# Patient Record
Sex: Female | Born: 1950
Health system: Southern US, Community
[De-identification: ages and names within clinical notes are randomized; demographics above are authoritative.]

## PROBLEM LIST (undated history)

## (undated) DIAGNOSIS — K219 Gastro-esophageal reflux disease without esophagitis: Secondary | ICD-10-CM

## (undated) DIAGNOSIS — I10 Essential (primary) hypertension: Secondary | ICD-10-CM

## (undated) DIAGNOSIS — R42 Dizziness and giddiness: Secondary | ICD-10-CM

## (undated) DIAGNOSIS — F419 Anxiety disorder, unspecified: Secondary | ICD-10-CM

## (undated) DIAGNOSIS — N289 Disorder of kidney and ureter, unspecified: Secondary | ICD-10-CM

## (undated) DIAGNOSIS — B6790 Echinococcosis, unspecified: Secondary | ICD-10-CM

## (undated) DIAGNOSIS — M75101 Unspecified rotator cuff tear or rupture of right shoulder, not specified as traumatic: Secondary | ICD-10-CM

## (undated) DIAGNOSIS — F32A Depression, unspecified: Secondary | ICD-10-CM

## (undated) DIAGNOSIS — F329 Major depressive disorder, single episode, unspecified: Secondary | ICD-10-CM

## (undated) DIAGNOSIS — H269 Unspecified cataract: Secondary | ICD-10-CM

## (undated) DIAGNOSIS — F319 Bipolar disorder, unspecified: Secondary | ICD-10-CM

## (undated) DIAGNOSIS — M810 Age-related osteoporosis without current pathological fracture: Secondary | ICD-10-CM

## (undated) HISTORY — DX: Unspecified cataract: H26.9

## (undated) HISTORY — DX: Major depressive disorder, single episode, unspecified: F32.9

## (undated) HISTORY — DX: Essential (primary) hypertension: I10

## (undated) HISTORY — DX: Disorder of kidney and ureter, unspecified: N28.9

## (undated) HISTORY — DX: Age-related osteoporosis without current pathological fracture: M81.0

## (undated) HISTORY — PX: BUNIONECTOMY: SHX129

## (undated) HISTORY — DX: Dizziness and giddiness: R42

## (undated) HISTORY — DX: Bipolar disorder, unspecified: F31.9

## (undated) HISTORY — PX: OTHER SURGICAL HISTORY: SHX169

## (undated) HISTORY — DX: Anxiety disorder, unspecified: F41.9

## (undated) HISTORY — PX: CATARACT EXTRACTION: SUR2

## (undated) HISTORY — DX: Echinococcosis, unspecified: B67.90

## (undated) HISTORY — DX: Depression, unspecified: F32.A

## (undated) HISTORY — PX: ROTATOR CUFF REPAIR: SHX139

---

## 1984-06-09 HISTORY — PX: OTHER SURGICAL HISTORY: SHX169

## 1998-10-05 ENCOUNTER — Other Ambulatory Visit: Admission: RE | Admit: 1998-10-05 | Discharge: 1998-10-05 | Payer: Self-pay | Admitting: Obstetrics and Gynecology

## 2000-01-13 ENCOUNTER — Inpatient Hospital Stay (HOSPITAL_COMMUNITY): Admission: EM | Admit: 2000-01-13 | Discharge: 2000-01-14 | Payer: Self-pay | Admitting: Internal Medicine

## 2000-01-13 ENCOUNTER — Encounter: Payer: Self-pay | Admitting: Internal Medicine

## 2001-05-26 ENCOUNTER — Other Ambulatory Visit: Admission: RE | Admit: 2001-05-26 | Discharge: 2001-05-26 | Payer: Self-pay | Admitting: Obstetrics and Gynecology

## 2002-03-31 ENCOUNTER — Encounter: Payer: Self-pay | Admitting: *Deleted

## 2002-03-31 ENCOUNTER — Ambulatory Visit (HOSPITAL_COMMUNITY): Admission: RE | Admit: 2002-03-31 | Discharge: 2002-03-31 | Payer: Self-pay | Admitting: *Deleted

## 2002-06-10 ENCOUNTER — Other Ambulatory Visit: Admission: RE | Admit: 2002-06-10 | Discharge: 2002-06-10 | Payer: Self-pay | Admitting: Obstetrics and Gynecology

## 2003-03-09 ENCOUNTER — Other Ambulatory Visit (HOSPITAL_COMMUNITY): Admission: RE | Admit: 2003-03-09 | Discharge: 2003-03-10 | Payer: Self-pay | Admitting: Psychiatry

## 2003-07-04 ENCOUNTER — Other Ambulatory Visit: Admission: RE | Admit: 2003-07-04 | Discharge: 2003-07-04 | Payer: Self-pay | Admitting: Obstetrics and Gynecology

## 2004-07-04 ENCOUNTER — Other Ambulatory Visit: Admission: RE | Admit: 2004-07-04 | Discharge: 2004-07-04 | Payer: Self-pay | Admitting: Obstetrics and Gynecology

## 2005-04-15 ENCOUNTER — Encounter: Admission: RE | Admit: 2005-04-15 | Discharge: 2005-04-15 | Payer: Self-pay | Admitting: Specialist

## 2005-05-20 ENCOUNTER — Encounter: Admission: RE | Admit: 2005-05-20 | Discharge: 2005-05-20 | Payer: Self-pay | Admitting: Specialist

## 2005-07-07 ENCOUNTER — Other Ambulatory Visit: Admission: RE | Admit: 2005-07-07 | Discharge: 2005-07-07 | Payer: Self-pay | Admitting: Obstetrics and Gynecology

## 2005-08-05 ENCOUNTER — Other Ambulatory Visit: Admission: RE | Admit: 2005-08-05 | Discharge: 2005-08-05 | Payer: Self-pay | Admitting: Obstetrics and Gynecology

## 2006-02-03 ENCOUNTER — Other Ambulatory Visit: Admission: RE | Admit: 2006-02-03 | Discharge: 2006-02-03 | Payer: Self-pay | Admitting: Obstetrics and Gynecology

## 2006-06-27 ENCOUNTER — Encounter: Admission: RE | Admit: 2006-06-27 | Discharge: 2006-06-27 | Payer: Self-pay | Admitting: *Deleted

## 2006-07-16 ENCOUNTER — Other Ambulatory Visit: Admission: RE | Admit: 2006-07-16 | Discharge: 2006-07-16 | Payer: Self-pay | Admitting: Obstetrics and Gynecology

## 2006-09-01 ENCOUNTER — Ambulatory Visit: Payer: Self-pay | Admitting: Internal Medicine

## 2006-09-01 ENCOUNTER — Inpatient Hospital Stay (HOSPITAL_COMMUNITY): Admission: EM | Admit: 2006-09-01 | Discharge: 2006-09-06 | Payer: Self-pay | Admitting: Emergency Medicine

## 2006-09-08 ENCOUNTER — Emergency Department (HOSPITAL_COMMUNITY): Admission: EM | Admit: 2006-09-08 | Discharge: 2006-09-09 | Payer: Self-pay | Admitting: Emergency Medicine

## 2007-09-02 ENCOUNTER — Ambulatory Visit (HOSPITAL_COMMUNITY): Admission: RE | Admit: 2007-09-02 | Discharge: 2007-09-02 | Payer: Self-pay | Admitting: Cardiology

## 2007-09-09 ENCOUNTER — Other Ambulatory Visit: Admission: RE | Admit: 2007-09-09 | Discharge: 2007-09-09 | Payer: Self-pay | Admitting: Obstetrics and Gynecology

## 2008-09-07 ENCOUNTER — Encounter: Payer: Self-pay | Admitting: Obstetrics and Gynecology

## 2008-09-07 ENCOUNTER — Other Ambulatory Visit: Admission: RE | Admit: 2008-09-07 | Discharge: 2008-09-07 | Payer: Self-pay | Admitting: Obstetrics and Gynecology

## 2008-09-07 ENCOUNTER — Ambulatory Visit: Payer: Self-pay | Admitting: Obstetrics and Gynecology

## 2009-03-16 ENCOUNTER — Encounter: Admission: RE | Admit: 2009-03-16 | Discharge: 2009-03-16 | Payer: Self-pay | Admitting: Specialist

## 2009-04-16 ENCOUNTER — Ambulatory Visit (HOSPITAL_COMMUNITY): Admission: RE | Admit: 2009-04-16 | Discharge: 2009-04-17 | Payer: Self-pay | Admitting: Specialist

## 2009-09-10 ENCOUNTER — Ambulatory Visit: Payer: Self-pay | Admitting: Obstetrics and Gynecology

## 2009-09-10 ENCOUNTER — Other Ambulatory Visit: Admission: RE | Admit: 2009-09-10 | Discharge: 2009-09-10 | Payer: Self-pay | Admitting: Obstetrics and Gynecology

## 2009-09-12 ENCOUNTER — Ambulatory Visit: Payer: Self-pay | Admitting: Obstetrics and Gynecology

## 2010-09-11 LAB — BASIC METABOLIC PANEL
BUN: 20 mg/dL (ref 6–23)
Calcium: 9.8 mg/dL (ref 8.4–10.5)
Creatinine, Ser: 1.02 mg/dL (ref 0.4–1.2)
GFR calc non Af Amer: 56 mL/min — ABNORMAL LOW (ref >60)
Glucose, Bld: 99 mg/dL (ref 70–99)
Potassium: 3.1 mEq/L — ABNORMAL LOW (ref 3.5–5.1)

## 2010-09-11 LAB — CBC
Hemoglobin: 13 g/dL (ref 12.0–15.0)
MCHC: 33.8 g/dL (ref 30.0–36.0)
MCV: 90.1 fL (ref 78.0–100.0)
Platelets: 166 10*3/uL (ref 150–400)
RBC: 4.28 MIL/uL (ref 3.87–5.11)
RDW: 12.6 % (ref 11.5–15.5)

## 2010-09-11 LAB — HEPATIC FUNCTION PANEL
ALT: 68 U/L — ABNORMAL HIGH (ref 0–35)
Alkaline Phosphatase: 58 U/L (ref 39–117)
Bilirubin, Direct: 0.1 mg/dL (ref 0.0–0.3)
Indirect Bilirubin: 0.6 mg/dL (ref 0.3–0.9)
Total Bilirubin: 0.7 mg/dL (ref 0.3–1.2)

## 2010-09-20 ENCOUNTER — Encounter (INDEPENDENT_AMBULATORY_CARE_PROVIDER_SITE_OTHER): Payer: Medicare Other | Admitting: Obstetrics and Gynecology

## 2010-09-20 DIAGNOSIS — N952 Postmenopausal atrophic vaginitis: Secondary | ICD-10-CM

## 2010-09-20 DIAGNOSIS — M810 Age-related osteoporosis without current pathological fracture: Secondary | ICD-10-CM

## 2010-09-20 DIAGNOSIS — N951 Menopausal and female climacteric states: Secondary | ICD-10-CM

## 2010-10-25 NOTE — Consult Note (Signed)
NAMELAQUETTA, RACEY NO.:  [id number]   MEDICAL RECORD NO.:  [id number]          PATIENT TYPE:  INP   LOCATION:  6713                         FACILITY:  [facility]   PHYSICIAN:  Antonietta Breach, M.D.  DATE OF BIRTH:  Jul 01, 1950   DATE OF CONSULTATION:  09/04/2006  DATE OF DISCHARGE:                                 CONSULTATION   Kelli Morales is having a slight increase in thought speed with a small  amount of tangentiality.  She is sexually appropriate.  She is not  depressed.  Her mood is within normal limits.  Her concentration is  within normal limits.  She is not having any thoughts of harming herself  or others.  She has intact interests and intact hope.  She has no  hallucinations or delusions.   In reviewing the past psychiatric history, the patient concurs with her  husband's report that the onset of patchy memory and periods of  confusion coincides with the beginning of the medication change  approximately 2 months ago.  Depakote was started at that time.  While  she was on the maintenance regimen of Seroquel 100 mg q.h.s., Klonopin  1.5 mg t.i.d.  Her Topamax and Lamictal 300 mg daily.  She was started  on Depakote at that time.   LABORATORY DATA:  BUN 2, creatinine 0.83, calcium 8.1.  WBC 6.9,  hemoglobin 9.4, platelet count 125.   PHYSICAL EXAMINATION:  VITAL SIGNS:  Temperature 98.3, pulse 83,  respirations 20, blood pressure 94/61, O2 saturation on room air 96%.  MENTAL STATUS:  Kelli Morales is alert.  She is oriented to all spheres.  Her speech involves slight pressure at times during the interview.  Her  memory is intact to immediate, recent, and remote, except for the period  of delirium.  Also, is impaired for periods of time over the past  several weeks.  She does not recall leaving the stove on at home,  although she has insight to understand that that was a period of  anterograde amnesia.  Her judgment is intact.  Thought process and  content as  above.  She is socially appropriate.  Her general psychomotor  tone is within normal limits.   ASSESSMENT:   AXIS I:  1. Bipolar I disorder, mixed, currently stable.  2. Delirium, 293.00, due to medication toxicity.   The undersigned provided extensive ego-supportive psychotherapy and  education.  The indications, alternatives, and adverse effects of  Seroquel, Lamictal, and Klonopin were reviewed including the risk of  Seroquel hyperglycemia and non-reversible movement disorder.  The  discussion also included the risk of a lethal rash with Lamictal and the  risk of dependence with Klonopin.   The patient understands the above information and wants to restart  Seroquel, Lamictal, and Klonopin.   RECOMMENDATIONS:  1. Would restart Seroquel at 100 mg p.o. q.h.s.  Her QTC is currently      below 450 msec.  Would increase the Seroquel by 50 mg daily while      monitoring the QTC to an estimated initial preventative dose of 200  mg q.h.s.  This initial increased dose will be applied as an      additional preventative measure since the Depakote will not be      restarted yet.  Would monitor the QTC while the Seroquel is      increased and if it exceeds 500 msec, would stop the Seroquel.  2. Would restart Lamictal at 25 mg q.a.m. and titrate in the usual      conservative fashion to the estimated preventative dosage of 100 mg      b.i.d.  3. Would restart the Klonopin as a p.r.n. regimen for now.  However,      it is anticipated that she will end up on a standing regimen of      Klonopin for mood stabilization augmentation as well as      antianxiety.  Would not start the Klonopin standing yet in order to      be cautious about inducing any memory and cognitive deficit.  4. Would have this patient see her outpatient psychiatrist during the      first week of discharge.  She is psychiatrically cleared for      discharge.  5. Would also ask the patient to release this dictation and  the other      consultation dictations by the undersigned to her outpatient      psychiatrist for her followup appointment.  6. The patient agrees to call her emergency number immediately for any      distress, clouding of consciousness, thoughts of harming herself or      others.  Her husband is also aware of emergency services for any      side effects or symptoms that re-develop.  7. Regarding the patient's Topamax, please see the previous discussion      and the initial consult.  The Topamax may be necessary for migraine      prevention.  However, would not re-add it yet given that the exact      combination of medications that synergized to put this patient in      delirium is still unclear.  Would set this patient up with      neurology followup during her first week of discharge.  8. Regarding additional mood stabilization therapy, there are      alternative regimens for prevention for this patient that can be      pursued by her outpatient psychiatrist.  Seroquel is an ideal agent      to adjust acutely in the short run while a more definitive, first      line, long term mood stabilization therapy can be developed,      eventually allowing the reduction of the Seroquel back to the      lowest effective preventative dose.      Antonietta Breach, M.D.  Electronically Signed     JW/MEDQ  D:  09/05/2006  T:  09/05/2006  Job:  [id number]

## 2010-10-25 NOTE — Consult Note (Signed)
Kelli, Morales NO.:  [id number]   MEDICAL RECORD NO.:  [id number]          PATIENT TYPE:  INP   LOCATION:  2108                         FACILITY:  [facility]   PHYSICIAN:  Antonietta Breach, M.D.  DATE OF BIRTH:  13-Jun-1950   DATE OF CONSULTATION:  09/02/2006  DATE OF DISCHARGE:                                 CONSULTATION   REQUESTING PHYSICIAN:  Charlcie Cradle. Delford Field, MD, FCCP   REASON FOR CONSULTATION:  Overdose, history of bipolar disorder by  record, delirium, rule out intentional overdose, evaluation and  management.   HISTORY OF PRESENT ILLNESS:  Kelli Morales is a 60 year old female  admitted to [facility] on September 01, 2006, due to severe stupor  leading to obtundation.  The husband had found the patient unresponsive  on the couch with vomit in her mouth labored breathing.  She required  intubation.   After being brought into the hospital, the patient has continued with  paranoia.  She also has impaired short-term memory and disorientation to  all spheres.  Her mood is depressed.  Her concentration is poor.  She is  easily distractible.  She has cloudiness of consciousness.   The patient's psychotropic medication as an outpatient has currently  been Seroquel 100 mg nightly, Klonopin 1-1/2 tablets b.i.d., Lamictal  200 mg q.a.m. and 100 mg nightly, Ambien 10 mg nightly, Depakote 1000 mg  nightly, Topamax 150 mg daily.  The Topamax has been for her headaches.   PAST PSYCHIATRIC HISTORY:  The patient was not able to provide any  history.  The history was taken from the patient's husband.  He reports  that she was diagnosed with systemic lupus erythematosus a number of  years ago.  This resulted in her having to leave her job.  She went  through a period of several weeks of irritability as well as paranoia.  He denies that she had any increased energy or decreased need for sleep.   She was treated with psychotropic medication and eventually  stabilized,  however, he states that her memory has always been patchy since that  time.   She does not have a history of psychiatric admissions or suicide  attempts.  He does not know of any history of hallucinations.   There has been a progression in her mental status abnormalities over the  past 6 weeks, particularly in the area of memory.  Approximately 6 weeks  ago, her medications were changed.  The husband cannot be specific at  this time about which ones were changed and it is unclear if this is  simply a correlation.  However, at that time, she began to not make  sense at times with her thought process.  She would get letters of words  mixed up when she would make a shopping list.  She also began to have  significant and severe short-term memory impairment.  A number of times,  she has left the stove on.  When she has tried to cook a dinner  recently, she got the ingredients all mixed up.   Also, over the  past 3 weeks, she has had a high amount of irrational  anger and paranoia thinking that others are against her.  She has been  stating that people in her life are trying to control her including her  mother, her mother-in-law and her husband.  She has also accused her  husband of adultery when there were no indications in real life that  this was occurring.   The patient did see her psychiatrist just before admission.  Her husband  reports that the psychiatrist stated that she was taking her medicine  incorrectly.  Evidently, the medicines reported above were not being  taken according to the psychiatrist's recommended regimen.   FAMILY PSYCHIATRIC HISTORY:  None known.   SOCIAL HISTORY:  Kelli Morales has a daughter.  She is married.  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  She does not use any alcohol or illegal drugs.  Her religion is  [employer] traffic [employer].   PAST MEDICAL HISTORY:  1. Systemic lupus erythematosus.  2. Delirium.  3.  Gastroesophageal reflux disease.  4. History of migraine headaches.  5. History of asthma.  6. Allergic rhinitis.   MEDICATIONS:  The MAR is reviewed.  The patient is not currently on any  psychotropic medication other than Ativan 1-4 mg IV q.1 h. p.r.n.  agitation.   ALLERGIES:  NO KNOWN DRUG ALLERGIES.   LABORATORY DATA AND X-RAY FINDINGS:  WBC is 7.3, hemoglobin 10.4,  platelet count 94.  ESR 2.  INR 1.3.  Metabolic panel shows a sodium at  146, potassium 3, chloride 116, glucose 90, BUN 5, creatinine 0.81.  SGOT 24, SGPT 17, total protein 4.8, albumin 2.3, calcium 7.6, magnesium  2.1.  Urine pregnancy negative.  Tylenol negative.  Aspirin negative.  Her Depakote level was 48.1.  Urine drug screen is positive for  tricyclic compounds as well as benzodiazepines.  Alcohol was negative.  Rheumatoid factor less than 20 which is within normal limits.  Antinuclear antibody negative.  Streptococcus pneumoniae negative.   Head CT without contrast on March 25, showed no acute intracranial  findings.   REVIEW OF SYSTEMS:  CONSTITUTIONAL:  There is a fever of 102.4 at the  time of exam.  NEUROLOGIC:  Unremarkable.  PSYCHIATRIC:  As above.  CARDIOVASCULAR:  No chest pain.  The patient's QTC on telemetry  currently is 410.  RESPIRATORY:  No current coughing or wheezing.  The  patient is on 4 L of oxygen nasal cannula.  GASTROINTESTINAL:  No  nausea, vomiting, diarrhea.  GENITOURINARY:  No known dysuria.  SKIN:  Unremarkable.  MUSCULOSKELETAL:  No deformities, weaknesses or atrophy.  ENDOCRINE/METABOLIC:  As above.  HEMATOLOGIC/LYMPHATIC:  Mild anemia,  thrombocytopenia.   PHYSICAL EXAMINATION:  VITAL SIGNS:  Temperature 102.4, pulse 87 blood  pressure 118/60, O2 saturation on 4 L nasal cannula oxygen 100%.   MENTAL STATUS EXAM:  Kelli Morales is a well-groomed, middle-aged female  partially reclined in a supine position in her ICU bed with clouding of consciousness.  Her orientation is  only to self.  Her memory involves  3/3 immediate, 0/3 on recall.  Thought process involves partial  disorganization.  Thought content:  She states that she is being  controlled by her mother, her mother-in-law and her husband.  She has no  current hallucinations.  There are no thoughts of harming herself or  others.  Her affect is constricted.  Her mood is sad.  Judgment is  impaired.  Insight is poor.  Concentration is poor with easy  distractibility.  Speech involves slightly flat prosody and slightly  slowed rate.  Her fund of knowledge and intelligence are below that of  her estimated premorbid baseline.  Her general psychomotor tone is  mildly slowed.   ASSESSMENT:  AXIS I:  1.  (293.00) Delirium, not otherwise specified.  1. Rule out (293.83) mood disorder, not otherwise specified.  The      patient's husband does not give a history consistent with a      hypomanic her manic period.  However, this diagnosis will be left      provisional.  It does appear that the patient has experience a      history of functional depressive symptoms as well as paranoia.      However, the organic type findings are reported by the husband as      occurring over several weeks.  AXIS II:  Deferred.  AXIS III:  See general medical problems.  AXIS IV:  General medical.  AXIS V:  20.   DISCUSSION:  Looking over the patient's preadmission psychotropics, a  number of the medicines could cause delirium if not taken in the correct  dosages, particularly if incorrect dosages were combining together in  producing a delirium.   Topamax has particularly been known to cause memory impairment as well  as psychosis in a small percentage of patients.  Klonopin obviously can  impair memory.  Also, Ambien can induce a delirium condition.  The  patient was also on Flexeril and it has anticholinergic side effects  which can lower the delirium threshold.  Of note, also the patient's  albumin is currently below  normal limits on two separate laboratory  assessments indicating that the patients drug binding in the serum for a  number of agents would be inadequate resulting in potential toxicity at  normal dosing.   RECOMMENDATIONS:  1. Concur with discontinuing all psychotropics as possible at this      time except for a psychotropic for severe agitation.  2. Concur with Ativan 1-4 mg q.1 h. p.r.n. agitation.  3. If confusion and paranoia continue, would start Haldol 0.5 mg p.o.      or slow push IV b.i.d. while monitoring her QTC.  Psychiatry will      follow the patient's routine psychotropic regimen.  Will have to be      developed as her constitutional status improves.      Antonietta Breach, M.D.  Electronically Signed     JW/MEDQ  D:  09/02/2006  T:  09/03/2006  Job:  [id number]

## 2010-10-25 NOTE — Op Note (Signed)
NAMEAIMY, SWEETING NO.:  [id number]   MEDICAL RECORD NO.:  [id number]          PATIENT TYPE:  OIB   LOCATION:  2899                         FACILITY:  [facility]   PHYSICIAN:  Armanda Magic, M.D.     DATE OF BIRTH:  1950-09-11   DATE OF PROCEDURE:  DATE OF DISCHARGE:  09/02/2007                               OPERATIVE REPORT   REFERRING PHYSICIAN:  Dr. Santiago Glad   PROCEDURE:  Tilt table test.   OPERATOR:  Armanda Magic, M.D.   INDICATIONS:  Syncope.   HISTORY OF PRESENT ILLNESS:  This is a 60 year old female who has had  syncope.  She was evaluated by Dr. Verdis Prime.  She does have a history  of bipolar disorder, is on multiple medications.  She also has a history  of migraine headaches.  She is now here for tilt table testing to rule  out vasovagal or orthostatic hypotension as a cause for syncope.   The patient was brought to the cardiac catheterization laboratory in a  fasting, nonsedated state.  Informed consent was obtained.  The patient  was connected to continuous heart rate and pulse oximetry monitoring and  intermittent blood pressure monitoring.  The patient was placed supine  and baseline blood pressure was 108/54 with a heart rate of 55.  The  patient was then tilted upright for a total of 30 minutes.  There were  no significant changes noted in her blood pressure during the upright  tilt.  Her lowest blood pressure during upright tilt was 98/67 mmHg.  Average heart rate was in the 60s.  She was then placed supine and  started on Isuprel drip 0.5 mcg/kg and then increased to a total of 2  mcg/kg to attain increase in heart rate of 20% from baseline.  The  patient was then tilted back up for a total of 10 minutes on Isuprel  drip.  Lowest blood pressure achieved during that was 91/34 mmHg.  She  had no symptoms.  She was subsequently placed supine and later  discharged home.   IMPRESSION:  1. Syncope of unclear etiology.  2. Negative tilt  table test for vasovagal orthostatic hypotension.      Blood pressures did drop in the low 90 systolic,      but she only started up with a blood pressure of 108 systolic to      begin with.  She was completely asymptomatic.   Recommendations:  Refer to workup per Dr. Verdis Prime.      Armanda Magic, M.D.  Electronically Signed     TT/MEDQ  D:  09/23/2007  T:  09/24/2007  Job:  [id number]   cc:   Lyn Records, M.D.  Santiago Glad

## 2010-10-25 NOTE — Discharge Summary (Signed)
Kelli Morales, Kelli Morales                ACCOUNT NO.:  [id number]   MEDICAL RECORD NO.:  [id number]          PATIENT TYPE:  INP   LOCATION:  6713                         FACILITY:  [facility]   PHYSICIAN:  Kela Millin, M.D.DATE OF BIRTH:  26-Dec-1950   DATE OF ADMISSION:  09/01/2006  DATE OF DISCHARGE:  09/06/2006                               DISCHARGE SUMMARY   DISCHARGE DIAGNOSES:  1. Accidental drug overdose.  2. Ventilator-dependent respiratory failure, likely secondary to acute      lung injury due to aspiration.  3. Staphylococcus aureus pneumonia with systemic inflammatory response      syndrome/hypotension.  4. Hypokalemia - resolved.  5. Delirium, not otherwise specified.  6. Bipolar 1 disorder, mixed - Per psychiatrist, currently stable.   PROCEDURES STUDIES:  1. Left internal jugular central line placed on September 01, 2006,      removed September 04, 2006.  2. Endotracheal intubation on September 01, 2006, extubated September 02, 2006.  3. Arterial line placed September 01, 2006, and removed September 02, 2006.  4. CT scan of head - no acute intracranial findings.   HISTORY:  The patient is a pleasant 60 year old black female with a past  medical history significant for bipolar disorder, asthma, systemic  lupus, on multiple medications, who presented to the [facility] ER with  altered mental status and respiratory distress.  Per family, she had  been confused for the past couple of months and lethargic.  It was noted  that patient is followed by psychiatrist - Dr. Jennelle Morales and the day prior  to admission had seen him and he decided to decrease her Seroquel,  Klonopin and Lamictal doses.  The patient's husband woke up on the  morning of admission and found her unresponsive on the couch with  vomitus in her mouth and her breathing was labored.  She was intubated  per EMS and brought to [facility].  The patient was admitted to the  Critical Care Service.   Her physical exam upon admission, as  per Dr. Frederico Morales, revealed a  temperature of 93 with a blood pressure of 76/41, pulse of 59, O2 sat of  100%.  GENERAL:  She was sedated and on the vent.  PERTINENT FINDINGS ON EXAM:  CARDIOVASCULAR:  Bradycardic, regular rhythm.  EXTREMITIES:  No edema.  NEUROLOGIC AND PSYCHOLOGIC:  Limited because patient was intubated and  sedated.   LABORATORY DATA:  Her sodium was 136, potassium of 2.4, chloride 100,  bicarbonate of 27, BUN 13, creatinine of 1.2, glucose 129, hemoglobin of  11.8, white cell count of 7.1, platelet count 90,000, neutrophil count  83, alcohol level less than 5.  Urinalysis negative for infection.  Urine drug screen positive for benzodiazepines.  Urine tricyclic screen  positive.  ABG showed a pH of 7.42, pCO2 of 34.2, pO2 of 260,  bicarbonate of 22.3, O2 sat of 100% and the FIO2 100%, valproic acid  level 48.1.  Urine pregnancy test negative.  Chest x-ray showed airspace  disease bilaterally, right greater than left, and predominantly  perihilar.   HOSPITAL  COURSE:  1. Ventilator-dependent respiratory failure, likely secondary to acute      lung injury due to aspiration - The patient was admitted to the      Critical Care Service.  She was started on ARDS protocol with a      goal tidal volume of 6 mL/kg and her FIO2 titrated to maintain O2      sats of greater than 92%.  The patient was placed on albuterol and      Atrovent nebulizers.  She was also placed on empiric broad spectrum      antibiotics.  Bronchial alveolar lavage was done with patient on      the vent and the sputum sent for cultures and it grew Staph aureus,      her antibiotics were adjusted as appropriate.  The patient      responded well to this intervention and was weaned per protocol and      on March 26 was extubated.  She tolerated this well and stayed off      the ventilator.  She was subsequently transferred to a medical      floor bed and has been oxygenating well on room air.  Followup       chest x-ray showed improving infiltrates.  2. Accidental overdose - Following extubation, Psychiatry was      consulted and Dr. Jeanie Morales saw the patient.  He agreed with Ativan      p.r.n. agitation and also agreed with eliminating as many      psychotropics as possible initially.  He subsequently followed up      with the patient and restarted Lamictal at a lower dose of 25 mg a      day and also the Seroquel at 100 mg q.h.s. and Klonopin 1 mg t.i.d.      p.r.n.  An EKG was done after the patient was placed back on      Seroquel to assess the QT interval and this was less than 500-449.      Dr. Jeanie Morales also recommended increasing the Seroquel by 50 mg      daily while monitoring the QTc to an estimated initial preventative      dose of 200 mg q.h.s.  The patient is discharged on 150 mg of      Seroquel and the dose to be increased by 50 on followup with his      psychiatrist with EKG monitoring - QTc interval as recommended.      Dr. Jeanie Morales explained that this initial increased dose will be      applied as an additional preventative measure since the Depakote      will not be restarted yet.  He also indicated that if the QTc      interval exceed 500 milliseconds the Seroquel should be stopped.      He also, as already indicated, restarted the Lamictal 25 mg daily      and indicated that it be titrated in the usual conservative fashion      to an estimated preventative dosage of 100 mg p.o. b.i.d., the      patient is discharged on 25 mg and is to follow up with the      psychiatrist - she is to call for an appointment in the morning and      the psychiatrist will titrate this Lamictal.  Also, the clonidine      was started p.r.n. and he indicated that  he did not recommend      starting scheduled Klonopin yet, in order to be cautious about      inducing any memory or cognitive deficit.  The patient is to follow     up with the outpatient psychiatrist as already discussed, Dr.       Jennelle Morales, the first week of discharge and Dr. Jeanie Morales cleared the      patient for discharge.  The psychiatrist also recommended stopping      the Topamax, and the patient is to follow up with her neurologist -      is to call in a.m. for appointment.  Ms. Wemhoff' mental status has      improved and on rounds today her family indicates that she is at      about her baseline at this time and she will be discharged for      outpatient followup.  3. Staph aureus pneumonia - Patient had a chest x-ray done and the      results are as stated above, she was started on empiric antibiotics      on admission and a BAL was done and the cultures grew Staph aureus      which was sensitive to oxacillin.  The patient has remained      afebrile.  Followup chest x-ray has shown improvement in the      infiltrates.  She is oxygenating well on room air and will be      discharged on oral antibiotics and is to follow up with her primary      care physician.  4. Bipolar disorder/delirium - Improved, patient was followed by      Psychiatry, as discussed above, while in the hospital.  5. History of asthma - Patient is to continue her bronchodilators as      well as Singulair, Flonase and Zyrtec upon discharge.  6. Hypokalemia - Resolved.  The patient's last potassium prior to      discharge was 3.5.   DISCHARGE MEDICATIONS:  1. Lamictal 25 mg one p.o. daily - dose to be titrated per      psychiatrist as discussed above.  2. Seroquel 150 mg p.o. q.h.s., dose also to be titrated per      psychiatrist with monitoring of the QTc interval to a dose of 200      mg (patient now off Depakote as discussed above).  3. Augmentin 875 mg one p.o. b.i.d. for 1 week.  4. Klonopin 1 mg p.o. t.i.d. p.r.n.  5. Patient to continue Aciphex, Advair, ProAir, Zyrtec, multivitamin,      Singulair, calcium, Flonase, ibuprofen/Tylenol p.r.n., Phenergan      p.r.n.  6. As discussed above, discontinued Depakote, Topamax,  tizanidine,      Flexeril.  7. Patient is to hold off Maxzide until blood pressures rechecked upon      followup with her primary care physician next week, patient is to      call for an appointment in the a.m.  The patient's blood pressures      have been mostly systolic 106-111 off Maxzide while in the      hospital.   FOLLOWUP CARE:  1. Dr. Jennelle Morales next week, patient to call for appointment in a.m.  2. Neurologist, patient to call for appointment in a.m. for an      appointment next week.  3. Dr. Laurann [place] next week, patient to call for appointment.   DISCHARGE CONDITION:  Improved/stable.  Kela Millin, M.D.  Electronically Signed     ACV/MEDQ  D:  09/06/2006  T:  09/06/2006  Job:  [id number]   cc:   Stacie Acres. Cliffton Asters, M.D.  [facility]  Jetty Duhamel., M.D.

## 2010-10-25 NOTE — H&P (Signed)
NAMEJAYCI, ELLEFSON NO.:  [id number]   MEDICAL RECORD NO.:  [id number]          PATIENT TYPE:  INP   LOCATION:  1823                         FACILITY:  [facility]   PHYSICIAN:  Vanetta Mulders, MD         DATE OF BIRTH:  Jan 21, 1951   DATE OF ADMISSION:  09/01/2006  DATE OF DISCHARGE:                              HISTORY & PHYSICAL   HISTORY OF PRESENT ILLNESS:  Mrs. Hardgrave is a 60 year old lady with a  past medical history significant for bipolar disorder, systemic lupus  erythematosus diagnosed in 2001, on polypharmacia at home who presented  to [facility] Emergency Room for altered mental status and  respiratory distress.  Patient has been lethargic and confused for the  past couple of months, per husband's history.  She is seeing a  psychiatrist, Dr. Chancy Hurter, and patient states that the day before  admission patient has seen her psychiatrist and he decided to decrease  her medication her Seroquel, her Klonopin, and Lamictal.  The night  prior to admission the patient went to the living room to watch TV.  In  the morning of admission patient's husband woke up and found her  unresponsive on the couch with vomit on her mouth and having laborious  breathing.  She was breathing on her own, and she had a pulse.  He  called EMS.  Because of respiratory distress EMS intubated the patient  in the field and brought her into [facility].   ALLERGIES:  Unknown.   PAST MEDICAL HISTORY:  1. Significant for bipolar disorder.  2. Migraine headaches.  3. Systemic lupus erythematosus.  4. GERD.  5. Asthma.  6. Allergic rhinitis.   MEDICATIONS:  1. Seroquel 100 mg at bedtime.  2. Klonopin 1-1/2 tablets p.o. b.i.d.  3. Lamictal 200 mg daily and 100 mg a night.  This was changed to 1-      1/2 daily either a.m. or p.m.  4. Ambien 10 mg at bedtime.  This was stopped the day prior to      admission.  5. Maxzide 75/50 p.o. daily.  6. AcipHex 20 mg daily.  7.  Advair 500/50 one inhaled in the a.m. and one in the p.m.  8. Zyrtec 10 mg p.o. daily.  9. Singulair 10 mg p.o. nightly.  10.Multivitamin 2 tablets daily.  11.Calcium 600 Plus vitamin D 200 p.o. b.i.d.  12.ProAir HFA 2 puffs q.4 hours p.r.n.  13.Flonase 1 spray each nostril every 12 hours p.r.n.  14.Aspirin 500 mg p.o. q.6 hours p.r.n.  15.Tylenol Extra Strength 500 mg p.o. q.6 hours p.r.n.  16.Ibuprofen 800 mg p.o. t.i.d. for back pain p.r.n.  17.Flexeril using the _______.  18.Tizanidine 4 mg p.o. daily.  Can be taken as often as 4 times a      day.  19.Depakote 500 mg 2 tablets at bedtime.  20.Topamax 150 mg tablets taken for migraine headaches at bedtime.  21.Promethazine 25 mg p.o. nightly p.r.n. with migraine headaches.  22.Stool softener 2 p.r.n.  23.Soy balance five 50 mg caplets p.o. b.i.d.  24.Green  tea 1 cup twice daily.  25.Hoodia capsules as diet aid t.i.d.   FAMILY HISTORY, SOCIAL HISTORY AND REVIEW OF SYSTEMS:  Cannot be  obtained.  Patient was intubated and sedated.   PHYSICAL EXAMINATION:  VITAL SIGNS:  Showed a temperature of 93.  Blood  pressure of 76/41.  Heart rate of 59.  O2 saturating of 100%.  GENERAL:  Patient was sedated, on the vent.  EYES:  PERRLA.  Extraocular movement could not be assessed.  CARDIOVASCULAR:  Bradycardia.  Regular respiratory force when breathing.  ABDOMINAL:  Bowel sounds negative but abdomen was soft, nontender.  No  rigidity.  EXTREMITIES:  No edema.  NEUROLOGIC:  Could not be assessed.  Patient withdrawing from painful  stimuli.  PSYCHOLOGICALLY:  Could not be assessed.  Patient intubated and sedated.   LABS ON ADMISSION:  Showed a sodium of 136. Potassium 2.4.  Chloride  100.  Bicarbonate 27.  BUN 13.  Creatinine 1.2.  Glucose 129.  Hemoglobin 11.8.  White count of 7.1.  Platelets 90 with percentage of  neutrophils 83.  Alcohol level of less than 5.  Urine was yellow, with a  specific gravity of 1.017, a pH of 6, glucose  negative, ketones 15,  protein negative, nitrite negative, leukocyte esterase negative.  Urine  drug screen was positive for benzodiazepines.  Urine tricyclic screen  was positive.  ABG showed a pH of 7.422.  PCO2 of 34.2.  PO2 of 260.  Bicarbonate of 22.3.  O2 sat 100% on FiO2 of 100%.  Valproic acid level  was 48.1 which is low.  Urine pregnancy test was negative.  Chest x-ray  showed air-space disease bilaterally, right more than left, and  predominantly perihilar.   ASSESSMENT AND PLAN:  1. Ventilator-dependent respiratory failure most likely secondary to      acute lung injury secondary to aspiration.  Patient was started on      ARDS protocol with the goal of tidal volume of 6 mL per kilogram      and respiratory therapy which was ordered to decrease the FiO2 in      order to obtain an O2 sat of more than 92%.  Patient was started on      albuterol nebulizers and Atrovent around the clock and p.r.n.      Patient was started also on antibiotics for aspiration pneumonia      and azithromycin and Zosyn.  2. Shock most likely secondary to sepsis and acute stress with      aspiration pneumonia.  Patient received aggressive fluids,      resuscitation in the emergency room approximately 4 liters of      fluids.  Her blood pressure improved significantly.  She did not      require pressors.  She has a p.r.n. order to start Levophed if      necessary.  If fluid resuscitation does not achieve the goals of      therapy, which are a arterial pressure more than 65 and the CVP      more than 10.  3. Hypokalemia.  I am unsure of etiology.  Most likely it is secondary      to poor p.o. intake.  I will check magnesium level as well and I      will repeat potassium and recheck her level in the afternoon.  4. Hypothermia most likely secondary to sepsis and septic shock.      Patient is currently on bear-hugger.  I am  sure she will improve     with aggressive resuscitation measures.  5.  Bradycardia.  It is most likely related to previous problem      hypokalemia, hypothermia and shock.  We will continue to monitor      patient.  6. Altered mental status, most likely secondary to medication and      sepsis.  In the differential I would also include lupus vasculitis.      Patient will have a CT of her head done today and will follow up on      the results.  Now patient is on the ventilator on sedation      protocol.  We will try to wean her oxidation and extubate as soon      as possible.  At that time we will reassess her mental status.  7. Bipolar disorder, on multiple medications.  Followed by Dr. Chancy Hurter.      For now we will hold patient's medications Seroquel, Klonopin and      lamotrigine, and we will continue sedation medication, fentanyl,      and Versed.  We will try to wean her off fentanyl and Versed and      likely restart her home medication at the smaller dose.  We will      consult psychiatry for medication reconsideration in the morning.  8. Migraine headaches.  For now we will hold medications.  Valproic      acid level is low.  We will continue to monitor.  9. GERD.  We will continue Protonix.  10.She has systemic lupus erythematosus.  We will check today an ANA,      double-stranded DNA, rheumatoid factor, ESR, and a CT of the head      to evaluate for vasculitis.  11.Hyperglycemia.  The patient was started on hyperglycemia protocol.  12.Prophylaxis.  Patient was started on Lovenox for DVT prophylaxis.      Vanetta Mulders, MD     DA/MEDQ  D:  09/01/2006  T:  09/01/2006  Job:  [id number]

## 2011-03-11 ENCOUNTER — Telehealth: Payer: Self-pay | Admitting: *Deleted

## 2011-03-11 NOTE — Telephone Encounter (Signed)
Patient informed note below.  She is satisfied with answer.  Will discuss further at annual exam.

## 2011-03-11 NOTE — Telephone Encounter (Signed)
Patient wants to know what your opinion is on soy supplements including side effects from using them.

## 2011-03-11 NOTE — Telephone Encounter (Signed)
That is really not a question I can answer completely on the phone. I think soy supplements to help symptoms. I think it's unlikely there is any danger with them if used in moderation. There is however no scientific data. If she wants more information I suggest she come and see me.

## 2011-07-07 DIAGNOSIS — J309 Allergic rhinitis, unspecified: Secondary | ICD-10-CM | POA: Diagnosis not present

## 2011-07-07 DIAGNOSIS — J45909 Unspecified asthma, uncomplicated: Secondary | ICD-10-CM | POA: Diagnosis not present

## 2011-07-07 DIAGNOSIS — D649 Anemia, unspecified: Secondary | ICD-10-CM | POA: Diagnosis not present

## 2011-07-07 DIAGNOSIS — I10 Essential (primary) hypertension: Secondary | ICD-10-CM | POA: Diagnosis not present

## 2011-07-07 DIAGNOSIS — N289 Disorder of kidney and ureter, unspecified: Secondary | ICD-10-CM | POA: Diagnosis not present

## 2011-07-14 DIAGNOSIS — M2010 Hallux valgus (acquired), unspecified foot: Secondary | ICD-10-CM | POA: Diagnosis not present

## 2011-07-17 DIAGNOSIS — M2010 Hallux valgus (acquired), unspecified foot: Secondary | ICD-10-CM | POA: Diagnosis not present

## 2011-07-28 DIAGNOSIS — N2581 Secondary hyperparathyroidism of renal origin: Secondary | ICD-10-CM | POA: Diagnosis not present

## 2011-07-28 DIAGNOSIS — I129 Hypertensive chronic kidney disease with stage 1 through stage 4 chronic kidney disease, or unspecified chronic kidney disease: Secondary | ICD-10-CM | POA: Diagnosis not present

## 2011-07-28 DIAGNOSIS — N269 Renal sclerosis, unspecified: Secondary | ICD-10-CM | POA: Diagnosis not present

## 2011-07-29 DIAGNOSIS — I10 Essential (primary) hypertension: Secondary | ICD-10-CM | POA: Diagnosis not present

## 2011-07-29 DIAGNOSIS — M2010 Hallux valgus (acquired), unspecified foot: Secondary | ICD-10-CM | POA: Diagnosis not present

## 2011-08-04 DIAGNOSIS — M2010 Hallux valgus (acquired), unspecified foot: Secondary | ICD-10-CM | POA: Diagnosis not present

## 2011-08-14 DIAGNOSIS — F3177 Bipolar disorder, in partial remission, most recent episode mixed: Secondary | ICD-10-CM | POA: Diagnosis not present

## 2011-08-18 ENCOUNTER — Other Ambulatory Visit: Payer: Self-pay | Admitting: Cardiology

## 2011-08-21 ENCOUNTER — Encounter (INDEPENDENT_AMBULATORY_CARE_PROVIDER_SITE_OTHER): Payer: Medicare Other

## 2011-08-21 DIAGNOSIS — I10 Essential (primary) hypertension: Secondary | ICD-10-CM | POA: Diagnosis not present

## 2011-08-22 DIAGNOSIS — Z1231 Encounter for screening mammogram for malignant neoplasm of breast: Secondary | ICD-10-CM | POA: Diagnosis not present

## 2011-08-22 DIAGNOSIS — M949 Disorder of cartilage, unspecified: Secondary | ICD-10-CM | POA: Diagnosis not present

## 2011-08-27 ENCOUNTER — Encounter: Payer: Self-pay | Admitting: Obstetrics and Gynecology

## 2011-08-28 ENCOUNTER — Encounter: Payer: Self-pay | Admitting: Obstetrics and Gynecology

## 2011-09-01 DIAGNOSIS — M2010 Hallux valgus (acquired), unspecified foot: Secondary | ICD-10-CM | POA: Diagnosis not present

## 2011-09-08 ENCOUNTER — Other Ambulatory Visit (HOSPITAL_COMMUNITY): Payer: Self-pay | Admitting: Nephrology

## 2011-09-08 DIAGNOSIS — I10 Essential (primary) hypertension: Secondary | ICD-10-CM

## 2011-09-08 DIAGNOSIS — N261 Atrophy of kidney (terminal): Secondary | ICD-10-CM

## 2011-09-15 ENCOUNTER — Encounter: Payer: Self-pay | Admitting: Gynecology

## 2011-09-15 DIAGNOSIS — J45909 Unspecified asthma, uncomplicated: Secondary | ICD-10-CM | POA: Insufficient documentation

## 2011-09-15 DIAGNOSIS — F319 Bipolar disorder, unspecified: Secondary | ICD-10-CM | POA: Insufficient documentation

## 2011-09-15 DIAGNOSIS — G43909 Migraine, unspecified, not intractable, without status migrainosus: Secondary | ICD-10-CM | POA: Insufficient documentation

## 2011-09-15 DIAGNOSIS — B6790 Echinococcosis, unspecified: Secondary | ICD-10-CM | POA: Insufficient documentation

## 2011-09-16 ENCOUNTER — Other Ambulatory Visit (HOSPITAL_COMMUNITY): Payer: Medicare Other

## 2011-09-16 ENCOUNTER — Encounter (HOSPITAL_COMMUNITY)
Admission: RE | Admit: 2011-09-16 | Discharge: 2011-09-16 | Disposition: A | Discharge status: Home / Self Care | Payer: Medicare Other | Source: Ambulatory Visit | Attending: Nephrology | Admitting: Nephrology

## 2011-09-16 DIAGNOSIS — N261 Atrophy of kidney (terminal): Secondary | ICD-10-CM

## 2011-09-16 DIAGNOSIS — N269 Renal sclerosis, unspecified: Secondary | ICD-10-CM | POA: Diagnosis not present

## 2011-09-16 DIAGNOSIS — N289 Disorder of kidney and ureter, unspecified: Secondary | ICD-10-CM | POA: Diagnosis not present

## 2011-09-16 DIAGNOSIS — I10 Essential (primary) hypertension: Secondary | ICD-10-CM | POA: Diagnosis not present

## 2011-09-16 MED ORDER — TECHNETIUM TC 99M MERTIATIDE
15.5000 | Freq: Once | INTRAVENOUS | Status: AC | PRN
  Administered 2011-09-16: 16 via INTRAVENOUS

## 2011-09-23 ENCOUNTER — Ambulatory Visit (INDEPENDENT_AMBULATORY_CARE_PROVIDER_SITE_OTHER): Payer: Medicare Other | Admitting: Obstetrics and Gynecology

## 2011-09-23 ENCOUNTER — Encounter: Payer: Self-pay | Admitting: Obstetrics and Gynecology

## 2011-09-23 ENCOUNTER — Other Ambulatory Visit (HOSPITAL_COMMUNITY)
Admission: RE | Admit: 2011-09-23 | Discharge: 2011-09-23 | Disposition: A | Discharge status: Home / Self Care | Payer: Medicare Other | Source: Ambulatory Visit | Attending: Obstetrics and Gynecology | Admitting: Obstetrics and Gynecology

## 2011-09-23 VITALS — BP 110/70 | Ht 65.0 in | Wt 135.0 lb

## 2011-09-23 DIAGNOSIS — Z124 Encounter for screening for malignant neoplasm of cervix: Secondary | ICD-10-CM | POA: Diagnosis not present

## 2011-09-23 DIAGNOSIS — N390 Urinary tract infection, site not specified: Secondary | ICD-10-CM | POA: Diagnosis not present

## 2011-09-23 DIAGNOSIS — N289 Disorder of kidney and ureter, unspecified: Secondary | ICD-10-CM | POA: Insufficient documentation

## 2011-09-23 DIAGNOSIS — M810 Age-related osteoporosis without current pathological fracture: Secondary | ICD-10-CM | POA: Diagnosis not present

## 2011-09-23 DIAGNOSIS — N952 Postmenopausal atrophic vaginitis: Secondary | ICD-10-CM | POA: Diagnosis not present

## 2011-09-23 DIAGNOSIS — F329 Major depressive disorder, single episode, unspecified: Secondary | ICD-10-CM | POA: Insufficient documentation

## 2011-09-23 DIAGNOSIS — I10 Essential (primary) hypertension: Secondary | ICD-10-CM | POA: Insufficient documentation

## 2011-09-23 DIAGNOSIS — R351 Nocturia: Secondary | ICD-10-CM

## 2011-09-23 NOTE — Progress Notes (Signed)
Patient came to see me today for further followup. She has had deterioration of her renal function with some shrinkage of her right kidney. She is in the middle of a workup with the nephrologist. We have previously treated her for osteoporosis with biphosphonate's. She found it very hard to comply with having an empty stomach prior to taking her medicine so she has been on drug holiday for 2 years. Her bone density done last month showed her worse T score of -2.4. Since 2011 there is been statistical significant decrease in bone density hip and spine. She continues to have vaginal dryness but this did not require intervention she is currently not having menopausal symptoms she is having no pelvic pain. She is having no vaginal bleeding. She will sometimes get some urinary frequency but no urgency or incontinence or dysuria. She also is treated for bipolar disease, migraines, hypertension and depression.  ROS: 12 system review done. All pertinent positives above.  Physical examination: Kennon Portela present. HEENT within normal limits. Neck: Thyroid not large. No masses. Supraclavicular nodes: not enlarged. Breasts: Examined in both sitting and lying  position. No skin changes and no masses. Abdomen: Soft no guarding rebound or masses or hernia. Pelvic: External: Within normal limits. BUS: Within normal limits. Vaginal:within normal limits. Poor estrogen effect. No evidence of cystocele rectocele or enterocele. Cervix: clean. Uterus: Normal size and shape. Adnexa: No masses. Rectovaginal exam: Confirmatory and negative. Extremities: Within normal limits.  Assessment: #1. Osteoporosis #2. Atrophic vaginitis #3. Nocturia  Plan: I told the patient that I would normally reinitiate treatment for bone loss. I think the two best choices would be Reclast or Prolia. Since she is not technically osteoporotic I do not believe we can get her approved for Prolia. Due to her current renals problems I do not think it  would be a good idea her to start Reclast. I told her I thought we should wait until her renal workup is done and she has a diagnosis to address this issue.

## 2011-09-24 LAB — URINALYSIS W MICROSCOPIC + REFLEX CULTURE
Bilirubin Urine: NEGATIVE
Glucose, UA: NEGATIVE mg/dL
Leukocytes, UA: NEGATIVE
Specific Gravity, Urine: 1.016 (ref 1.005–1.030)
Squamous Epithelial / LPF: NONE SEEN
pH: 7.5 (ref 5.0–8.0)

## 2011-09-26 ENCOUNTER — Telehealth: Payer: Self-pay | Admitting: *Deleted

## 2011-09-26 NOTE — Telephone Encounter (Signed)
Patient informed note below. 

## 2011-09-26 NOTE — Telephone Encounter (Signed)
Message copied by Libby Maw on Fri Sep 26, 2011 12:08 PM ------      Message from: Trellis Paganini      Created: Tue Sep 23, 2011  5:06 PM       Tell patient I have reviewed her bone density and still feel we should wait to treat her into we know her renal disease. We can readdress it after that.

## 2011-10-02 DIAGNOSIS — H6120 Impacted cerumen, unspecified ear: Secondary | ICD-10-CM | POA: Diagnosis not present

## 2011-10-06 DIAGNOSIS — M2010 Hallux valgus (acquired), unspecified foot: Secondary | ICD-10-CM | POA: Diagnosis not present

## 2011-10-07 DIAGNOSIS — F3177 Bipolar disorder, in partial remission, most recent episode mixed: Secondary | ICD-10-CM | POA: Diagnosis not present

## 2011-11-04 DIAGNOSIS — M2010 Hallux valgus (acquired), unspecified foot: Secondary | ICD-10-CM | POA: Diagnosis not present

## 2011-11-04 DIAGNOSIS — I10 Essential (primary) hypertension: Secondary | ICD-10-CM | POA: Diagnosis not present

## 2011-11-12 DIAGNOSIS — M2010 Hallux valgus (acquired), unspecified foot: Secondary | ICD-10-CM | POA: Diagnosis not present

## 2011-11-18 DIAGNOSIS — G43019 Migraine without aura, intractable, without status migrainosus: Secondary | ICD-10-CM | POA: Diagnosis not present

## 2011-12-04 DIAGNOSIS — R609 Edema, unspecified: Secondary | ICD-10-CM | POA: Diagnosis not present

## 2012-01-01 DIAGNOSIS — R609 Edema, unspecified: Secondary | ICD-10-CM | POA: Diagnosis not present

## 2012-01-09 DIAGNOSIS — F3177 Bipolar disorder, in partial remission, most recent episode mixed: Secondary | ICD-10-CM | POA: Diagnosis not present

## 2012-03-03 DIAGNOSIS — G248 Other dystonia: Secondary | ICD-10-CM | POA: Diagnosis not present

## 2012-03-03 DIAGNOSIS — F3177 Bipolar disorder, in partial remission, most recent episode mixed: Secondary | ICD-10-CM | POA: Diagnosis not present

## 2012-03-11 DIAGNOSIS — I129 Hypertensive chronic kidney disease with stage 1 through stage 4 chronic kidney disease, or unspecified chronic kidney disease: Secondary | ICD-10-CM | POA: Diagnosis not present

## 2012-03-16 DIAGNOSIS — Z23 Encounter for immunization: Secondary | ICD-10-CM | POA: Diagnosis not present

## 2012-04-02 DIAGNOSIS — F3177 Bipolar disorder, in partial remission, most recent episode mixed: Secondary | ICD-10-CM | POA: Diagnosis not present

## 2012-05-11 DIAGNOSIS — G43019 Migraine without aura, intractable, without status migrainosus: Secondary | ICD-10-CM | POA: Diagnosis not present

## 2012-06-17 ENCOUNTER — Emergency Department (HOSPITAL_COMMUNITY): Payer: Medicare Other

## 2012-06-17 ENCOUNTER — Encounter (HOSPITAL_COMMUNITY): Payer: Self-pay | Admitting: Emergency Medicine

## 2012-06-17 ENCOUNTER — Emergency Department (HOSPITAL_COMMUNITY)
Admission: EM | Admit: 2012-06-17 | Discharge: 2012-06-17 | Disposition: A | Discharge status: Home / Self Care | Payer: Medicare Other | Attending: Emergency Medicine | Admitting: Emergency Medicine

## 2012-06-17 DIAGNOSIS — Z79899 Other long term (current) drug therapy: Secondary | ICD-10-CM | POA: Diagnosis not present

## 2012-06-17 DIAGNOSIS — I10 Essential (primary) hypertension: Secondary | ICD-10-CM | POA: Insufficient documentation

## 2012-06-17 DIAGNOSIS — S43006A Unspecified dislocation of unspecified shoulder joint, initial encounter: Secondary | ICD-10-CM

## 2012-06-17 DIAGNOSIS — M19019 Primary osteoarthritis, unspecified shoulder: Secondary | ICD-10-CM | POA: Diagnosis not present

## 2012-06-17 DIAGNOSIS — Z87448 Personal history of other diseases of urinary system: Secondary | ICD-10-CM | POA: Diagnosis not present

## 2012-06-17 DIAGNOSIS — Z8659 Personal history of other mental and behavioral disorders: Secondary | ICD-10-CM | POA: Diagnosis not present

## 2012-06-17 DIAGNOSIS — J45909 Unspecified asthma, uncomplicated: Secondary | ICD-10-CM | POA: Diagnosis not present

## 2012-06-17 DIAGNOSIS — Y9301 Activity, walking, marching and hiking: Secondary | ICD-10-CM | POA: Insufficient documentation

## 2012-06-17 DIAGNOSIS — Z8739 Personal history of other diseases of the musculoskeletal system and connective tissue: Secondary | ICD-10-CM | POA: Diagnosis not present

## 2012-06-17 DIAGNOSIS — Y92009 Unspecified place in unspecified non-institutional (private) residence as the place of occurrence of the external cause: Secondary | ICD-10-CM | POA: Insufficient documentation

## 2012-06-17 DIAGNOSIS — F319 Bipolar disorder, unspecified: Secondary | ICD-10-CM | POA: Insufficient documentation

## 2012-06-17 DIAGNOSIS — Z8742 Personal history of other diseases of the female genital tract: Secondary | ICD-10-CM | POA: Insufficient documentation

## 2012-06-17 DIAGNOSIS — R296 Repeated falls: Secondary | ICD-10-CM | POA: Insufficient documentation

## 2012-06-17 DIAGNOSIS — Z7982 Long term (current) use of aspirin: Secondary | ICD-10-CM | POA: Insufficient documentation

## 2012-06-17 MED ORDER — OXYCODONE-ACETAMINOPHEN 5-325 MG PO TABS: 2.0000 | ORAL_TABLET | ORAL | Status: DC | PRN

## 2012-06-17 MED ORDER — FENTANYL CITRATE 0.05 MG/ML IJ SOLN
50.0000 ug | Freq: Once | INTRAMUSCULAR | Status: AC
  Administered 2012-06-17: 50 ug via INTRAVENOUS
  Filled 2012-06-17: qty 2

## 2012-06-17 NOTE — ED Notes (Signed)
Pt states that she was exercising on her treadmill when she fell hurting her right shoulder.  Pt went to her PCP and was referred to ED for possible moderate sedation for closed reduction to put shoulder back in place.

## 2012-06-17 NOTE — ED Provider Notes (Signed)
History     CSN: [id number]  Arrival date & time 06/17/12  1210   First MD Initiated Contact with Patient 06/17/12 1224      Chief Complaint  Patient presents with  . Shoulder Injury    (Consider location/radiation/quality/duration/timing/severity/associated sxs/prior treatment) HPI Comments: Patient presents after sustaining an injury to her right shoulder. She states that she was exercising on her treadmill and this morning and she fell off injuring her right shoulder. She denies any other injuries. She denies any head injury or loss of consciousness. She denies any neck or back pain. She has a history of prior rotator cuff injury to her right shoulder. She went over to see Dr. Shelle Iron with [facility] orthopedics after the injury and was seen there this morning and was diagnosed with a shoulder dislocation. She was sent over here for shoulder reduction. She denies any lung problems or history of difficulties with anesthesia. She denies any other recent illnesses.  Patient is a 62 y.o. female presenting with shoulder injury.  Shoulder Injury Pertinent negatives include no chest pain, no abdominal pain, no headaches and no shortness of breath.    Past Medical History  Diagnosis Date  . Osteoporosis   . Asthma   . Bipolar disorder   . Migraine   . Ovarian cyst   . Hydatid cyst     Left  . Hypertension   . Depression   . Kidney problem     Past Surgical History  Procedure Date  . Hydatid cyst     Left-Adhesions  . Fimbrioplasty-bilateral-lysis of adhesions-excision of left ovarian cyst   . Rotator cuff repair   . Bunionectomy     Family History  Problem Relation Age of Onset  . Hypertension Mother   . Heart disease Mother   . Diabetes Father   . Hypertension Father   . Heart disease Father   . Stroke Father   . Cancer Father     Lymphoma    History  Substance Use Topics  . Smoking status: Never Smoker   . Smokeless tobacco: Not on file  . Alcohol Use: No     OB History    Grav Para Term Preterm Abortions TAB SAB Ect Mult Living   1    1     0      Review of Systems  Constitutional: Negative for fever, chills, diaphoresis and fatigue.  HENT: Negative for congestion, rhinorrhea and sneezing.   Eyes: Negative.   Respiratory: Negative for cough, chest tightness and shortness of breath.   Cardiovascular: Negative for chest pain and leg swelling.  Gastrointestinal: Negative for nausea, vomiting, abdominal pain, diarrhea and blood in stool.  Genitourinary: Negative for frequency, hematuria, flank pain and difficulty urinating.  Musculoskeletal: Positive for arthralgias (right shoulder). Negative for back pain.  Skin: Negative for rash.  Neurological: Negative for dizziness, speech difficulty, weakness, numbness and headaches.    Allergies  Divalproex sodium; Geodon; and Seroquel  Home Medications   Current Outpatient Rx  Name  Route  Sig  Dispense  Refill  . VENTOLIN HFA IN   Inhalation   Inhale 1 puff into the lungs every 6 (six) hours as needed. wheezing         . ARIPIPRAZOLE 10 MG PO TABS   Oral   Take 10 mg by mouth daily.         . ASPIRIN 81 MG PO TABS   Oral   Take 81 mg by mouth at bedtime.          Marland Kitchen  BUDESONIDE 180 MCG/ACT IN AEPB   Inhalation   Inhale 1 puff into the lungs daily.          Marland Kitchen CALCIUM CARBONATE 600 MG PO TABS   Oral   Take 600 mg by mouth 2 (two) times daily with a meal.         . CETIRIZINE HCL 10 MG PO TABS   Oral   Take 10 mg by mouth daily.         Marland Kitchen VITAMIN D 1000 UNITS PO TABS   Oral   Take 1,000 Units by mouth daily.         Marland Kitchen CLONAZEPAM 1 MG PO TABS   Oral   Take 1 mg by mouth 3 (three) times daily.          . CYANOCOBALAMIN 1000 MCG PO TABS   Oral   Take 100 mcg by mouth daily.         . OMEGA-3 FATTY ACIDS 1000 MG PO CAPS   Oral   Take 1 g by mouth daily.         Marland Kitchen FLUTICASONE PROPIONATE 50 MCG/ACT NA SUSP   Nasal   Place 2 sprays into the nose  daily.         Marland Kitchen LAMOTRIGINE 100 MG PO TABS   Oral   Take 100-200 mg by mouth 2 (two) times daily with breakfast and lunch. Takes 100mg  qAM and 200mg  qPM.         . LANSOPRAZOLE 15 MG PO CPDR   Oral   Take 15 mg by mouth daily.         Marland Kitchen LOSARTAN POTASSIUM-HCTZ 100-12.5 MG PO TABS   Oral   Take 1 tablet by mouth daily.         Lanetta Inch BI-FLEX JOINT SHIELD PO TABS   Oral   Take 1 tablet by mouth 2 (two) times daily.         . ADULT MULTIVITAMIN W/MINERALS CH   Oral   Take 1 tablet by mouth daily.         . ICAPS AREDS FORMULA PO   Oral   Take 1 tablet by mouth 2 (two) times daily.         Marland Kitchen NIACIN ER (ANTIHYPERLIPIDEMIC) 500 MG PO TBCR   Oral   Take 500 mg by mouth daily with supper.         Marland Kitchen OLANZAPINE 5 MG PO TABS   Oral   Take 5 mg by mouth 3 (three) times daily as needed. Takes as needed for headaches.         . OXYCODONE-ACETAMINOPHEN 5-325 MG PO TABS   Oral   Take 1 tablet by mouth every 6 (six) hours as needed. Pain         . PROMETHAZINE HCL 25 MG PO TABS   Oral   Take 25 mg by mouth every 6 (six) hours as needed. Nausea         . TIZANIDINE HCL 4 MG PO CAPS   Oral   Take 4 mg by mouth daily as needed. Takes as needed for headache.         . TOPIRAMATE 200 MG PO TABS   Oral   Take 200 mg by mouth 2 (two) times daily.          . TRAZODONE HCL 100 MG PO TABS   Oral   Take 100 mg by mouth at bedtime.         Marland Kitchen  ZONISAMIDE 100 MG PO CAPS   Oral   Take 300 mg by mouth at bedtime.          . OXYCODONE-ACETAMINOPHEN 5-325 MG PO TABS   Oral   Take 2 tablets by mouth every 4 (four) hours as needed for pain.   15 tablet   0     Pulse 61  Temp 97.3 F (36.3 C)  Resp 20  SpO2 100%  Physical Exam  Constitutional: She is oriented to person, place, and time. She appears well-developed and well-nourished.  HENT:  Head: Normocephalic and atraumatic.  Eyes: Pupils are equal, round, and reactive to light.  Neck: Normal  range of motion. Neck supple.  Cardiovascular: Normal rate, regular rhythm and normal heart sounds.   Pulmonary/Chest: Effort normal and breath sounds normal. No respiratory distress. She has no wheezes. She has no rales. She exhibits no tenderness.  Abdominal: Soft. Bowel sounds are normal. There is no tenderness. There is no rebound and no guarding.  Musculoskeletal: Normal range of motion. She exhibits no edema.       +deformity to right shoulder with pain on palpation.  No pain to neck or back.  Normal sensation/motor function to right hand.  Radial pulses intact  Lymphadenopathy:    She has no cervical adenopathy.  Neurological: She is alert and oriented to person, place, and time.  Skin: Skin is warm and dry. No rash noted.  Psychiatric: She has a normal mood and affect.    ED Course  Reduction of dislocation Date/Time: 06/17/2012 1:06 PM Performed by: Stanlee Roehrig Authorized by: Rolan Bucco Consent: Verbal consent obtained. Risks and benefits: risks, benefits and alternatives were discussed Consent given by: patient Patient sedated: no Patient tolerance: Patient tolerated the procedure well with no immediate complications. Comments: Pt was given fentanyl for pain and shoulder reduced using scapular manipulation technique.    (including critical care time) Results for orders placed in visit on 09/23/11  URINALYSIS WITH CULTURE REFLEX      Component Value Range   Color, Urine YELLOW  YELLOW   APPearance CLOUDY (*) CLEAR   Specific Gravity, Urine 1.016  1.005 - 1.030   pH 7.5  5.0 - 8.0   Glucose, UA NEG  NEG mg/dL   Bilirubin Urine NEG  NEG   Ketones, ur NEG  NEG mg/dL   Hgb urine dipstick NEG  NEG   Protein, ur NEG  NEG mg/dL   Urobilinogen, UA 1  0.0 - 1.0 mg/dL   Nitrite NEG  NEG   Leukocytes, UA NEG  NEG   Squamous Epithelial / LPF NONE SEEN  RARE   Crystals Calcium Oxalate crystals noted  NONE SEEN   Casts NONE SEEN  NONE SEEN   WBC, UA 0-2  <3 WBC/hpf   RBC  / HPF 0-2  <3 RBC/hpf   Bacteria, UA NONE SEEN  RARE   Dg Shoulder Right  06/17/2012  *RADIOLOGY REPORT*  Clinical Data: Right shoulder pain  RIGHT SHOULDER - 2+ VIEW  Comparison: 06/21/2007  Findings: No acute fracture or dislocation is noted. Degenerative changes of the acromioclavicular joint are seen.  The underlying bony thorax is unremarkable.  No soft tissue changes are noted.  IMPRESSION: No acute abnormality is seen.   Original Report Authenticated By: Alcide Clever, M.D.       1. Shoulder dislocation       MDM  Shoulder was reduced in the emergency department. Followup chest x-ray showed complete reduction. She is neurovascularly  intact. She was placed in a shoulder sling and was given pain medication use at home. She has a followup appointment scheduled with Dr. Shelle Iron on January 17.        Rolan Bucco, MD 06/17/12 1351

## 2012-06-29 DIAGNOSIS — M25519 Pain in unspecified shoulder: Secondary | ICD-10-CM | POA: Diagnosis not present

## 2012-07-02 DIAGNOSIS — M25519 Pain in unspecified shoulder: Secondary | ICD-10-CM | POA: Diagnosis not present

## 2012-07-05 DIAGNOSIS — M25519 Pain in unspecified shoulder: Secondary | ICD-10-CM | POA: Diagnosis not present

## 2012-07-06 DIAGNOSIS — F3177 Bipolar disorder, in partial remission, most recent episode mixed: Secondary | ICD-10-CM | POA: Diagnosis not present

## 2012-07-08 DIAGNOSIS — M25519 Pain in unspecified shoulder: Secondary | ICD-10-CM | POA: Diagnosis not present

## 2012-07-12 DIAGNOSIS — M25519 Pain in unspecified shoulder: Secondary | ICD-10-CM | POA: Diagnosis not present

## 2012-07-15 DIAGNOSIS — M25519 Pain in unspecified shoulder: Secondary | ICD-10-CM | POA: Diagnosis not present

## 2012-07-19 DIAGNOSIS — M25519 Pain in unspecified shoulder: Secondary | ICD-10-CM | POA: Diagnosis not present

## 2012-07-27 DIAGNOSIS — M25519 Pain in unspecified shoulder: Secondary | ICD-10-CM | POA: Diagnosis not present

## 2012-08-02 DIAGNOSIS — M25519 Pain in unspecified shoulder: Secondary | ICD-10-CM | POA: Diagnosis not present

## 2012-08-06 DIAGNOSIS — M25519 Pain in unspecified shoulder: Secondary | ICD-10-CM | POA: Diagnosis not present

## 2012-08-17 DIAGNOSIS — J45909 Unspecified asthma, uncomplicated: Secondary | ICD-10-CM | POA: Diagnosis not present

## 2012-08-17 DIAGNOSIS — I129 Hypertensive chronic kidney disease with stage 1 through stage 4 chronic kidney disease, or unspecified chronic kidney disease: Secondary | ICD-10-CM | POA: Diagnosis not present

## 2012-08-17 DIAGNOSIS — M25519 Pain in unspecified shoulder: Secondary | ICD-10-CM | POA: Diagnosis not present

## 2012-08-24 ENCOUNTER — Other Ambulatory Visit: Payer: Self-pay | Admitting: Orthopedic Surgery

## 2012-08-24 ENCOUNTER — Encounter (HOSPITAL_COMMUNITY): Payer: Self-pay | Admitting: Pharmacy Technician

## 2012-08-24 ENCOUNTER — Encounter (HOSPITAL_COMMUNITY): Payer: Self-pay | Admitting: *Deleted

## 2012-08-24 NOTE — Progress Notes (Signed)
Pt stated unable to come for pre op prior to 08-26-12 surgery. Pt instructed to notify dr Shelle Iron unable to come for pre op

## 2012-08-24 NOTE — Progress Notes (Signed)
Need ordedrs put in EPIC please. Pt to be Same Day  08-26-12 (pt unable to come for preop)

## 2012-08-25 ENCOUNTER — Other Ambulatory Visit: Payer: Self-pay | Admitting: Orthopedic Surgery

## 2012-08-25 NOTE — H&P (Signed)
Kelli Morales is an 62 y.o. female.   Chief Complaint: right shoulder pain HPI: R shoulder pain s/p 7 weeks, 4 days out from from dislocation (inferior, which occured when she fell on her treadmill, reduced at [facility] ER). C/o continued pain, stiffness, pain with overhead motions and weakness, while the patient does not report symptoms of: numbness. and report their pain level to be moderate to severe. Has tried physical therapy, home exercise program and pain medications (Percocet).  The patient has reported improvement of their symptoms with: activity modification, physical therapy and rest. Has made a small amt of progress with PT but still very limited in her function due to pain and decreased motion and strength. She needs to use her L arm to assist her R arm with most motions. She does have a hx of RCT to R shoulder in the past which was hx nonoperatively and was functional for her.   Past Medical History  Diagnosis Date  . Osteoporosis   . Asthma   . Bipolar disorder   . Migraine   . Ovarian cyst   . Hydatid cyst     Left  . Hypertension   . Depression   . Osteopenia   . Right rotator cuff tear   . GERD (gastroesophageal reflux disease)   . Kidney problem     RT kidney is smaller -> decreased output    Past Surgical History  Procedure Laterality Date  . Hydatid cyst      Left-Adhesions  . Fimbrioplasty-bilateral-lysis of adhesions-excision of left ovarian cyst  1986  . Rotator cuff repair      LEFT  . Bunionectomy      L foot 07/2011 --RT foot 10/2011    Family History  Problem Relation Age of Onset  . Hypertension Mother   . Heart disease Mother   . Diabetes Father   . Hypertension Father   . Heart disease Father   . Stroke Father   . Cancer Father     Lymphoma   Social History:  reports that she has never smoked. She does not have any smokeless tobacco history on file. She reports that she does not drink alcohol or use illicit drugs.  Allergies:  Allergies   Allergen Reactions  . Divalproex Sodium     Became comatose when she took Depakote, Seroquel, and Geodon together.  Earnestine Leys (Ziprasidone Hydrochloride)     Became comatose when she took Depakote, Seroquel, and Geodon together.  . Seroquel (Quetiapine Fumerate)     Became comatose when she took Depakote, Seroquel, and Geodon together.     (Not in a hospital admission)  No results found for this or any previous visit (from the past 48 hour(s)). No results found.  Review of Systems  Constitutional: Negative.   HENT: Negative.   Eyes: Negative.   Respiratory: Negative.   Cardiovascular: Negative.   Gastrointestinal: Negative.   Genitourinary: Negative.   Musculoskeletal: Positive for joint pain.  Skin: Negative.   Neurological: Negative.   Psychiatric/Behavioral: Positive for depression.    There were no vitals taken for this visit. Physical Exam  Constitutional: She is oriented to person, place, and time. She appears well-developed and well-nourished.  HENT:  Head: Normocephalic and atraumatic.  Eyes: Pupils are equal, round, and reactive to light.  Neck: Normal range of motion. Neck supple.  Cardiovascular: Normal rate and regular rhythm.   Respiratory: Effort normal and breath sounds normal.  GI: Soft. Bowel sounds are normal.  Musculoskeletal:  Right Shoulder:normal clinical contours present. No swelling. Sensation intact to light touch. Skin with no ecchymosis and no erythema. ROM severely limited throughout. Weakness on abduction and on rotation. Impingement sign positive. Pulses WNL.  Neurological: She is alert and oriented to person, place, and time.  Skin: Skin is warm and dry.    MRI R shoulder with full thickness retracted tear of the supraspinatus and infraspinatus, with retraction of bursal surface fibers by 3.2 cm and articular surface fibers by 2.5cm. There is a Hill-Sachs deformity of the humeral head posterior/superior humeral head consistent with inferior  dislocation. Inferior and posterior labral tearing. Inferior capsular edema consistent with dislocation. Partial thickness tear long head bicep tendon. Downsloping acromion with undersurface spurring. Synovitis, bursitis, bursal loose bodies.   Assessment/Plan R shoulder traumatic RCT, s/p dislocation, refractory to conservative tx  Plan: R shoulder mini-open RCR, SAD, possible patch graft Dr. Shelle Iron previously discussed risks, complications, alternatives including but not limited to DVT, PE, infx, bleeding, failure of procedure, need for secondary procedure, nerve injury, anesthesia risk, even death. Discussed the possibility that due to severity and retraction of the RCT it may not be fully repairable. She understands and desires to proceed.  Daniel Johndrow M. 08/25/2012, 1:51 PM

## 2012-08-26 ENCOUNTER — Encounter (HOSPITAL_COMMUNITY): Payer: Self-pay | Admitting: Certified Registered Nurse Anesthetist

## 2012-08-26 ENCOUNTER — Encounter (HOSPITAL_COMMUNITY)
Admission: RE | Disposition: A | Discharge status: Home / Self Care | Payer: Self-pay | Source: Ambulatory Visit | Attending: Specialist

## 2012-08-26 ENCOUNTER — Observation Stay (HOSPITAL_COMMUNITY)
Admission: RE | Admit: 2012-08-26 | Discharge: 2012-08-27 | Disposition: A | Discharge status: Home / Self Care | Payer: Medicare Other | Source: Ambulatory Visit | Attending: Specialist | Admitting: Specialist

## 2012-08-26 ENCOUNTER — Ambulatory Visit (HOSPITAL_COMMUNITY): Payer: Medicare Other

## 2012-08-26 ENCOUNTER — Ambulatory Visit (HOSPITAL_COMMUNITY): Payer: Medicare Other | Admitting: Certified Registered Nurse Anesthetist

## 2012-08-26 DIAGNOSIS — I10 Essential (primary) hypertension: Secondary | ICD-10-CM | POA: Diagnosis not present

## 2012-08-26 DIAGNOSIS — K219 Gastro-esophageal reflux disease without esophagitis: Secondary | ICD-10-CM | POA: Diagnosis not present

## 2012-08-26 DIAGNOSIS — S46819A Strain of other muscles, fascia and tendons at shoulder and upper arm level, unspecified arm, initial encounter: Secondary | ICD-10-CM | POA: Diagnosis not present

## 2012-08-26 DIAGNOSIS — M75110 Incomplete rotator cuff tear or rupture of unspecified shoulder, not specified as traumatic: Secondary | ICD-10-CM | POA: Diagnosis not present

## 2012-08-26 DIAGNOSIS — S43499A Other sprain of unspecified shoulder joint, initial encounter: Secondary | ICD-10-CM | POA: Diagnosis not present

## 2012-08-26 DIAGNOSIS — N289 Disorder of kidney and ureter, unspecified: Secondary | ICD-10-CM | POA: Insufficient documentation

## 2012-08-26 DIAGNOSIS — M810 Age-related osteoporosis without current pathological fracture: Secondary | ICD-10-CM | POA: Insufficient documentation

## 2012-08-26 DIAGNOSIS — F3130 Bipolar disorder, current episode depressed, mild or moderate severity, unspecified: Secondary | ICD-10-CM | POA: Insufficient documentation

## 2012-08-26 DIAGNOSIS — Y93A1 Activity, exercise machines primarily for cardiorespiratory conditioning: Secondary | ICD-10-CM | POA: Insufficient documentation

## 2012-08-26 DIAGNOSIS — M75101 Unspecified rotator cuff tear or rupture of right shoulder, not specified as traumatic: Secondary | ICD-10-CM

## 2012-08-26 DIAGNOSIS — S43429A Sprain of unspecified rotator cuff capsule, initial encounter: Principal | ICD-10-CM | POA: Insufficient documentation

## 2012-08-26 DIAGNOSIS — F319 Bipolar disorder, unspecified: Secondary | ICD-10-CM | POA: Diagnosis not present

## 2012-08-26 DIAGNOSIS — Z79899 Other long term (current) drug therapy: Secondary | ICD-10-CM | POA: Diagnosis not present

## 2012-08-26 DIAGNOSIS — R296 Repeated falls: Secondary | ICD-10-CM | POA: Insufficient documentation

## 2012-08-26 DIAGNOSIS — J45909 Unspecified asthma, uncomplicated: Secondary | ICD-10-CM | POA: Diagnosis not present

## 2012-08-26 HISTORY — DX: Gastro-esophageal reflux disease without esophagitis: K21.9

## 2012-08-26 HISTORY — DX: Unspecified rotator cuff tear or rupture of right shoulder, not specified as traumatic: M75.101

## 2012-08-26 HISTORY — PX: SHOULDER OPEN ROTATOR CUFF REPAIR: SHX2407

## 2012-08-26 LAB — CBC
HCT: 36.5 % (ref 36.0–46.0)
MCHC: 31.8 g/dL (ref 30.0–36.0)
MCV: 92.4 fL (ref 78.0–100.0)
Platelets: 123 10*3/uL — ABNORMAL LOW (ref 150–400)
RDW: 12.1 % (ref 11.5–15.5)

## 2012-08-26 SURGERY — REPAIR, ROTATOR CUFF, OPEN
Anesthesia: General | Site: Shoulder | Laterality: Right | Wound class: Clean

## 2012-08-26 MED ORDER — GLYCOPYRROLATE 0.2 MG/ML IJ SOLN
INTRAMUSCULAR | Status: DC | PRN
  Administered 2012-08-26: 0.6 mg via INTRAVENOUS

## 2012-08-26 MED ORDER — LORATADINE 10 MG PO TABS
10.0000 mg | ORAL_TABLET | Freq: Every day | ORAL | Status: DC
  Administered 2012-08-26 – 2012-08-27 (×2): 10 mg via ORAL
  Filled 2012-08-26 (×2): qty 1

## 2012-08-26 MED ORDER — LAMOTRIGINE 100 MG PO TABS
100.0000 mg | ORAL_TABLET | Freq: Every day | ORAL | Status: DC
  Administered 2012-08-27: 100 mg via ORAL
  Filled 2012-08-26: qty 1

## 2012-08-26 MED ORDER — SUCCINYLCHOLINE CHLORIDE 20 MG/ML IJ SOLN
INTRAMUSCULAR | Status: DC | PRN
  Administered 2012-08-26: 100 mg via INTRAVENOUS

## 2012-08-26 MED ORDER — ASPIRIN 81 MG PO CHEW
81.0000 mg | CHEWABLE_TABLET | Freq: Every day | ORAL | Status: DC
  Administered 2012-08-27: 81 mg via ORAL
  Filled 2012-08-26: qty 1

## 2012-08-26 MED ORDER — LACTATED RINGERS IV SOLN
INTRAVENOUS | Status: DC
  Administered 2012-08-26: 12:00:00 via INTRAVENOUS

## 2012-08-26 MED ORDER — ACETAMINOPHEN 10 MG/ML IV SOLN
INTRAVENOUS | Status: AC
  Filled 2012-08-26: qty 100

## 2012-08-26 MED ORDER — MIDAZOLAM HCL 5 MG/5ML IJ SOLN
INTRAMUSCULAR | Status: DC | PRN
  Administered 2012-08-26: 2 mg via INTRAVENOUS

## 2012-08-26 MED ORDER — CHLORHEXIDINE GLUCONATE 4 % EX LIQD: 60.0000 mL | Freq: Once | CUTANEOUS | Status: DC

## 2012-08-26 MED ORDER — SODIUM CHLORIDE 0.45 % IV SOLN
INTRAVENOUS | Status: DC
  Administered 2012-08-26: 16:00:00 via INTRAVENOUS

## 2012-08-26 MED ORDER — SODIUM CHLORIDE 0.9 % IR SOLN
Status: DC | PRN
  Administered 2012-08-26: 13:00:00

## 2012-08-26 MED ORDER — ENOXAPARIN SODIUM 30 MG/0.3ML ~~LOC~~ SOLN
30.0000 mg | SUBCUTANEOUS | Status: DC
  Administered 2012-08-27: 30 mg via SUBCUTANEOUS
  Filled 2012-08-26 (×2): qty 0.3

## 2012-08-26 MED ORDER — MENTHOL 3 MG MT LOZG: 1.0000 | LOZENGE | OROMUCOSAL | Status: DC | PRN

## 2012-08-26 MED ORDER — PROMETHAZINE HCL 25 MG/ML IJ SOLN: 6.2500 mg | INTRAMUSCULAR | Status: DC | PRN

## 2012-08-26 MED ORDER — FLUTICASONE PROPIONATE 50 MCG/ACT NA SUSP
2.0000 | Freq: Every day | NASAL | Status: DC
  Administered 2012-08-26 – 2012-08-27 (×2): 2 via NASAL
  Filled 2012-08-26: qty 16

## 2012-08-26 MED ORDER — ONDANSETRON HCL 4 MG/2ML IJ SOLN
INTRAMUSCULAR | Status: DC | PRN
  Administered 2012-08-26: 4 mg via INTRAVENOUS

## 2012-08-26 MED ORDER — VITAMIN B-12 100 MCG PO TABS
100.0000 ug | ORAL_TABLET | Freq: Every day | ORAL | Status: DC
  Administered 2012-08-26 – 2012-08-27 (×2): 100 ug via ORAL
  Filled 2012-08-26 (×2): qty 1

## 2012-08-26 MED ORDER — CEFAZOLIN SODIUM-DEXTROSE 2-3 GM-% IV SOLR
INTRAVENOUS | Status: AC
  Filled 2012-08-26: qty 50

## 2012-08-26 MED ORDER — BUPIVACAINE-EPINEPHRINE (PF) 0.5% -1:200000 IJ SOLN
INTRAMUSCULAR | Status: AC
  Filled 2012-08-26: qty 10

## 2012-08-26 MED ORDER — LAMOTRIGINE 200 MG PO TABS
200.0000 mg | ORAL_TABLET | Freq: Every day | ORAL | Status: DC
  Administered 2012-08-26: 200 mg via ORAL
  Filled 2012-08-26 (×2): qty 1

## 2012-08-26 MED ORDER — ZONISAMIDE 100 MG PO CAPS
300.0000 mg | ORAL_CAPSULE | Freq: Every day | ORAL | Status: DC
Start: 2012-08-26 — End: 2012-08-27
  Administered 2012-08-26: 300 mg via ORAL
  Filled 2012-08-26 (×2): qty 3

## 2012-08-26 MED ORDER — HYDROMORPHONE HCL PF 1 MG/ML IJ SOLN: 0.5000 mg | INTRAMUSCULAR | Status: DC | PRN

## 2012-08-26 MED ORDER — EPHEDRINE SULFATE 50 MG/ML IJ SOLN
INTRAMUSCULAR | Status: DC | PRN
  Administered 2012-08-26: 10 mg via INTRAVENOUS
  Administered 2012-08-26: 5 mg via INTRAVENOUS

## 2012-08-26 MED ORDER — PHENOL 1.4 % MT LIQD: 1.0000 | OROMUCOSAL | Status: DC | PRN

## 2012-08-26 MED ORDER — ALBUTEROL SULFATE HFA 108 (90 BASE) MCG/ACT IN AERS
1.0000 | INHALATION_SPRAY | Freq: Four times a day (QID) | RESPIRATORY_TRACT | Status: DC
  Administered 2012-08-27: 1 via RESPIRATORY_TRACT
  Filled 2012-08-26: qty 6.7

## 2012-08-26 MED ORDER — NIACIN ER (ANTIHYPERLIPIDEMIC) 500 MG PO TBCR
500.0000 mg | EXTENDED_RELEASE_TABLET | Freq: Every day | ORAL | Status: DC
  Administered 2012-08-26: 500 mg via ORAL
  Filled 2012-08-26 (×2): qty 1

## 2012-08-26 MED ORDER — ACETAMINOPHEN 10 MG/ML IV SOLN
INTRAVENOUS | Status: DC | PRN
  Administered 2012-08-26: 1000 mg via INTRAVENOUS

## 2012-08-26 MED ORDER — LIDOCAINE HCL (CARDIAC) 20 MG/ML IV SOLN
INTRAVENOUS | Status: DC | PRN
  Administered 2012-08-26: 100 mg via INTRAVENOUS

## 2012-08-26 MED ORDER — CEFAZOLIN SODIUM-DEXTROSE 2-3 GM-% IV SOLR
2.0000 g | INTRAVENOUS | Status: AC
  Administered 2012-08-26: 2 g via INTRAVENOUS

## 2012-08-26 MED ORDER — MUPIROCIN 2 % EX OINT
TOPICAL_OINTMENT | Freq: Two times a day (BID) | CUTANEOUS | Status: DC
  Filled 2012-08-26: qty 22

## 2012-08-26 MED ORDER — ADULT MULTIVITAMIN W/MINERALS CH
1.0000 | ORAL_TABLET | Freq: Every day | ORAL | Status: DC
  Administered 2012-08-26 – 2012-08-27 (×2): 1 via ORAL
  Filled 2012-08-26 (×2): qty 1

## 2012-08-26 MED ORDER — LOSARTAN POTASSIUM 50 MG PO TABS
100.0000 mg | ORAL_TABLET | Freq: Every day | ORAL | Status: DC
  Administered 2012-08-27: 100 mg via ORAL
  Filled 2012-08-26: qty 2

## 2012-08-26 MED ORDER — CLONAZEPAM 1 MG PO TABS
1.0000 mg | ORAL_TABLET | Freq: Three times a day (TID) | ORAL | Status: DC
  Administered 2012-08-26 – 2012-08-27 (×3): 1 mg via ORAL
  Filled 2012-08-26 (×3): qty 1

## 2012-08-26 MED ORDER — ROCURONIUM BROMIDE 100 MG/10ML IV SOLN
INTRAVENOUS | Status: DC | PRN
  Administered 2012-08-26: 10 mg via INTRAVENOUS
  Administered 2012-08-26: 30 mg via INTRAVENOUS

## 2012-08-26 MED ORDER — OXYCODONE-ACETAMINOPHEN 5-325 MG PO TABS: 1.0000 | ORAL_TABLET | ORAL | Status: DC | PRN

## 2012-08-26 MED ORDER — PROMETHAZINE HCL 25 MG PO TABS: 25.0000 mg | ORAL_TABLET | Freq: Four times a day (QID) | ORAL | Status: DC | PRN

## 2012-08-26 MED ORDER — ARIPIPRAZOLE 10 MG PO TABS
10.0000 mg | ORAL_TABLET | Freq: Every morning | ORAL | Status: DC
  Administered 2012-08-27: 10 mg via ORAL
  Filled 2012-08-26: qty 1

## 2012-08-26 MED ORDER — CEFAZOLIN SODIUM 1-5 GM-% IV SOLN
1.0000 g | Freq: Four times a day (QID) | INTRAVENOUS | Status: AC
  Administered 2012-08-26 – 2012-08-27 (×3): 1 g via INTRAVENOUS
  Filled 2012-08-26 (×3): qty 50

## 2012-08-26 MED ORDER — PROPOFOL 10 MG/ML IV BOLUS
INTRAVENOUS | Status: DC | PRN
  Administered 2012-08-26: 150 mg via INTRAVENOUS

## 2012-08-26 MED ORDER — DEXAMETHASONE SODIUM PHOSPHATE 10 MG/ML IJ SOLN
INTRAMUSCULAR | Status: DC | PRN
  Administered 2012-08-26: 10 mg via INTRAVENOUS

## 2012-08-26 MED ORDER — HYDROMORPHONE HCL PF 1 MG/ML IJ SOLN: 0.2500 mg | INTRAMUSCULAR | Status: DC | PRN

## 2012-08-26 MED ORDER — HYDROCODONE-ACETAMINOPHEN 5-325 MG PO TABS: 1.0000 | ORAL_TABLET | ORAL | Status: DC | PRN

## 2012-08-26 MED ORDER — BUPIVACAINE-EPINEPHRINE 0.5% -1:200000 IJ SOLN
INTRAMUSCULAR | Status: DC | PRN
  Administered 2012-08-26: 9 mL

## 2012-08-26 MED ORDER — FENTANYL CITRATE 0.05 MG/ML IJ SOLN
INTRAMUSCULAR | Status: DC | PRN
  Administered 2012-08-26: 25 ug via INTRAVENOUS
  Administered 2012-08-26 (×2): 50 ug via INTRAVENOUS
  Administered 2012-08-26 (×3): 25 ug via INTRAVENOUS

## 2012-08-26 MED ORDER — TIZANIDINE HCL 4 MG PO TABS
4.0000 mg | ORAL_TABLET | Freq: Every day | ORAL | Status: DC | PRN
  Filled 2012-08-26: qty 1

## 2012-08-26 MED ORDER — TRAZODONE HCL 100 MG PO TABS
100.0000 mg | ORAL_TABLET | Freq: Every day | ORAL | Status: DC
  Administered 2012-08-26: 100 mg via ORAL
  Filled 2012-08-26 (×2): qty 1

## 2012-08-26 MED ORDER — HYDROCHLOROTHIAZIDE 12.5 MG PO CAPS
12.5000 mg | ORAL_CAPSULE | Freq: Every day | ORAL | Status: DC
  Administered 2012-08-27: 12.5 mg via ORAL
  Filled 2012-08-26: qty 1

## 2012-08-26 MED ORDER — DOCUSATE SODIUM 100 MG PO CAPS
100.0000 mg | ORAL_CAPSULE | Freq: Two times a day (BID) | ORAL | Status: DC
  Administered 2012-08-26 – 2012-08-27 (×2): 100 mg via ORAL
  Filled 2012-08-26 (×3): qty 1

## 2012-08-26 MED ORDER — TOPIRAMATE 100 MG PO TABS
200.0000 mg | ORAL_TABLET | Freq: Two times a day (BID) | ORAL | Status: DC
  Administered 2012-08-26 – 2012-08-27 (×2): 200 mg via ORAL
  Filled 2012-08-26 (×4): qty 2

## 2012-08-26 MED ORDER — MUPIROCIN 2 % EX OINT
TOPICAL_OINTMENT | Freq: Two times a day (BID) | CUTANEOUS | Status: DC
  Administered 2012-08-26 – 2012-08-27 (×2): via NASAL

## 2012-08-26 MED ORDER — LACTATED RINGERS IV SOLN: INTRAVENOUS | Status: DC

## 2012-08-26 MED ORDER — PANTOPRAZOLE SODIUM 20 MG PO TBEC
20.0000 mg | DELAYED_RELEASE_TABLET | Freq: Every day | ORAL | Status: DC
  Administered 2012-08-27: 20 mg via ORAL
  Filled 2012-08-26: qty 1

## 2012-08-26 MED ORDER — FLUTICASONE PROPIONATE HFA 44 MCG/ACT IN AERO
2.0000 | INHALATION_SPRAY | Freq: Two times a day (BID) | RESPIRATORY_TRACT | Status: DC
  Administered 2012-08-26 – 2012-08-27 (×2): 2 via RESPIRATORY_TRACT
  Filled 2012-08-26: qty 10.6

## 2012-08-26 MED ORDER — LOSARTAN POTASSIUM-HCTZ 100-12.5 MG PO TABS: 1.0000 | ORAL_TABLET | Freq: Every morning | ORAL | Status: DC

## 2012-08-26 MED ORDER — NEOSTIGMINE METHYLSULFATE 1 MG/ML IJ SOLN
INTRAMUSCULAR | Status: DC | PRN
  Administered 2012-08-26: 5 mg via INTRAVENOUS

## 2012-08-26 MED ORDER — OXYCODONE-ACETAMINOPHEN 5-325 MG PO TABS
1.0000 | ORAL_TABLET | ORAL | Status: DC | PRN
  Administered 2012-08-26 – 2012-08-27 (×4): 1 via ORAL
  Filled 2012-08-26 (×4): qty 1

## 2012-08-26 SURGICAL SUPPLY — 55 items
ANCH SUT 2 CP-2 ROTR CUF (Anchor) ×2 IMPLANT
ANCH SUT PUSHLCK 24X4.5 STRL (Orthopedic Implant) ×1 IMPLANT
ANCHOR ROTATOR CUFF #2 (Anchor) ×4 IMPLANT
APL SKNCLS STERI-STRIP NONHPOA (GAUZE/BANDAGES/DRESSINGS) ×1
BAG SPEC THK2 15X12 ZIP CLS (MISCELLANEOUS) ×1
BAG ZIPLOCK 12X15 (MISCELLANEOUS) ×2 IMPLANT
BENZOIN TINCTURE PRP APPL 2/3 (GAUZE/BANDAGES/DRESSINGS) ×2 IMPLANT
BLADE OSCILLATING/SAGITTAL (BLADE) ×4
BLADE SW THK.38XMED LNG THN (BLADE) ×1 IMPLANT
BNDG ADH 5X4 AIR PERM ELC (GAUZE/BANDAGES/DRESSINGS) ×1
BNDG COHESIVE 4X5 WHT NS (GAUZE/BANDAGES/DRESSINGS) ×2 IMPLANT
BUR OVAL CARBIDE 4.0 (BURR) ×1 IMPLANT
CHLORAPREP W/TINT 26ML (MISCELLANEOUS) IMPLANT
CLEANER TIP ELECTROSURG 2X2 (MISCELLANEOUS) ×2 IMPLANT
CLOTH BEACON ORANGE TIMEOUT ST (SAFETY) ×2 IMPLANT
DECANTER SPIKE VIAL GLASS SM (MISCELLANEOUS) ×1 IMPLANT
DRAPE ORTHO SPLIT 77X108 STRL (DRAPES) ×2
DRAPE POUCH INSTRU U-SHP 10X18 (DRAPES) ×2 IMPLANT
DRAPE SURG ORHT 6 SPLT 77X108 (DRAPES) ×1 IMPLANT
DRSG AQUACEL AG ADV 3.5X 4 (GAUZE/BANDAGES/DRESSINGS) ×1 IMPLANT
DRSG EMULSION OIL 3X3 NADH (GAUZE/BANDAGES/DRESSINGS) ×2 IMPLANT
DURAPREP 26ML APPLICATOR (WOUND CARE) ×1 IMPLANT
ELECT NDL TIP 2.8 STRL (NEEDLE) ×1 IMPLANT
ELECT NEEDLE TIP 2.8 STRL (NEEDLE) ×2 IMPLANT
ELECT REM PT RETURN 9FT ADLT (ELECTROSURGICAL) ×2
ELECTRODE REM PT RTRN 9FT ADLT (ELECTROSURGICAL) ×1 IMPLANT
GLOVE BIOGEL PI IND STRL 7.5 (GLOVE) ×1 IMPLANT
GLOVE BIOGEL PI IND STRL 8 (GLOVE) ×1 IMPLANT
GLOVE BIOGEL PI INDICATOR 7.5 (GLOVE) ×1
GLOVE BIOGEL PI INDICATOR 8 (GLOVE) ×1
GLOVE SURG SS PI 7.5 STRL IVOR (GLOVE) ×2 IMPLANT
GLOVE SURG SS PI 8.0 STRL IVOR (GLOVE) ×4 IMPLANT
GOWN PREVENTION PLUS LG XLONG (DISPOSABLE) ×2 IMPLANT
GOWN STRL REIN XL XLG (GOWN DISPOSABLE) ×4 IMPLANT
KIT BASIN OR (CUSTOM PROCEDURE TRAY) ×2 IMPLANT
MANIFOLD NEPTUNE II (INSTRUMENTS) ×2 IMPLANT
NDL MA TROC 1/2 (NEEDLE) ×1 IMPLANT
NDL MA TROC 1/2 CIR (NEEDLE) IMPLANT
NEEDLE MA TROC 1/2 (NEEDLE) ×2 IMPLANT
NEEDLE MA TROC 1/2 CIR (NEEDLE) IMPLANT
PACK SHOULDER CUSTOM OPM052 (CUSTOM PROCEDURE TRAY) ×2 IMPLANT
POSITIONER SURGICAL ARM (MISCELLANEOUS) ×1 IMPLANT
PUSHLOCK PEEK 4.5X24 (Orthopedic Implant) ×3 IMPLANT
SLING ARM IMMOBILIZER LRG (SOFTGOODS) ×2 IMPLANT
SLING ULTRA II S (ORTHOPEDIC SUPPLIES) ×2 IMPLANT
SPONGE LAP 4X18 X RAY DECT (DISPOSABLE) ×2 IMPLANT
STRIP CLOSURE SKIN 1/2X4 (GAUZE/BANDAGES/DRESSINGS) ×2 IMPLANT
SUT BONE WAX W31G (SUTURE) ×2 IMPLANT
SUT ETHIBOND 0 (SUTURE) ×2 IMPLANT
SUT ETHIBOND 2 OS 4 DA (SUTURE) ×2 IMPLANT
SUT PROLENE 3 0 PS 2 (SUTURE) ×2 IMPLANT
SUT VIC AB 1-0 CT2 27 (SUTURE) ×3 IMPLANT
SUT VIC AB 2-0 CT2 27 (SUTURE) ×2 IMPLANT
SUT VICRYL 0 UR6 27IN ABS (SUTURE) ×3 IMPLANT
SUT VICRYL 0-0 OS 2 NEEDLE (SUTURE) ×1 IMPLANT

## 2012-08-26 NOTE — Anesthesia Preprocedure Evaluation (Addendum)
Anesthesia Evaluation  Patient identified by MRN, date of birth, ID band Patient awake    Reviewed: Allergy & Precautions, H&P , NPO status , Patient's Chart, lab work & pertinent test results  Airway Mallampati: II TM Distance: >3 FB Neck ROM: Full    Dental  (+) Dental Advisory Given and Teeth Intact   Pulmonary asthma ,  breath sounds clear to auscultation        Cardiovascular hypertension, Pt. on medications Rhythm:Regular Rate:Normal     Neuro/Psych  Headaches, PSYCHIATRIC DISORDERS Depression Bipolar Disorder  Neuromuscular disease    GI/Hepatic Neg liver ROS, GERD-  Medicated,  Endo/Other  negative endocrine ROS  Renal/GU Renal disease     Musculoskeletal negative musculoskeletal ROS (+)   Abdominal   Peds  Hematology negative hematology ROS (+)   Anesthesia Other Findings   Reproductive/Obstetrics                          Anesthesia Physical Anesthesia Plan  ASA: II  Anesthesia Plan: General   Post-op Pain Management:    Induction: Intravenous  Airway Management Planned: Oral ETT  Additional Equipment:   Intra-op Plan:   Post-operative Plan: Extubation in OR  Informed Consent: I have reviewed the patients History and Physical, chart, labs and discussed the procedure including the risks, benefits and alternatives for the proposed anesthesia with the patient or authorized representative who has indicated his/her understanding and acceptance.   Dental advisory given  Plan Discussed with: CRNA  Anesthesia Plan Comments:         Anesthesia Quick Evaluation

## 2012-08-26 NOTE — Transfer of Care (Signed)
Immediate Anesthesia Transfer of Care Note  Patient: Kelli Morales  Procedure(s) Performed: Procedure(s) (LRB): RIGHT SHOULDER MINI OPEN ROTATOR CUFF REPAIR POSSIBLE PATCH GRAFT AND SUBACROMIAL DECOMPRESSION (Right)  Patient Location: PACU  Anesthesia Type: General  Level of Consciousness: sedated, patient cooperative and responds to stimulaton  Airway & Oxygen Therapy: Patient Spontanous Breathing and Patient connected to face mask oxgen  Post-op Assessment: Report given to PACU RN and Post -op Vital signs reviewed and stable  Post vital signs: Reviewed and stable  Complications: No apparent anesthesia complications

## 2012-08-26 NOTE — Brief Op Note (Signed)
08/26/2012  2:17 PM  PATIENT:  Kelli Morales  62 y.o. female  PRE-OPERATIVE DIAGNOSIS:  RIGHT SHOULDER ROTATOR CUFF TEAR  POST-OPERATIVE DIAGNOSIS:  RIGHT SHOULDER ROTATOR CUFF TEAR  PROCEDURE:  Procedure(s): RIGHT SHOULDER MINI OPEN ROTATOR CUFF REPAIR POSSIBLE PATCH GRAFT AND SUBACROMIAL DECOMPRESSION (Right)  SURGEON:  Surgeon(s) and Role:    * Javier Docker, MD - Primary  PHYSICIAN ASSISTANT:   ASSISTANTS: Bissell   ANESTHESIA:   general  EBL:     BLOOD ADMINISTERED:none  DRAINS: none   LOCAL MEDICATIONS USED:  MARCAINE     SPECIMEN:  No Specimen  DISPOSITION OF SPECIMEN:  N/A  COUNTS:  YES  TOURNIQUET:  * No tourniquets in log *  DICTATION: .Other Dictation: Dictation Number [id number]  PLAN OF CARE: Admit for overnight observation  PATIENT DISPOSITION:  PACU - hemodynamically stable.   Delay start of Pharmacological VTE agent (>24hrs) due to surgical blood loss or risk of bleeding: no

## 2012-08-26 NOTE — Interval H&P Note (Signed)
History and Physical Interval Note:  08/26/2012 7:10 AM  Kelli Morales  has presented today for surgery, with the diagnosis of RIGHT SHOULDER ROTATOR CUFF TEAR  The various methods of treatment have been discussed with the patient and family. After consideration of risks, benefits and other options for treatment, the patient has consented to  Procedure(s): RIGHT SHOULDER MINI OPEN ROTATOR CUFF REPAIR POSSIBLE PATCH GRAFT AND SUBACROMIAL DECOMPRESSION (Right) as a surgical intervention .  The patient's history has been reviewed, patient examined, no change in status, stable for surgery.  I have reviewed the patient's chart and labs.  Questions were answered to the patient's satisfaction.     Dorian Duval C

## 2012-08-26 NOTE — Preoperative (Signed)
Beta Blockers   Reason not to administer Beta Blockers:Not Applicable 

## 2012-08-26 NOTE — H&P (View-Only) (Signed)
Kelli Morales is an 61 y.o. female.   Chief Complaint: right shoulder pain HPI: R shoulder pain s/p 7 weeks, 4 days out from from dislocation (inferior, which occured when Kelli Morales fell on Kelli Morales treadmill, reduced at [facility] ER). C/o continued pain, stiffness, pain with overhead motions and weakness, while the patient does not report symptoms of: numbness. and report their pain level to be moderate to severe. Has tried physical therapy, home exercise program and pain medications (Percocet).  The patient has reported improvement of their symptoms with: activity modification, physical therapy and rest. Has made a small amt of progress with PT but still very limited in Kelli Morales function due to pain and decreased motion and strength. Kelli Morales needs to use Kelli Morales L arm to assist Kelli Morales R arm with most motions. Kelli Morales does have a hx of RCT to R shoulder in the past which was hx nonoperatively and was functional for Kelli Morales.   Past Medical History  Diagnosis Date  . Osteoporosis   . Asthma   . Bipolar disorder   . Migraine   . Ovarian cyst   . Hydatid cyst     Left  . Hypertension   . Depression   . Osteopenia   . Right rotator cuff tear   . GERD (gastroesophageal reflux disease)   . Kidney problem     RT kidney is smaller -> decreased output    Past Surgical History  Procedure Laterality Date  . Hydatid cyst      Left-Adhesions  . Fimbrioplasty-bilateral-lysis of adhesions-excision of left ovarian cyst  1986  . Rotator cuff repair      LEFT  . Bunionectomy      L foot 07/2011 --RT foot 10/2011    Family History  Problem Relation Age of Onset  . Hypertension Mother   . Heart disease Mother   . Diabetes Father   . Hypertension Father   . Heart disease Father   . Stroke Father   . Cancer Father     Lymphoma   Social History:  reports that Kelli Morales has never smoked. Kelli Morales does not have any smokeless tobacco history on file. Kelli Morales reports that Kelli Morales does not drink alcohol or use illicit drugs.  Allergies:  Allergies   Allergen Reactions  . Divalproex Sodium     Became comatose when Kelli Morales took Depakote, Seroquel, and Geodon together.  . Geodon (Ziprasidone Hydrochloride)     Became comatose when Kelli Morales took Depakote, Seroquel, and Geodon together.  . Seroquel (Quetiapine Fumerate)     Became comatose when Kelli Morales took Depakote, Seroquel, and Geodon together.     (Not in a hospital admission)  No results found for this or any previous visit (from the past 48 hour(s)). No results found.  Review of Systems  Constitutional: Negative.   HENT: Negative.   Eyes: Negative.   Respiratory: Negative.   Cardiovascular: Negative.   Gastrointestinal: Negative.   Genitourinary: Negative.   Musculoskeletal: Positive for joint pain.  Skin: Negative.   Neurological: Negative.   Psychiatric/Behavioral: Positive for depression.    There were no vitals taken for this visit. Physical Exam  Constitutional: Kelli Morales is oriented to person, place, and time. Kelli Morales appears well-developed and well-nourished.  HENT:  Head: Normocephalic and atraumatic.  Eyes: Pupils are equal, round, and reactive to light.  Neck: Normal range of motion. Neck supple.  Cardiovascular: Normal rate and regular rhythm.   Respiratory: Effort normal and breath sounds normal.  GI: Soft. Bowel sounds are normal.  Musculoskeletal:    Right Shoulder:normal clinical contours present. No swelling. Sensation intact to light touch. Skin with no ecchymosis and no erythema. ROM severely limited throughout. Weakness on abduction and on rotation. Impingement sign positive. Pulses WNL.  Neurological: Kelli Morales is alert and oriented to person, place, and time.  Skin: Skin is warm and dry.    MRI R shoulder with full thickness retracted tear of the supraspinatus and infraspinatus, with retraction of bursal surface fibers by 3.2 cm and articular surface fibers by 2.5cm. There is a Hill-Sachs deformity of the humeral head posterior/superior humeral head consistent with inferior  dislocation. Inferior and posterior labral tearing. Inferior capsular edema consistent with dislocation. Partial thickness tear long head bicep tendon. Downsloping acromion with undersurface spurring. Synovitis, bursitis, bursal loose bodies.   Assessment/Plan R shoulder traumatic RCT, s/p dislocation, refractory to conservative tx  Plan: R shoulder mini-open RCR, SAD, possible patch graft Dr. Beane previously discussed risks, complications, alternatives including but not limited to DVT, PE, infx, bleeding, failure of procedure, need for secondary procedure, nerve injury, anesthesia risk, even death. Discussed the possibility that due to severity and retraction of the RCT it may not be fully repairable. Kelli Morales understands and desires to proceed.  Sahas Sluka M. 08/25/2012, 1:51 PM    

## 2012-08-27 ENCOUNTER — Encounter (HOSPITAL_COMMUNITY): Payer: Self-pay | Admitting: Specialist

## 2012-08-27 NOTE — Evaluation (Signed)
Occupational Therapy Evaluation Patient Details Name: Kelli Morales MRN: [id number] DOB: Mar 25, 1951 Today's Date: 08/27/2012 Time: 2725-3664 OT Time Calculation (min): 44 min  OT Assessment / Plan / Recommendation Clinical Impression  This 62 year old female was admitted for R RCR.  All education was completed.  Pt will follow up with Dr. Shelle Iron for further rehab.      OT Assessment  Progress rehab of shoulder as ordered by MD at follow-up appointment    Follow Up Recommendations   (pt will follow up with dr Shelle Iron)    Barriers to Discharge      Equipment Recommendations  None recommended by OT    Recommendations for Other Services    Frequency       Precautions / Restrictions Precautions Precautions: Shoulder Type of Shoulder Precautions: sling on x bath/dress/pendulums Precaution Booklet Issued: Yes (comment) Restrictions Weight Bearing Restrictions: No   Pertinent Vitals/Pain 4/10 R shoulder premedicated and ice applied    ADL  Grooming: Set up Where Assessed - Grooming: Unsupported standing Upper Body Bathing: Moderate assistance Where Assessed - Upper Body Bathing: Unsupported sitting Lower Body Bathing: Moderate assistance Where Assessed - Lower Body Bathing: Unsupported sitting Upper Body Dressing: Maximal assistance Where Assessed - Upper Body Dressing: Unsupported sitting Lower Body Dressing: Maximal assistance Where Assessed - Lower Body Dressing: Unsupported sit to stand Toilet Transfer: Performed;Modified independent Toilet Transfer Method: Sit to Barista: Comfort height toilet Toileting - Clothing Manipulation and Hygiene: Moderate assistance Where Assessed - Toileting Clothing Manipulation and Hygiene: Sit to stand from 3-in-1 or toilet Transfers/Ambulation Related to ADLs: pt mod I with bed mobility and independent with ambulation ADL Comments: mother present:  worked through ADL and all education for adls as well as pendulums.   Pt/mother verbalize understanding    OT Diagnosis:    OT Problem List:   OT Treatment Interventions:     OT Goals    Visit Information  Last OT Received On: 08/27/12 Assistance Needed: +1    Subjective Data  Subjective: ive been waiting for you Patient Stated Goal: rue moving well   Prior Functioning     Home Living Lives With: Spouse Available Help at Discharge: Family Additional Comments: mother will help initially; present for session.  Pt had L RCR 2 years ago Prior Function Level of Independence: Independent Communication Communication: No difficulties Dominant Hand: Right         Vision/Perception     Cognition  Cognition Overall Cognitive Status: Appears within functional limits for tasks assessed/performed Arousal/Alertness: Awake/alert Orientation Level: Oriented X4 / Intact Behavior During Session: WFL for tasks performed    Extremity/Trunk Assessment Right Upper Extremity Assessment RUE ROM/Strength/Tone: Unable to fully assess;Due to precautions Left Upper Extremity Assessment LUE ROM/Strength/Tone: [facility] for tasks assessed     Mobility Bed Mobility Bed Mobility: Supine to Sit Supine to Sit: 6: Modified independent (Device/Increase time);HOB elevated;With rails Transfers Transfers: Sit to Stand Sit to Stand: 7: Independent     Exercise Donning/doffing shirt without moving shoulder: Caregiver independent with task;Patient able to independently direct caregiver Method for sponge bathing under operated UE: Caregiver independent with task;Patient able to independently direct caregiver Donning/doffing sling/immobilizer: Caregiver independent with task Correct positioning of sling/immobilizer: Caregiver independent with task Pendulum exercises (written home exercise program): Supervision/safety (cues to perform passively; pt verbalizes) ROM for elbow, wrist and digits of operated UE: Independent Sling wearing schedule (on at all times/off for  ADL's): Independent Proper positioning of operated UE when showering: Independent  Positioning of UE while sleeping: Patient able to independently direct caregiver   Balance     End of Session OT - End of Session Activity Tolerance: Patient tolerated treatment well Patient left: in bed;with call bell/phone within reach;with family/visitor present  GO Functional Assessment Tool Used: clinical observation Functional Limitation: Self care Self Care Current Status (Z6109): At least 60 percent but less than 80 percent impaired, limited or restricted Self Care Goal Status (U0454): At least 60 percent but less than 80 percent impaired, limited or restricted Self Care Discharge Status [phone]): At least 60 percent but less than 80 percent impaired, limited or restricted   [facility] 08/27/2012, 9:37 AM Marica Otter, OTR/L [phone] 08/27/2012

## 2012-08-27 NOTE — Progress Notes (Signed)
Subjective: 1 Day Post-Op Procedure(s) (LRB): RIGHT SHOULDER MINI OPEN ROTATOR CUFF REPAIR POSSIBLE PATCH GRAFT AND SUBACROMIAL DECOMPRESSION (Right) Patient reports pain as mild.  Pain well controlled. Overall did well overnight, had one episode of sharp pain. No arm pain, numbness, tingling. Her mother is here with her this morning.  Objective: Vital signs in last 24 hours: Temp:  [96.9 F (36.1 C)-97.9 F (36.6 C)] 97.9 F (36.6 C) (03/21 0557) Pulse Rate:  [58-83] 58 (03/21 0557) Resp:  [7-16] 14 (03/21 0557) BP: (93-145)/(48-80) 110/71 mmHg (03/21 0557) SpO2:  [97 %-100 %] 100 % (03/21 0557) Weight:  [63.277 kg (139 lb 8 oz)] 63.277 kg (139 lb 8 oz) (03/20 1119)  Intake/Output from previous day: 03/20 0701 - 03/21 0700 In: 1729.2 [I.V.:1579.2; IV Piggyback:150] Out: 900 [Urine:900] Intake/Output this shift:     Recent Labs  08/26/12 1636  HGB 11.6*    Recent Labs  08/26/12 1636  WBC 3.2*  RBC 3.95  HCT 36.5  PLT 123*    Recent Labs  08/26/12 1636  CREATININE 1.36*   No results found for this basename: LABPT, INR,  in the last 72 hours  Neurologically intact Neurovascular intact Sensation intact distally Intact pulses distally Dorsiflexion/Plantar flexion intact Incision: dressing C/D/I and no drainage No cellulitis present Compartment soft  Assessment/Plan: 1 Day Post-Op Procedure(s) (LRB): RIGHT SHOULDER MINI OPEN ROTATOR CUFF REPAIR POSSIBLE PATCH GRAFT AND SUBACROMIAL DECOMPRESSION (Right) Advance diet Up with therapy D/C IV fluids D/C home today after PT/OT visit Reviewed D/C instructions Will discuss with Dr. Elissa Lovett, Dayna Barker. 08/27/2012, 7:22 AM

## 2012-08-27 NOTE — Anesthesia Postprocedure Evaluation (Signed)
Anesthesia Post Note  Patient: Kelli Morales  Procedure(s) Performed: Procedure(s) (LRB): RIGHT SHOULDER MINI OPEN ROTATOR CUFF REPAIR POSSIBLE PATCH GRAFT AND SUBACROMIAL DECOMPRESSION (Right)  Anesthesia type: General  Patient location: PACU  Post pain: Pain level controlled  Post assessment: Post-op Vital signs reviewed  Last Vitals: BP 110/71  Pulse 58  Temp(Src) 36.6 C (Oral)  Resp 14  Ht 5\' 5"  (1.651 m)  Wt 139 lb 8 oz (63.277 kg)  BMI 23.21 kg/m2  SpO2 100%  Post vital signs: Reviewed  Level of consciousness: sedated  Complications: No apparent anesthesia complications

## 2012-08-27 NOTE — Op Note (Signed)
NAMEKENDYL, FESTA NO.:  [id number]  MEDICAL RECORD NO.:  [id number]  LOCATION:  1540                         FACILITY:  [facility]  PHYSICIAN:  Jene Every, M.D.    DATE OF BIRTH:  1951/04/27  DATE OF PROCEDURE:  08/26/2012 DATE OF DISCHARGE:                              OPERATIVE REPORT   PREOPERATIVE DIAGNOSIS:  Rotator cuff tear, right shoulder.  POSTOPERATIVE DIAGNOSIS:  Rotator cuff tear, right shoulder.  PROCEDURE PERFORMED: 1. Mini open rotator cuff repair utilizing Mitek push lock suture     anchors. 2. Tendinosis of the biceps tendon. 3. Subacromial decompression acromioplasty.  ANESTHESIA:  General.  ASSISTANT:  Ms. Lanna Poche, helpful in traction of the arm, traction suction.  HISTORY:  A 62 year old status post dislocation, Hill-Sachs fracture of the humeral head.  Noted on MRI to have rotator cuff tear retracted, indicated for repair due to persistent pain and dysfunction.  Risks and benefits discussed including bleeding, infection, no change in symptoms, worsening symptoms, need for revision, total shoulder arthroplasty, etc.  TECHNIQUE:  With the patient in beach-chair position, after induction of adequate general anesthesia, 2 g Kefzol, the right shoulder and upper extremity was prepped and draped in usual sterile fashion.  Surgical marker was utilized to delineate the acromion.  A 2-cm incision of the anterolateral aspect of the acromion was performed in the skin. Subcutaneous tissue was dissected with electrocautery to achieve hemostasis.  Raphe between the anterolateral heads was identified and divided in line with skin incision preserving the deltoid attachment. Subperiosteal elevated from the spur of the acromion, releasing the CA ligament.  We removed the spur over 3-mm Kerrison.  Self-retaining retractor was placed.  Joint was inspected.  The humeral head was noted. Massive tear was noted.  The defect posteriorly was  noted in the lateral aspect of the articular surface.  Fraying of the biceps tendon pulley. Tear was in the supraspinatus and infraspinatus with portion of that retracted back to the glenoid.  On retrieval, we mobilized the posterior half of the supraspinatus and infraspinatus, advanced it into the lateral aspect of the defect of the bone fracture and into the greater tuberosity.  This was mobilized and digitally lysed of adhesions.  I decorticated the bone medial to the greater tuberosity and lateral to the articular surface with a Theatre manager.  I placed a Mitek suture anchor laterally in that bony trough, impacted it, excellent resistance to pullout.  I advanced the supraspinatus and infraspinatus from posterior medially to anterolaterally, advanced into the trough, tied it with a good surgical knot, leaflet of which I saved for a push lock and put it over the lateral aspect of the greater tuberosity.  Due to persistent defect noted, I used the portion of the biceps tendon which was frayed.  I performed a tenodesis of the biceps decorticating the proximal aspect of the bicipital groove.  I placed a Mitek suture anchor there, threaded it up through the biceps tendon into the anterolateral aspect of the groove, and fixed with a good surgical knot.  The proximal portion of the biceps tendon I used as tenodesis as an incorporation into the repair, repaired it side-to-side  with #1 Vicryl interrupted figure-of-eight sutures to the leading edge of the previously repaired supraspinatus infraspinatus.  Exact full coverage with a side-to-side repair.  Then, the anterior aspect of the remainder of the subscap appeared to be intact.  Again, a good sensation seen in the portion of the supraspinatus which required this corporation of the biceps tendon. This seemed to provide full coverage.  No significant tension with internal or external rotation.  With a redundant suture, heads were pulled over  the greater tuberosity, crossed different directions of pull placing them in the push lock after utilization of awl, insertion of the leaflets into the push lock islet and impaction into the bone with good securing of both hands for a double row repair.  Again, this was performed just prior to this side-to-side repair with biceps tendon in the leading edge of the posterior leaflet.  Wound was copiously irrigated.  Inspection revealed no active bleeding.  I then repaired the raphe with 1 Vicryl interrupted figure-of-eight sutures over top through the acromion preserving the deltoid attachment, subcu with 2-0, skin Prolene.  Wound reinforced with Steri-Strips.  Sterile dressing applied. Abduction pillow placed, extubated without difficulty, and transported to recovery in satisfactory condition.  The patient tolerated the procedure well.  No complications.     Jene Every, M.D.     Cordelia Pen  D:  08/26/2012  T:  08/27/2012  Job:  [id number]

## 2012-08-27 NOTE — Discharge Summary (Signed)
Physician Discharge Summary   Patient ID: Kelli Morales MRN: [id number] DOB/AGE: 1950/10/12 62 y.o.  Admit date: 08/26/2012 Discharge date: 08/27/2012  Primary Diagnosis:   RIGHT SHOULDER ROTATOR CUFF TEAR  Admission Diagnoses:  Past Medical History  Diagnosis Date  . Osteoporosis   . Asthma   . Bipolar disorder   . Migraine   . Ovarian cyst   . Hydatid cyst     Left  . Hypertension   . Depression   . Osteopenia   . Right rotator cuff tear   . GERD (gastroesophageal reflux disease)   . Kidney problem     RT kidney is smaller -> decreased output   Discharge Diagnoses:   Principal Problem:   Right rotator cuff tear  Procedure:  Procedure(s) (LRB): RIGHT SHOULDER MINI OPEN ROTATOR CUFF REPAIR POSSIBLE PATCH GRAFT AND SUBACROMIAL DECOMPRESSION (Right)   Consults: None  HPI:  see H&P    Laboratory Data: Office Visit on 09/23/2011  Component Date Value Range Status  . Color, Urine 09/23/2011 YELLOW  YELLOW Final  . APPearance 09/23/2011 CLOUDY* CLEAR Final  . Specific Gravity, Urine 09/23/2011 1.016  1.005 - 1.030 Final  . pH 09/23/2011 7.5  5.0 - 8.0 Final  . Glucose, UA 09/23/2011 NEG  NEG mg/dL Final  . Bilirubin Urine 09/23/2011 NEG  NEG Final  . Ketones, ur 09/23/2011 NEG  NEG mg/dL Final  . Hgb urine dipstick 09/23/2011 NEG  NEG Final  . Protein, ur 09/23/2011 NEG  NEG mg/dL Final  . Urobilinogen, UA 09/23/2011 1  0.0 - 1.0 mg/dL Final  . Nitrite 08/65/7846 NEG  NEG Final  . Leukocytes, UA 09/23/2011 NEG  NEG Final  . Squamous Epithelial / LPF 09/23/2011 NONE SEEN  RARE Final  . Crystals 09/23/2011 Calcium Oxalate crystals noted  NONE SEEN Final  . Casts 09/23/2011 NONE SEEN  NONE SEEN Final  . WBC, UA 09/23/2011 0-2  <3 WBC/hpf Final  . RBC / HPF 09/23/2011 0-2  <3 RBC/hpf Final  . Bacteria, UA 09/23/2011 NONE SEEN  RARE Final    Recent Labs  08/26/12 1636  HGB 11.6*    Recent Labs  08/26/12 1636  WBC 3.2*  RBC 3.95  HCT 36.5  PLT 123*     Recent Labs  08/26/12 1636  CREATININE 1.36*   No results found for this basename: LABPT, INR,  in the last 72 hours  X-Rays:X-ray Chest Pa Or Ap  08/26/2012  *RADIOLOGY REPORT*  Clinical Data: Asthma.  Hypertension.  Shoulder pain.  Preoperative for rotator cuff repair.  CHEST - 1 VIEW  Comparison: 04/12/2009  Findings: Tortuous thoracic aorta noted.  Cardiac size within normal limits.  No pleural effusion.  The lungs appear clear.  IMPRESSION:  1.  Tortuous thoracic aorta.  Otherwise negative.   Original Report Authenticated By: Gaylyn Rong, M.D.     EKG: Orders placed during the hospital encounter of 08/26/12  . EKG 12-LEAD     Hospital Course: Patient was admitted to [facility] and taken to the OR and underwent the above state procedure without complications.  Patient tolerated the procedure well and was later transferred to the recovery room and then to the orthopaedic floor for postoperative care.  They were given PO and IV analgesics for pain control following their surgery.  They were given 24 hours of postoperative antibiotics.   PT was consulted postop to assist with pt education and pendulum exercises. Discharge planning was consulted to help with postop disposition and  equipment needs.  Patient had a good night on the evening of surgery and started to get up OOB with therapy on day one. Patient was seen in rounds and was ready to go home on day one.  They were given discharge instructions and dressing directions.  They were instructed on when to follow up in the office with Dr. Shelle Iron.  Discharge Medications: Prior to Admission medications   Medication Sig Start Date End Date Taking? Authorizing Provider  Albuterol Sulfate (VENTOLIN HFA IN) Inhale 1 puff into the lungs every 6 (six) hours as needed. wheezing   Yes Historical Provider, MD  ARIPiprazole (ABILIFY) 10 MG tablet Take 10 mg by mouth every morning.    Yes Historical Provider, MD  budesonide  (PULMICORT) 180 MCG/ACT inhaler Inhale 1 puff into the lungs daily.    Yes Historical Provider, MD  clonazePAM (KLONOPIN) 1 MG tablet Take 1 mg by mouth 3 (three) times daily.    Yes Historical Provider, MD  fluticasone (FLONASE) 50 MCG/ACT nasal spray Place 2 sprays into the nose daily.   Yes Historical Provider, MD  lamoTRIgine (LAMICTAL) 100 MG tablet Take 100-200 mg by mouth 2 (two) times daily with breakfast and lunch. Takes 100mg  qAM and 200mg  qPM.   Yes Historical Provider, MD  lansoprazole (PREVACID) 15 MG capsule Take 15 mg by mouth daily.   Yes Historical Provider, MD  losartan-hydrochlorothiazide (HYZAAR) 100-12.5 MG per tablet Take 1 tablet by mouth every morning.    Yes Historical Provider, MD  topiramate (TOPAMAX) 200 MG tablet Take 200 mg by mouth 2 (two) times daily.    Yes Historical Provider, MD  traZODone (DESYREL) 100 MG tablet Take 100 mg by mouth at bedtime.   Yes Historical Provider, MD  zonisamide (ZONEGRAN) 100 MG capsule Take 300 mg by mouth at bedtime.    Yes Historical Provider, MD  aspirin 81 MG tablet Take 81 mg by mouth at bedtime.     Historical Provider, MD  calcium carbonate (OS-CAL) 600 MG TABS Take 600 mg by mouth 2 (two) times daily with a meal.    Historical Provider, MD  cetirizine (ZYRTEC) 10 MG tablet Take 10 mg by mouth daily.    Historical Provider, MD  cholecalciferol (VITAMIN D) 1000 UNITS tablet Take 1,000 Units by mouth daily.    Historical Provider, MD  cyanocobalamin 1000 MCG tablet Take 100 mcg by mouth daily.    Historical Provider, MD  fish oil-omega-3 fatty acids 1000 MG capsule Take 1 g by mouth daily.    Historical Provider, MD  Multiple Vitamin (MULTIVITAMIN WITH MINERALS) TABS Take 1 tablet by mouth daily.    Historical Provider, MD  Multiple Vitamins-Minerals (ICAPS AREDS FORMULA PO) Take 1 tablet by mouth 2 (two) times daily.    Historical Provider, MD  niacin (NIASPAN) 500 MG CR tablet Take 500 mg by mouth daily with supper.    Historical  Provider, MD  OLANZapine (ZYPREXA) 5 MG tablet Take 5 mg by mouth 3 (three) times daily as needed. Takes as needed for headaches.    Historical Provider, MD  oxyCODONE-acetaminophen (PERCOCET) 5-325 MG per tablet Take 1 tablet by mouth every 4 (four) hours as needed for pain. 08/26/12   Javier Docker, MD  promethazine (PHENERGAN) 25 MG tablet Take 25 mg by mouth every 6 (six) hours as needed. Nausea    Historical Provider, MD  tiZANidine (ZANAFLEX) 4 MG capsule Take 4 mg by mouth daily as needed. Takes as needed for headache.  Historical Provider, MD    Diet: Regular diet Activity:WBAT Follow-up:in 10-14 days Disposition - Home Discharged Condition: good   Discharge Orders   Future Appointments Provider Department Dept Phone   09/23/2012 4:00 PM Dara Lords, MD  [facility] [phone]   Future Orders Complete By Expires     Call MD / Call 911  As directed     Comments:      If you experience chest pain or shortness of breath, CALL 911 and be transported to the hospital emergency room.  If you develope a fever above 101 F, pus (white drainage) or increased drainage or redness at the wound, or calf pain, call your surgeon's office.    Constipation Prevention  As directed     Comments:      Drink plenty of fluids.  Prune juice may be helpful.  You may use a stool softener, such as Colace (over the counter) 100 mg twice a day.  Use MiraLax (over the counter) for constipation as needed.    Diet - low sodium heart healthy  As directed     Increase activity slowly as tolerated  As directed         Medication List    TAKE these medications       ARIPiprazole 10 MG tablet  Commonly known as:  ABILIFY  Take 10 mg by mouth every morning.     aspirin 81 MG tablet  Take 81 mg by mouth at bedtime.     budesonide 180 MCG/ACT inhaler  Commonly known as:  PULMICORT  Inhale 1 puff into the lungs daily.     calcium carbonate 600 MG Tabs  Commonly known as:   OS-CAL  Take 600 mg by mouth 2 (two) times daily with a meal.     cetirizine 10 MG tablet  Commonly known as:  ZYRTEC  Take 10 mg by mouth daily.     cholecalciferol 1000 UNITS tablet  Commonly known as:  VITAMIN D  Take 1,000 Units by mouth daily.     clonazePAM 1 MG tablet  Commonly known as:  KLONOPIN  Take 1 mg by mouth 3 (three) times daily.     cyanocobalamin 1000 MCG tablet  Take 100 mcg by mouth daily.     fish oil-omega-3 fatty acids 1000 MG capsule  Take 1 g by mouth daily.     fluticasone 50 MCG/ACT nasal spray  Commonly known as:  FLONASE  Place 2 sprays into the nose daily.     ICAPS AREDS FORMULA PO  Take 1 tablet by mouth 2 (two) times daily.     lamoTRIgine 100 MG tablet  Commonly known as:  LAMICTAL  Take 100-200 mg by mouth 2 (two) times daily with breakfast and lunch. Takes 100mg  qAM and 200mg  qPM.     lansoprazole 15 MG capsule  Commonly known as:  PREVACID  Take 15 mg by mouth daily.     losartan-hydrochlorothiazide 100-12.5 MG per tablet  Commonly known as:  HYZAAR  Take 1 tablet by mouth every morning.     multivitamin with minerals Tabs  Take 1 tablet by mouth daily.     niacin 500 MG CR tablet  Commonly known as:  NIASPAN  Take 500 mg by mouth daily with supper.     OLANZapine 5 MG tablet  Commonly known as:  ZYPREXA  Take 5 mg by mouth 3 (three) times daily as needed. Takes as needed for headaches.     oxyCODONE-acetaminophen  5-325 MG per tablet  Commonly known as:  PERCOCET  Take 1 tablet by mouth every 4 (four) hours as needed for pain.     promethazine 25 MG tablet  Commonly known as:  PHENERGAN  Take 25 mg by mouth every 6 (six) hours as needed. Nausea     tiZANidine 4 MG capsule  Commonly known as:  ZANAFLEX  Take 4 mg by mouth daily as needed. Takes as needed for headache.     topiramate 200 MG tablet  Commonly known as:  TOPAMAX  Take 200 mg by mouth 2 (two) times daily.     traZODone 100 MG tablet  Commonly known  as:  DESYREL  Take 100 mg by mouth at bedtime.     VENTOLIN HFA IN  Inhale 1 puff into the lungs every 6 (six) hours as needed. wheezing     zonisamide 100 MG capsule  Commonly known as:  ZONEGRAN  Take 300 mg by mouth at bedtime.           Follow-up Information   Follow up with BEANE,JEFFREY C, MD In 2 weeks.   Contact information:   [address] [place] [address] [phone]       Signed: Dorothy Spark. 08/27/2012, 11:14 AM

## 2012-08-27 NOTE — Care Management Note (Signed)
    Page 1 of 1   08/27/2012     2:48:19 PM   CARE MANAGEMENT NOTE 08/27/2012  Patient:  Kelli Morales, Kelli Morales   Account Number:  [id number]  Date Initiated:  08/27/2012  Documentation initiated by:  Lanier Clam  Subjective/Objective Assessment:   ADMITTED W/R ROTATOR CUFF TEAR     Action/Plan:   FROM HOME   Anticipated DC Date:  08/27/2012   Anticipated DC Plan:  HOME/SELF CARE      DC Planning Services  CM consult      Choice offered to / List presented to:             Status of service:  Completed, signed off Medicare Important Message given?   (If response is "NO", the following Medicare IM given date fields will be blank) Date Medicare IM given:   Date Additional Medicare IM given:    Discharge Disposition:  HOME/SELF CARE  Per UR Regulation:  Reviewed for med. necessity/level of care/duration of stay  If discussed at Long Length of Stay Meetings, dates discussed:    Comments:  08/27/12 [facility] RN,BSN NCM [phone]

## 2012-09-07 DIAGNOSIS — Z4889 Encounter for other specified surgical aftercare: Secondary | ICD-10-CM | POA: Diagnosis not present

## 2012-09-16 DIAGNOSIS — N3000 Acute cystitis without hematuria: Secondary | ICD-10-CM | POA: Diagnosis not present

## 2012-09-16 DIAGNOSIS — R300 Dysuria: Secondary | ICD-10-CM | POA: Diagnosis not present

## 2012-09-20 DIAGNOSIS — M25519 Pain in unspecified shoulder: Secondary | ICD-10-CM | POA: Diagnosis not present

## 2012-09-23 ENCOUNTER — Ambulatory Visit (INDEPENDENT_AMBULATORY_CARE_PROVIDER_SITE_OTHER): Payer: Medicare Other | Admitting: Gynecology

## 2012-09-23 ENCOUNTER — Encounter: Payer: Self-pay | Admitting: Gynecology

## 2012-09-23 VITALS — BP 124/76 | Ht 65.0 in | Wt 135.0 lb

## 2012-09-23 DIAGNOSIS — M949 Disorder of cartilage, unspecified: Secondary | ICD-10-CM | POA: Diagnosis not present

## 2012-09-23 DIAGNOSIS — M899 Disorder of bone, unspecified: Secondary | ICD-10-CM | POA: Diagnosis not present

## 2012-09-23 DIAGNOSIS — N952 Postmenopausal atrophic vaginitis: Secondary | ICD-10-CM

## 2012-09-23 DIAGNOSIS — N888 Other specified noninflammatory disorders of cervix uteri: Secondary | ICD-10-CM | POA: Diagnosis not present

## 2012-09-23 DIAGNOSIS — M8580 Other specified disorders of bone density and structure, unspecified site: Secondary | ICD-10-CM

## 2012-09-23 NOTE — Progress Notes (Signed)
Kelli Morales 1951/03/01 [id number]        62 y.o.  G1P0010 for followup exam.  Several issues noted below.  Past medical history,surgical history, medications, allergies, family history and social history were all reviewed and documented in the EPIC chart. ROS:  Was performed and pertinent positives and negatives are included in the history.  Exam: CAM assistant Filed Vitals:   09/23/12 1559  BP: 124/76  Height: 5\' 5"  (1.651 m)  Weight: 135 lb (61.236 kg)   General appearance  Normal Skin grossly normal Head/Neck normal with no cervical or supraclavicular adenopathy thyroid normal Lungs  clear Cardiac RR, without RMG Abdominal  soft, nontender, without masses, organomegaly or hernia Breasts  examined lying and sitting without masses, retractions, discharge or axillary adenopathy. Pelvic  Ext/BUS/vagina  normal with mild atrophic changes 1.  Cervix  with small apparent endocervical polyp at the os. Was biopsied off and actually was filled with mucous material. Scant biopsied tissue was sent to pathology.  Silver nitrate applied afterwards  Uterus  anteverted, normal size, shape and contour, midline and mobile nontender   Adnexa  Without masses or tenderness    Anus and perineum  normal   Rectovaginal  normal sphincter tone without palpated masses or tenderness.    Assessment/Plan:  62 y.o. G1P0010 female for followup exam.   1. Small mucous cyst at the cervix. Was biopsied off and fragments sent to pathology. Reviewed with patient may return in adequate as it really did not look like there is a lot of tissue and I think it was just a small benign mucous cyst.  2. Atrophic genital changes/postmenopausal. Patient is asymptomatic and she is not sexually active and not having issues with daily irritation, night sweats hot flashes sleep disturbances. We'll continue to monitor. 3. Osteopenia. DEXA 08/2011 with T score -2.4. 5% loss at the hip and 2.7% loss of the lumbar spine. Had been  on bisphosphonates previously. Had discussed this with Dr. Eda Paschal previously but he was awaiting her renal evaluation. 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 I reviewed with her little nervous about starting medication under these circumstances and would recommend that she discuss with her kidney physician possible treatment either with Reclast or Prolia and see what they would suggest. I would recommend treatment given the loss from her prior study and a T score -2.4 at the hip. Patient agrees to make an appointment with her nephrologist to discuss. Alternative would be referral to Dr. Valarie Cones at [facility] but she would prefer to start with her nephrologist. 4. Mammography 08/2011. Patient knows that she needs to have done. Just had rotator cuff surgery and cannot lift her arms can wait until this heals a little better before scheduling. SBE monthly reviewed. 5. Pap smear 09/2011. No Pap smear done today. No history of abnormal Pap smears previously. Plan 3 year repeat interval. 6. Colonoscopy 2009. Recommended repeat at 10 year interval and she'll followup for this. 7. Health maintenance. The lab work done as this is all done through her other physician's offices. Followup with her nephrologist in reference to the osteopenia issue otherwise 1 year.   Dara Lords MD, 4:32 PM 09/23/2012

## 2012-09-23 NOTE — Patient Instructions (Signed)
Followup with your kidney doctor to discuss possible osteoporosis treatment. Followup with her GYN doctor in one year.

## 2012-09-27 DIAGNOSIS — M25519 Pain in unspecified shoulder: Secondary | ICD-10-CM | POA: Diagnosis not present

## 2012-10-01 DIAGNOSIS — M25519 Pain in unspecified shoulder: Secondary | ICD-10-CM | POA: Diagnosis not present

## 2012-10-04 DIAGNOSIS — M25519 Pain in unspecified shoulder: Secondary | ICD-10-CM | POA: Diagnosis not present

## 2012-10-12 DIAGNOSIS — M25519 Pain in unspecified shoulder: Secondary | ICD-10-CM | POA: Diagnosis not present

## 2012-10-18 DIAGNOSIS — M25519 Pain in unspecified shoulder: Secondary | ICD-10-CM | POA: Diagnosis not present

## 2012-10-20 DIAGNOSIS — M25519 Pain in unspecified shoulder: Secondary | ICD-10-CM | POA: Diagnosis not present

## 2012-10-25 DIAGNOSIS — M25519 Pain in unspecified shoulder: Secondary | ICD-10-CM | POA: Diagnosis not present

## 2012-10-29 DIAGNOSIS — M25519 Pain in unspecified shoulder: Secondary | ICD-10-CM | POA: Diagnosis not present

## 2012-11-03 DIAGNOSIS — M25519 Pain in unspecified shoulder: Secondary | ICD-10-CM | POA: Diagnosis not present

## 2012-11-03 DIAGNOSIS — F3177 Bipolar disorder, in partial remission, most recent episode mixed: Secondary | ICD-10-CM | POA: Diagnosis not present

## 2012-11-05 DIAGNOSIS — M25519 Pain in unspecified shoulder: Secondary | ICD-10-CM | POA: Diagnosis not present

## 2012-11-08 DIAGNOSIS — M25519 Pain in unspecified shoulder: Secondary | ICD-10-CM | POA: Diagnosis not present

## 2012-11-09 DIAGNOSIS — G43019 Migraine without aura, intractable, without status migrainosus: Secondary | ICD-10-CM | POA: Diagnosis not present

## 2012-11-09 DIAGNOSIS — G43719 Chronic migraine without aura, intractable, without status migrainosus: Secondary | ICD-10-CM | POA: Diagnosis not present

## 2012-11-12 DIAGNOSIS — M25519 Pain in unspecified shoulder: Secondary | ICD-10-CM | POA: Diagnosis not present

## 2012-11-15 DIAGNOSIS — M25519 Pain in unspecified shoulder: Secondary | ICD-10-CM | POA: Diagnosis not present

## 2012-11-19 DIAGNOSIS — M25519 Pain in unspecified shoulder: Secondary | ICD-10-CM | POA: Diagnosis not present

## 2012-11-22 DIAGNOSIS — M25519 Pain in unspecified shoulder: Secondary | ICD-10-CM | POA: Diagnosis not present

## 2012-11-23 DIAGNOSIS — Z4889 Encounter for other specified surgical aftercare: Secondary | ICD-10-CM | POA: Diagnosis not present

## 2012-11-25 DIAGNOSIS — H3530 Unspecified macular degeneration: Secondary | ICD-10-CM | POA: Diagnosis not present

## 2012-11-26 DIAGNOSIS — M25519 Pain in unspecified shoulder: Secondary | ICD-10-CM | POA: Diagnosis not present

## 2012-11-29 DIAGNOSIS — M25519 Pain in unspecified shoulder: Secondary | ICD-10-CM | POA: Diagnosis not present

## 2012-12-01 DIAGNOSIS — M25519 Pain in unspecified shoulder: Secondary | ICD-10-CM | POA: Diagnosis not present

## 2012-12-03 DIAGNOSIS — M25519 Pain in unspecified shoulder: Secondary | ICD-10-CM | POA: Diagnosis not present

## 2012-12-13 DIAGNOSIS — M25519 Pain in unspecified shoulder: Secondary | ICD-10-CM | POA: Diagnosis not present

## 2012-12-17 DIAGNOSIS — M25519 Pain in unspecified shoulder: Secondary | ICD-10-CM | POA: Diagnosis not present

## 2012-12-24 ENCOUNTER — Ambulatory Visit (INDEPENDENT_AMBULATORY_CARE_PROVIDER_SITE_OTHER): Payer: Medicare Other | Admitting: Gynecology

## 2012-12-24 ENCOUNTER — Encounter: Payer: Self-pay | Admitting: Gynecology

## 2012-12-24 DIAGNOSIS — N888 Other specified noninflammatory disorders of cervix uteri: Secondary | ICD-10-CM | POA: Diagnosis not present

## 2012-12-24 NOTE — Progress Notes (Signed)
Patient presents for followup cervix exam. At visit 09/2012 she had what appeared to be an endocervical polyp. This was biopsied and it turned out to be a mucous cyst which pretty much disintegrated. Sample was sent to pathology but they really could not see much tissue. I asked the patient to come back in several months for reinspection just to make sure there was no recurrent growth.  Exam with Berenice Bouton External BUS vagina with atrophic changes. Cervix normal without any evidence of recurrent cyst or polyps. Uterus normal size midline mobile nontender. Adnexa without masses or tenderness.  Assessment and plan: Normal GYN exam. Followup in April 2015 for annual exam.

## 2012-12-24 NOTE — Patient Instructions (Signed)
Followup April 2015 for annual gynecologic exam.

## 2012-12-27 DIAGNOSIS — M25519 Pain in unspecified shoulder: Secondary | ICD-10-CM | POA: Diagnosis not present

## 2012-12-28 DIAGNOSIS — S43429A Sprain of unspecified rotator cuff capsule, initial encounter: Secondary | ICD-10-CM | POA: Diagnosis not present

## 2012-12-31 ENCOUNTER — Ambulatory Visit: Payer: Medicare Other | Admitting: Gynecology

## 2012-12-31 DIAGNOSIS — M25519 Pain in unspecified shoulder: Secondary | ICD-10-CM | POA: Diagnosis not present

## 2013-01-03 DIAGNOSIS — Z1231 Encounter for screening mammogram for malignant neoplasm of breast: Secondary | ICD-10-CM | POA: Diagnosis not present

## 2013-01-04 DIAGNOSIS — M25519 Pain in unspecified shoulder: Secondary | ICD-10-CM | POA: Diagnosis not present

## 2013-01-07 DIAGNOSIS — M25519 Pain in unspecified shoulder: Secondary | ICD-10-CM | POA: Diagnosis not present

## 2013-01-10 DIAGNOSIS — M25519 Pain in unspecified shoulder: Secondary | ICD-10-CM | POA: Diagnosis not present

## 2013-01-14 DIAGNOSIS — M25519 Pain in unspecified shoulder: Secondary | ICD-10-CM | POA: Diagnosis not present

## 2013-01-24 DIAGNOSIS — Z4889 Encounter for other specified surgical aftercare: Secondary | ICD-10-CM | POA: Diagnosis not present

## 2013-01-24 DIAGNOSIS — S43429A Sprain of unspecified rotator cuff capsule, initial encounter: Secondary | ICD-10-CM | POA: Diagnosis not present

## 2013-01-24 DIAGNOSIS — M25519 Pain in unspecified shoulder: Secondary | ICD-10-CM | POA: Diagnosis not present

## 2013-01-25 DIAGNOSIS — M25519 Pain in unspecified shoulder: Secondary | ICD-10-CM | POA: Diagnosis not present

## 2013-01-28 DIAGNOSIS — M25519 Pain in unspecified shoulder: Secondary | ICD-10-CM | POA: Diagnosis not present

## 2013-01-31 DIAGNOSIS — M25519 Pain in unspecified shoulder: Secondary | ICD-10-CM | POA: Diagnosis not present

## 2013-02-04 DIAGNOSIS — M25519 Pain in unspecified shoulder: Secondary | ICD-10-CM | POA: Diagnosis not present

## 2013-02-18 DIAGNOSIS — H53009 Unspecified amblyopia, unspecified eye: Secondary | ICD-10-CM | POA: Diagnosis not present

## 2013-02-18 DIAGNOSIS — H43829 Vitreomacular adhesion, unspecified eye: Secondary | ICD-10-CM | POA: Diagnosis not present

## 2013-02-18 DIAGNOSIS — H2510 Age-related nuclear cataract, unspecified eye: Secondary | ICD-10-CM | POA: Diagnosis not present

## 2013-03-01 DIAGNOSIS — H524 Presbyopia: Secondary | ICD-10-CM | POA: Diagnosis not present

## 2013-03-01 DIAGNOSIS — H5210 Myopia, unspecified eye: Secondary | ICD-10-CM | POA: Diagnosis not present

## 2013-03-01 DIAGNOSIS — H2510 Age-related nuclear cataract, unspecified eye: Secondary | ICD-10-CM | POA: Diagnosis not present

## 2013-03-02 DIAGNOSIS — F3177 Bipolar disorder, in partial remission, most recent episode mixed: Secondary | ICD-10-CM | POA: Diagnosis not present

## 2013-04-04 DIAGNOSIS — I129 Hypertensive chronic kidney disease with stage 1 through stage 4 chronic kidney disease, or unspecified chronic kidney disease: Secondary | ICD-10-CM | POA: Diagnosis not present

## 2013-04-04 DIAGNOSIS — Z79899 Other long term (current) drug therapy: Secondary | ICD-10-CM | POA: Diagnosis not present

## 2013-04-04 DIAGNOSIS — F319 Bipolar disorder, unspecified: Secondary | ICD-10-CM | POA: Diagnosis not present

## 2013-04-04 DIAGNOSIS — J45909 Unspecified asthma, uncomplicated: Secondary | ICD-10-CM | POA: Diagnosis not present

## 2013-04-04 DIAGNOSIS — J309 Allergic rhinitis, unspecified: Secondary | ICD-10-CM | POA: Diagnosis not present

## 2013-04-14 ENCOUNTER — Other Ambulatory Visit: Payer: Self-pay

## 2013-05-26 DIAGNOSIS — G43719 Chronic migraine without aura, intractable, without status migrainosus: Secondary | ICD-10-CM | POA: Diagnosis not present

## 2013-05-26 DIAGNOSIS — Z79899 Other long term (current) drug therapy: Secondary | ICD-10-CM | POA: Diagnosis not present

## 2013-05-26 DIAGNOSIS — G43019 Migraine without aura, intractable, without status migrainosus: Secondary | ICD-10-CM | POA: Diagnosis not present

## 2013-06-12 DIAGNOSIS — K5900 Constipation, unspecified: Secondary | ICD-10-CM | POA: Diagnosis not present

## 2013-06-12 DIAGNOSIS — R109 Unspecified abdominal pain: Secondary | ICD-10-CM | POA: Diagnosis not present

## 2013-06-14 DIAGNOSIS — K5900 Constipation, unspecified: Secondary | ICD-10-CM | POA: Diagnosis not present

## 2013-06-14 DIAGNOSIS — D649 Anemia, unspecified: Secondary | ICD-10-CM | POA: Diagnosis not present

## 2013-06-28 DIAGNOSIS — F3177 Bipolar disorder, in partial remission, most recent episode mixed: Secondary | ICD-10-CM | POA: Diagnosis not present

## 2013-06-29 DIAGNOSIS — R198 Other specified symptoms and signs involving the digestive system and abdomen: Secondary | ICD-10-CM | POA: Diagnosis not present

## 2013-06-29 DIAGNOSIS — K648 Other hemorrhoids: Secondary | ICD-10-CM | POA: Diagnosis not present

## 2013-06-29 DIAGNOSIS — K5900 Constipation, unspecified: Secondary | ICD-10-CM | POA: Diagnosis not present

## 2013-06-29 DIAGNOSIS — K921 Melena: Secondary | ICD-10-CM | POA: Diagnosis not present

## 2013-07-05 ENCOUNTER — Telehealth: Payer: Self-pay | Admitting: *Deleted

## 2013-07-05 NOTE — Telephone Encounter (Signed)
Pt called c/o severe vaginal dryness, pt is now sexual activity asked if Rx could be given to help with this? Annual was in April/2014. Please advise

## 2013-07-05 NOTE — Telephone Encounter (Signed)
Pt informed with the below note. 

## 2013-07-05 NOTE — Telephone Encounter (Signed)
This is an office visit exam/discussion appointment, not an over the phone

## 2013-07-08 ENCOUNTER — Encounter: Payer: Self-pay | Admitting: Gynecology

## 2013-07-08 ENCOUNTER — Ambulatory Visit (INDEPENDENT_AMBULATORY_CARE_PROVIDER_SITE_OTHER): Payer: Medicare Other | Admitting: Gynecology

## 2013-07-08 DIAGNOSIS — IMO0002 Reserved for concepts with insufficient information to code with codable children: Secondary | ICD-10-CM | POA: Diagnosis not present

## 2013-07-08 DIAGNOSIS — N9489 Other specified conditions associated with female genital organs and menstrual cycle: Secondary | ICD-10-CM | POA: Diagnosis not present

## 2013-07-08 DIAGNOSIS — N898 Other specified noninflammatory disorders of vagina: Secondary | ICD-10-CM

## 2013-07-08 NOTE — Progress Notes (Signed)
Patient presents to discuss her worsening vaginal dryness and dyspareunia. Have discussed this with Dr. Cherylann Banas in the past but was reluctant to consider medication. Notes now that is becoming a quality of life issue and area uncomfortable and wants to discuss options. She has tried over-the-counter products without success. Not having significant global symptoms such as hot flushes or night sweats.  Exam with Irven Shelling External BUS vagina with significant atrophic changes. Cervix atrophic grossly normal in appearance. Uterus normal size midline mobile nontender. Adnexa without masses or tenderness.  Assessment and plan: Atrophic vaginal changes leading to vaginal dryness and dyspareunia. Options to include OTC products, Vagifem, estrogen vaginal cream, Estring, Osphena reviewed. Is not interested in considering global HRT. I reviewed with vaginal products the risk of absorption and risks to include stroke heart attack DVT, breast cancer and endometrial stimulation. Issues with Osphena to include side effect profile such as hot flushes and limited long-term data reviewed. After lengthy discussion the patient wants to try Vagifem and I gave her 18 samples. She'll start nightly x12 and then twice weekly x3. She'll call me at the end of the third week to see how she's doing and for refill or rediscussion of alternatives.

## 2013-07-08 NOTE — Patient Instructions (Signed)
Call me at the end of the Vagifem samples to let me know how you're doing.

## 2013-08-09 ENCOUNTER — Other Ambulatory Visit: Payer: Self-pay | Admitting: Gastroenterology

## 2013-08-09 DIAGNOSIS — D126 Benign neoplasm of colon, unspecified: Secondary | ICD-10-CM | POA: Diagnosis not present

## 2013-08-09 DIAGNOSIS — K921 Melena: Secondary | ICD-10-CM | POA: Diagnosis not present

## 2013-08-09 DIAGNOSIS — K648 Other hemorrhoids: Secondary | ICD-10-CM | POA: Diagnosis not present

## 2013-09-28 DIAGNOSIS — F3177 Bipolar disorder, in partial remission, most recent episode mixed: Secondary | ICD-10-CM | POA: Diagnosis not present

## 2013-10-04 ENCOUNTER — Encounter: Payer: Self-pay | Admitting: Gynecology

## 2013-10-04 ENCOUNTER — Ambulatory Visit (INDEPENDENT_AMBULATORY_CARE_PROVIDER_SITE_OTHER): Payer: Medicare Other | Admitting: Gynecology

## 2013-10-04 VITALS — BP 120/78 | Ht 65.0 in | Wt 130.0 lb

## 2013-10-04 DIAGNOSIS — M899 Disorder of bone, unspecified: Secondary | ICD-10-CM | POA: Diagnosis not present

## 2013-10-04 DIAGNOSIS — M949 Disorder of cartilage, unspecified: Secondary | ICD-10-CM

## 2013-10-04 DIAGNOSIS — R82998 Other abnormal findings in urine: Secondary | ICD-10-CM | POA: Diagnosis not present

## 2013-10-04 DIAGNOSIS — N952 Postmenopausal atrophic vaginitis: Secondary | ICD-10-CM | POA: Diagnosis not present

## 2013-10-04 DIAGNOSIS — M8580 Other specified disorders of bone density and structure, unspecified site: Secondary | ICD-10-CM

## 2013-10-04 NOTE — Progress Notes (Addendum)
[place] [birth year] [id number]        63 y.o.  G1P0010 for followup exam.  Several issues noted below.  Past medical history,surgical history, problem list, medications, allergies, family history and social history were all reviewed and documented as reviewed in the EPIC chart.  ROS:  12 system ROS performed with pertinent positives and negatives included in the history, assessment and plan.  Included Systems: General, HEENT, Neck, Cardiovascular, Pulmonary, Gastrointestinal, Genitourinary, Musculoskeletal, Dermatologic, Endocrine, Hematological, Neurologic, Psychiatric Additional significant findings : None   Exam: Kelli Morales Filed Vitals:   10/04/13 1455  BP: 120/78  Height: 5\' 5"  (1.651 m)  Weight: 130 lb (58.968 kg)   General appearance:  Normal affect, orientation and appearance. Skin: Grossly normal HEENT: Without gross lesions.  No cervical or supraclavicular adenopathy. Thyroid normal.  Lungs:  Clear without wheezing, rales or rhonchi Cardiac: RR, without RMG Abdominal:  Soft, nontender, without masses, guarding, rebound, organomegaly or hernia Breasts:  Examined lying and sitting without masses, retractions, discharge or axillary adenopathy. Pelvic:  Ext/BUS/vagina with generalized atrophic changes  Cervix with atrophic changes  Uterus anteverted, normal size, shape and contour, midline and mobile nontender   Adnexa  Without masses or tenderness    Anus and perineum  Normal   Rectovaginal  Normal sphincter tone without palpated masses or tenderness.    Assessment/Plan:  63 y.o. G95P0010 female for annual exam.   1. Atrophic genital changes/post menopausal. Was given sample of Vagifem in January 2015. Reports doing well with this but did not continue because her husband is having issues with impotence due to his diabetes. She is not having vaginal dryness per se but mostly pain with intercourse. No significant hot flashes, night sweats or any vaginal bleeding. Will  hold on treatment at this point and she will call once her husband's issues are resolved and she resumes intercourse and will restart the Vagifem as she did have a good response to this. Patient knows to report any vaginal bleeding. 2. Osteopenia. DEXA 08/2011 T score -2.4. Have been discussed treatment options with Dr. Cherylann Banas and myself. Never further discussed with her nephrologist as I had recommended previously. Is being followed for renal disease. The issue is whether bisphosphonate versus Prolia would be appropriate and whether a dose adjustment is needed. Recommend repeat DEXA now at two-year interval and she's going to schedule in July when she gets her mammogram and then we'll further discuss treatment options. I had previously discussed referral to Dr. Edmonia James [place] and this may be an option once we review her bone density. Patient knows to call me after she has the DEXA so that we can discuss the results. 3. Pap smear 2013. No Pap smear done today. No history of significant abnormal Pap smears. Plan repeat Pap smear next year at 3 year interval per current screening guidelines. 4. Mammography planned in July and patient will followup for this. SBE monthly reviewed. 5. Colonoscopy 08/2013. Repeat at their recommended interval. 6. Health maintenance. No routine blood work done as this is all done through her other physician's offices. Followup for DEXA and discussion otherwise followup in 1 year.   Note: This document was prepared with digital dictation and possible smart phrase technology. Any transcriptional errors that result from this process are unintentional.   Anastasio Auerbach MD, 3:29 PM 10/04/2013

## 2013-10-04 NOTE — Patient Instructions (Signed)
Followup for bone density in July when you have your mammogram. Call me afterwards so we can discuss treatment plan.  Followup in one year for annual exam.

## 2013-10-05 LAB — URINALYSIS W MICROSCOPIC + REFLEX CULTURE
Bacteria, UA: NONE SEEN
Bilirubin Urine: NEGATIVE
Casts: NONE SEEN
Crystals: NONE SEEN
Glucose, UA: NEGATIVE mg/dL
Ketones, ur: NEGATIVE mg/dL
Leukocytes, UA: NEGATIVE
Nitrite: NEGATIVE
Protein, ur: NEGATIVE mg/dL
RBC / HPF: >50 RBC/hpf — AB (ref <3)
Specific Gravity, Urine: 1.015 (ref 1.005–1.030)
Squamous Epithelial / HPF: NONE SEEN
Urobilinogen, UA: 0.2 mg/dL (ref 0.0–1.0)
pH: 7.5 (ref 5.0–8.0)

## 2013-10-06 LAB — URINE CULTURE
COLONY COUNT: NO GROWTH
ORGANISM ID, BACTERIA: NO GROWTH

## 2013-10-07 ENCOUNTER — Other Ambulatory Visit: Payer: Self-pay | Admitting: Gynecology

## 2013-10-07 DIAGNOSIS — R3129 Other microscopic hematuria: Secondary | ICD-10-CM

## 2013-10-17 ENCOUNTER — Telehealth: Payer: Self-pay | Admitting: *Deleted

## 2013-10-17 MED ORDER — ESTRADIOL 10 MCG VA TABS: 1.0000 | ORAL_TABLET | VAGINAL | Status: DC

## 2013-10-17 NOTE — Telephone Encounter (Signed)
Refill Vagifem 10 micrograms twice weekly #8 refill through 09/2014

## 2013-10-17 NOTE — Telephone Encounter (Signed)
rx sent, pt informed.  

## 2013-10-17 NOTE — Telephone Encounter (Signed)
(  pt is aware you are out of the office) Pt called requesting a Rx for vagifem due to vaginal dryness. Please advise

## 2013-10-21 ENCOUNTER — Other Ambulatory Visit: Payer: 59

## 2013-10-21 DIAGNOSIS — R3129 Other microscopic hematuria: Secondary | ICD-10-CM

## 2013-10-22 LAB — URINALYSIS W MICROSCOPIC + REFLEX CULTURE
Bacteria, UA: NONE SEEN
Bilirubin Urine: NEGATIVE
CASTS: NONE SEEN
CRYSTALS: NONE SEEN
Glucose, UA: NEGATIVE mg/dL
Hgb urine dipstick: NEGATIVE
Ketones, ur: NEGATIVE mg/dL
LEUKOCYTES UA: NEGATIVE
NITRITE: NEGATIVE
PH: 7 (ref 5.0–8.0)
Protein, ur: NEGATIVE mg/dL
SPECIFIC GRAVITY, URINE: 1.015 (ref 1.005–1.030)
Squamous Epithelial / LPF: NONE SEEN
UROBILINOGEN UA: 0.2 mg/dL (ref 0.0–1.0)

## 2013-10-27 ENCOUNTER — Telehealth: Payer: Self-pay | Admitting: *Deleted

## 2013-10-27 NOTE — Telephone Encounter (Signed)
Pt called regarding u/a done on 10/21/13 pt informed negative for RBC in urine.

## 2013-11-08 DIAGNOSIS — D239 Other benign neoplasm of skin, unspecified: Secondary | ICD-10-CM | POA: Diagnosis not present

## 2013-11-08 DIAGNOSIS — L439 Lichen planus, unspecified: Secondary | ICD-10-CM | POA: Diagnosis not present

## 2013-11-10 DIAGNOSIS — G43719 Chronic migraine without aura, intractable, without status migrainosus: Secondary | ICD-10-CM | POA: Diagnosis not present

## 2013-11-10 DIAGNOSIS — G43019 Migraine without aura, intractable, without status migrainosus: Secondary | ICD-10-CM | POA: Diagnosis not present

## 2013-11-15 ENCOUNTER — Telehealth: Payer: Self-pay | Admitting: *Deleted

## 2013-11-15 NOTE — Telephone Encounter (Signed)
Pt called c/o headaches since taking vagifem 10 mcg prescribed on 10/17/13. Pt does have history of migraines, steady headache every day, took OTC and no relief and tried migraine medication and that didn't help as well. Pt asked if there are other options? Please advise

## 2013-11-16 NOTE — Telephone Encounter (Signed)
Left detailed message on her cell phone to call back and schedule cons to discuss options.

## 2013-11-16 NOTE — Telephone Encounter (Signed)
There are several other options. Office visit to discuss

## 2013-11-29 DIAGNOSIS — R634 Abnormal weight loss: Secondary | ICD-10-CM | POA: Diagnosis not present

## 2013-11-29 DIAGNOSIS — M25559 Pain in unspecified hip: Secondary | ICD-10-CM | POA: Diagnosis not present

## 2013-11-29 DIAGNOSIS — J45909 Unspecified asthma, uncomplicated: Secondary | ICD-10-CM | POA: Diagnosis not present

## 2013-11-29 DIAGNOSIS — I129 Hypertensive chronic kidney disease with stage 1 through stage 4 chronic kidney disease, or unspecified chronic kidney disease: Secondary | ICD-10-CM | POA: Diagnosis not present

## 2013-11-29 DIAGNOSIS — N183 Chronic kidney disease, stage 3 unspecified: Secondary | ICD-10-CM | POA: Diagnosis not present

## 2013-12-05 ENCOUNTER — Ambulatory Visit (INDEPENDENT_AMBULATORY_CARE_PROVIDER_SITE_OTHER): Payer: Medicare Other | Admitting: Gynecology

## 2013-12-05 ENCOUNTER — Encounter: Payer: Self-pay | Admitting: Gynecology

## 2013-12-05 DIAGNOSIS — N952 Postmenopausal atrophic vaginitis: Secondary | ICD-10-CM

## 2013-12-05 NOTE — Progress Notes (Signed)
[place] 02/04/1951 [id number]        63 y.o.  G1P0010 presents complaining of a migraine-like headache while on Vagifem. She did well as far as vaginal dryness and after 2 months noticed the onset of a bad headache that required Advil for relief that normally she should not take due to her renal disease. She stopped her Vagifem and ultimately her headache went away. She presents to discuss options as far as her vaginal dryness is concerned.  Past medical history,surgical history, problem list, medications, allergies, family history and social history were all reviewed and documented in the EPIC chart.  Directed ROS with pertinent positives and negatives documented in the history of present illness/assessment and plan.   Assessment/Plan:  63 y.o. G1P0010 with headache while on Vagifem. I reviewed with her a little unusual in that there is limited absorption and that usually it is discontinuing estrogen that triggers a migraine-like headache. She also been on it for 2 months without problems. I question whether it was unrelated and that there was some other trigger for her headache. Options to include OTC moisturizers, restarting Vagifem, vaginal estrogen cream and Osphena all reviewed. Ultimately since she had such a great response as far as her vaginal dryness she wants to retry the Vagifem. We'll start with once weekly for the first month and see how she does. If she has a good response vaginally then she'll stay at once weekly. If she continues with some vaginal dryness and without headaches then we'll go to twice weekly. If she develops headaches or any other issues she'll call me.   Note: This document was prepared with digital dictation and possible smart phrase technology. Any transcriptional errors that result from this process are unintentional.   Anastasio Auerbach MD, 2:40 PM 12/05/2013

## 2013-12-05 NOTE — Patient Instructions (Signed)
Vagifem as we discussed once weekly. If you do well with this and stay at the once weekly dose. If you continue without headaches but still are having some vaginal dryness then go to twice a week. If you have any questions let me know.

## 2014-01-03 DIAGNOSIS — F3177 Bipolar disorder, in partial remission, most recent episode mixed: Secondary | ICD-10-CM | POA: Diagnosis not present

## 2014-01-07 DIAGNOSIS — M810 Age-related osteoporosis without current pathological fracture: Secondary | ICD-10-CM

## 2014-01-07 HISTORY — DX: Age-related osteoporosis without current pathological fracture: M81.0

## 2014-01-12 DIAGNOSIS — M899 Disorder of bone, unspecified: Secondary | ICD-10-CM | POA: Diagnosis not present

## 2014-01-12 DIAGNOSIS — Z1231 Encounter for screening mammogram for malignant neoplasm of breast: Secondary | ICD-10-CM | POA: Diagnosis not present

## 2014-01-12 DIAGNOSIS — Z8262 Family history of osteoporosis: Secondary | ICD-10-CM | POA: Diagnosis not present

## 2014-01-12 DIAGNOSIS — M949 Disorder of cartilage, unspecified: Secondary | ICD-10-CM | POA: Diagnosis not present

## 2014-01-17 ENCOUNTER — Encounter: Payer: Self-pay | Admitting: Gynecology

## 2014-01-24 ENCOUNTER — Telehealth: Payer: Self-pay | Admitting: Gynecology

## 2014-01-24 ENCOUNTER — Encounter: Payer: Self-pay | Admitting: Gynecology

## 2014-01-24 NOTE — Telephone Encounter (Signed)
PT INFORMED WITH THE BELOW NOTE, PT IS WORKING WITH PCP REGARDING PROLIA INJECTION. PT IS WAITING TO SEE IF PROLIA IS COVERED BY HER INSURANCE AND WILL PROCEED WITH THIS OPTION .

## 2014-01-24 NOTE — Telephone Encounter (Signed)
Tell patient that her bone density does show osteoporosis. It seems that she has lost some ground as far as her bone density is concerned. I think she needs to talk about treatment options and given her medical issues particularly her kidneys I think the best choice would be to talk to Dr. Edmonia James at [facility] who specializes in this area. Help patient make arrangements with appointment.

## 2014-01-26 DIAGNOSIS — M810 Age-related osteoporosis without current pathological fracture: Secondary | ICD-10-CM | POA: Diagnosis not present

## 2014-02-09 DIAGNOSIS — F3177 Bipolar disorder, in partial remission, most recent episode mixed: Secondary | ICD-10-CM | POA: Diagnosis not present

## 2014-02-17 DIAGNOSIS — H02409 Unspecified ptosis of unspecified eyelid: Secondary | ICD-10-CM | POA: Diagnosis not present

## 2014-03-02 DIAGNOSIS — H5210 Myopia, unspecified eye: Secondary | ICD-10-CM | POA: Diagnosis not present

## 2014-03-02 DIAGNOSIS — H524 Presbyopia: Secondary | ICD-10-CM | POA: Diagnosis not present

## 2014-03-02 DIAGNOSIS — H25019 Cortical age-related cataract, unspecified eye: Secondary | ICD-10-CM | POA: Diagnosis not present

## 2014-03-02 DIAGNOSIS — H2510 Age-related nuclear cataract, unspecified eye: Secondary | ICD-10-CM | POA: Diagnosis not present

## 2014-04-07 ENCOUNTER — Telehealth: Payer: Self-pay | Admitting: *Deleted

## 2014-04-07 NOTE — Telephone Encounter (Signed)
Pt informed and will let us know when she needs more refills. KW CMA

## 2014-04-07 NOTE — Telephone Encounter (Signed)
Pt called to ask if she can use the Vagifem more than 2x week. She is not getting the relief of vaginal dryness using just 2xwk. Ok to increase? Pls advise.

## 2014-04-07 NOTE — Telephone Encounter (Signed)
Okay to increase to 3 times weekly

## 2014-04-10 ENCOUNTER — Encounter: Payer: Self-pay | Admitting: Gynecology

## 2014-05-09 DIAGNOSIS — G43019 Migraine without aura, intractable, without status migrainosus: Secondary | ICD-10-CM | POA: Diagnosis not present

## 2014-05-09 DIAGNOSIS — G43719 Chronic migraine without aura, intractable, without status migrainosus: Secondary | ICD-10-CM | POA: Diagnosis not present

## 2014-05-10 ENCOUNTER — Telehealth: Payer: Self-pay | Admitting: *Deleted

## 2014-05-10 NOTE — Telephone Encounter (Signed)
Pt currently takes vagifem 10 mcg as of Jan 1st the price for medication will be $90 per month. Pt said this is too expensive and asked if something similar to this could be prescribed? Please advise

## 2014-05-11 MED ORDER — NONFORMULARY OR COMPOUNDED ITEM: Status: DC

## 2014-05-11 NOTE — Telephone Encounter (Signed)
Can try the formulated vaginal estrogen cream through [facility] or [facility] twice weekly

## 2014-05-11 NOTE — Telephone Encounter (Signed)
Pt was fine with Rx asked me to mail Rx to her and she will take to pharmacy. Pt won't need Rx until after Jan 1.

## 2014-06-05 DIAGNOSIS — F3177 Bipolar disorder, in partial remission, most recent episode mixed: Secondary | ICD-10-CM | POA: Diagnosis not present

## 2014-12-05 ENCOUNTER — Ambulatory Visit
Admission: RE | Admit: 2014-12-05 | Discharge: 2014-12-05 | Disposition: A | Discharge status: Home / Self Care | Payer: Medicare Other | Source: Ambulatory Visit | Attending: Family Medicine | Admitting: Family Medicine

## 2014-12-05 ENCOUNTER — Other Ambulatory Visit: Payer: Self-pay | Admitting: Family Medicine

## 2014-12-05 DIAGNOSIS — M25551 Pain in right hip: Secondary | ICD-10-CM

## 2014-12-20 ENCOUNTER — Ambulatory Visit (INDEPENDENT_AMBULATORY_CARE_PROVIDER_SITE_OTHER): Payer: Medicare Other | Admitting: Gynecology

## 2014-12-20 ENCOUNTER — Encounter: Payer: Self-pay | Admitting: Gynecology

## 2014-12-20 ENCOUNTER — Other Ambulatory Visit (HOSPITAL_COMMUNITY)
Admission: RE | Admit: 2014-12-20 | Discharge: 2014-12-20 | Disposition: A | Discharge status: Home / Self Care | Payer: Medicare Other | Source: Ambulatory Visit | Attending: Gynecology | Admitting: Gynecology

## 2014-12-20 VITALS — BP 120/76 | Ht 66.0 in | Wt 126.0 lb

## 2014-12-20 DIAGNOSIS — Z124 Encounter for screening for malignant neoplasm of cervix: Secondary | ICD-10-CM | POA: Insufficient documentation

## 2014-12-20 DIAGNOSIS — Z01419 Encounter for gynecological examination (general) (routine) without abnormal findings: Secondary | ICD-10-CM | POA: Diagnosis not present

## 2014-12-20 DIAGNOSIS — N952 Postmenopausal atrophic vaginitis: Secondary | ICD-10-CM | POA: Diagnosis not present

## 2014-12-20 DIAGNOSIS — M810 Age-related osteoporosis without current pathological fracture: Secondary | ICD-10-CM

## 2014-12-20 NOTE — Progress Notes (Signed)
[place] [birth year] [id number]        64 y.o.  G2P0011 for breast and pelvic exam.  Several issues noted below.  Past medical history,surgical history, problem list, medications, allergies, family history and social history were all reviewed and documented as reviewed in the EPIC chart.  ROS:  Performed with pertinent positives and negatives included in the history, assessment and plan.   Additional significant findings :  none   Exam: Kim Counsellor Vitals:   12/20/14 1443  BP: 120/76  Height: 5\' 6"  (1.676 m)  Weight: 126 lb (57.153 kg)   General appearance:  Normal affect, orientation and appearance. Skin: Grossly normal HEENT: Without gross lesions.  No cervical or supraclavicular adenopathy. Thyroid normal.  Lungs:  Clear without wheezing, rales or rhonchi Cardiac: RR, without RMG Abdominal:  Soft, nontender, without masses, guarding, rebound, organomegaly or hernia Breasts:  Examined lying and sitting without masses, retractions, discharge or axillary adenopathy. Pelvic:  Ext/BUS/vagina with atrophic changes  Cervix with atrophic changes. Pap smear done  Uterus anteverted, normal size, shape and contour, midline and mobile nontender   Adnexa  Without masses or tenderness    Anus and perineum  Normal   Rectovaginal  Normal sphincter tone without palpated masses or tenderness.    Assessment/Plan:  64 y.o. G18P0011 female for breast and pelvic exam.   1. Postmenopausal/atrophic genital changes. Had been on Vagifem but stopped it. Currently not sexually active. Not having significant hot flashes or night sweats. No vaginal bleeding. Prefers to stay off of Vagifem for now. Follow up if any issues or bleeding. 2. Osteoporosis. DEXA 01/2014 T score -2.8. Receiving Prolia through her primary physician's office. Continue to follow up with them in reference to this. 3. Mammography scheduled first week of August. Continue with annual mammography. SBE monthly  reviewed. 4. Colonoscopy 2015. Repeat at their recommended interval. 5. Pap smear 2013. Pap smear done today. No history of significant abnormal Pap smears. 6. Health maintenance. No routine blood work done as this is done at her primary physician's office. Follow up in one year, sooner as needed.   Anastasio Auerbach MD, 3:11 PM 12/20/2014

## 2014-12-20 NOTE — Patient Instructions (Signed)
You may obtain a copy of any labs that were done today by logging onto MyChart as outlined in the instructions provided with your AVS (after visit summary). The office will not call with normal lab results but certainly if there are any significant abnormalities then we will contact you.   Health Maintenance, Female A healthy lifestyle and preventative care can promote health and wellness.  Maintain regular health, dental, and eye exams.  Eat a healthy diet. Foods like vegetables, fruits, whole grains, low-fat dairy products, and lean protein foods contain the nutrients you need without too many calories. Decrease your intake of foods high in solid fats, added sugars, and salt. Get information about a proper diet from your caregiver, if necessary.  Regular physical exercise is one of the most important things you can do for your health. Most adults should get at least 150 minutes of moderate-intensity exercise (any activity that increases your heart rate and causes you to sweat) each week. In addition, most adults need muscle-strengthening exercises on 2 or more days a week.   Maintain a healthy weight. The body mass index (BMI) is a screening tool to identify possible weight problems. It provides an estimate of body fat based on height and weight. Your caregiver can help determine your BMI, and can help you achieve or maintain a healthy weight. For adults 20 years and older:  A BMI below 18.5 is considered underweight.  A BMI of 18.5 to 24.9 is normal.  A BMI of 25 to 29.9 is considered overweight.  A BMI of 30 and above is considered obese.  Maintain normal blood lipids and cholesterol by exercising and minimizing your intake of saturated fat. Eat a balanced diet with plenty of fruits and vegetables. Blood tests for lipids and cholesterol should begin at age 61 and be repeated every 5 years. If your lipid or cholesterol levels are high, you are over 50, or you are a high risk for heart  disease, you may need your cholesterol levels checked more frequently.Ongoing high lipid and cholesterol levels should be treated with medicines if diet and exercise are not effective.  If you smoke, find out from your caregiver how to quit. If you do not use tobacco, do not start.  Lung cancer screening is recommended for adults aged 33 80 years who are at high risk for developing lung cancer because of a history of smoking. Yearly low-dose computed tomography (CT) is recommended for people who have at least a 30-pack-year history of smoking and are a current smoker or have quit within the past 15 years. A pack year of smoking is smoking an average of 1 pack of cigarettes a day for 1 year (for example: 1 pack a day for 30 years or 2 packs a day for 15 years). Yearly screening should continue until the smoker has stopped smoking for at least 15 years. Yearly screening should also be stopped for people who develop a health problem that would prevent them from having lung cancer treatment.  If you are pregnant, do not drink alcohol. If you are breastfeeding, be very cautious about drinking alcohol. If you are not pregnant and choose to drink alcohol, do not exceed 1 drink per day. One drink is considered to be 12 ounces (355 mL) of beer, 5 ounces (148 mL) of wine, or 1.5 ounces (44 mL) of liquor.  Avoid use of street drugs. Do not share needles with anyone. Ask for help if you need support or instructions about stopping  the use of drugs.  High blood pressure causes heart disease and increases the risk of stroke. Blood pressure should be checked at least every 1 to 2 years. Ongoing high blood pressure should be treated with medicines, if weight loss and exercise are not effective.  If you are 59 to 64 years old, ask your caregiver if you should take aspirin to prevent strokes.  Diabetes screening involves taking a blood sample to check your fasting blood sugar level. This should be done once every 3  years, after age 91, if you are within normal weight and without risk factors for diabetes. Testing should be considered at a younger age or be carried out more frequently if you are overweight and have at least 1 risk factor for diabetes.  Breast cancer screening is essential preventative care for women. You should practice "breast self-awareness." This means understanding the normal appearance and feel of your breasts and may include breast self-examination. Any changes detected, no matter how small, should be reported to a caregiver. Women in their 66s and 30s should have a clinical breast exam (CBE) by a caregiver as part of a regular health exam every 1 to 3 years. After age 101, women should have a CBE every year. Starting at age 100, women should consider having a mammogram (breast X-ray) every year. Women who have a family history of breast cancer should talk to their caregiver about genetic screening. Women at a high risk of breast cancer should talk to their caregiver about having an MRI and a mammogram every year.  Breast cancer gene (BRCA)-related cancer risk assessment is recommended for women who have family members with BRCA-related cancers. BRCA-related cancers include breast, ovarian, tubal, and peritoneal cancers. Having family members with these cancers may be associated with an increased risk for harmful changes (mutations) in the breast cancer genes BRCA1 and BRCA2. Results of the assessment will determine the need for genetic counseling and BRCA1 and BRCA2 testing.  The Pap test is a screening test for cervical cancer. Women should have a Pap test starting at age 57. Between ages 25 and 35, Pap tests should be repeated every 2 years. Beginning at age 37, you should have a Pap test every 3 years as long as the past 3 Pap tests have been normal. If you had a hysterectomy for a problem that was not cancer or a condition that could lead to cancer, then you no longer need Pap tests. If you are  between ages 50 and 76, and you have had normal Pap tests going back 10 years, you no longer need Pap tests. If you have had past treatment for cervical cancer or a condition that could lead to cancer, you need Pap tests and screening for cancer for at least 20 years after your treatment. If Pap tests have been discontinued, risk factors (such as a new sexual partner) need to be reassessed to determine if screening should be resumed. Some women have medical problems that increase the chance of getting cervical cancer. In these cases, your caregiver may recommend more frequent screening and Pap tests.  The human papillomavirus (HPV) test is an additional test that may be used for cervical cancer screening. The HPV test looks for the virus that can cause the cell changes on the cervix. The cells collected during the Pap test can be tested for HPV. The HPV test could be used to screen women aged 44 years and older, and should be used in women of any age  who have unclear Pap test results. After the age of 55, women should have HPV testing at the same frequency as a Pap test.  Colorectal cancer can be detected and often prevented. Most routine colorectal cancer screening begins at the age of 44 and continues through age 20. However, your caregiver may recommend screening at an earlier age if you have risk factors for colon cancer. On a yearly basis, your caregiver may provide home test kits to check for hidden blood in the stool. Use of a small camera at the end of a tube, to directly examine the colon (sigmoidoscopy or colonoscopy), can detect the earliest forms of colorectal cancer. Talk to your caregiver about this at age 86, when routine screening begins. Direct examination of the colon should be repeated every 5 to 10 years through age 13, unless early forms of pre-cancerous polyps or small growths are found.  Hepatitis C blood testing is recommended for all people born from [birth year] through 1965 and any  individual with known risks for hepatitis C.  Practice safe sex. Use condoms and avoid high-risk sexual practices to reduce the spread of sexually transmitted infections (STIs). Sexually active women aged 36 and younger should be checked for Chlamydia, which is a common sexually transmitted infection. Older women with new or multiple partners should also be tested for Chlamydia. Testing for other STIs is recommended if you are sexually active and at increased risk.  Osteoporosis is a disease in which the bones lose minerals and strength with aging. This can result in serious bone fractures. The risk of osteoporosis can be identified using a bone density scan. Women ages 20 and over and women at risk for fractures or osteoporosis should discuss screening with their caregivers. Ask your caregiver whether you should be taking a calcium supplement or vitamin D to reduce the rate of osteoporosis.  Menopause can be associated with physical symptoms and risks. Hormone replacement therapy is available to decrease symptoms and risks. You should talk to your caregiver about whether hormone replacement therapy is right for you.  Use sunscreen. Apply sunscreen liberally and repeatedly throughout the day. You should seek shade when your shadow is shorter than you. Protect yourself by wearing long sleeves, pants, a wide-brimmed hat, and sunglasses year round, whenever you are outdoors.  Notify your caregiver of new moles or changes in moles, especially if there is a change in shape or color. Also notify your caregiver if a mole is larger than the size of a pencil eraser.  Stay current with your immunizations. Document Released: 12/09/2010 Document Revised: 09/20/2012 Document Reviewed: 12/09/2010 [facility] Patient Information 2014 [employer].

## 2014-12-20 NOTE — Addendum Note (Signed)
Addended by: Nelva Nay on: 12/20/2014 03:33 PM   Modules accepted: Orders

## 2014-12-25 LAB — CYTOLOGY - PAP

## 2015-02-14 ENCOUNTER — Encounter: Payer: Self-pay | Admitting: Gynecology

## 2015-02-22 ENCOUNTER — Encounter: Payer: Self-pay | Admitting: Gynecology

## 2015-12-21 ENCOUNTER — Encounter: Payer: Self-pay | Admitting: Gynecology

## 2015-12-21 ENCOUNTER — Ambulatory Visit (INDEPENDENT_AMBULATORY_CARE_PROVIDER_SITE_OTHER): Payer: Medicare Other | Admitting: Gynecology

## 2015-12-21 VITALS — BP 124/76 | Ht 65.0 in | Wt 124.0 lb

## 2015-12-21 DIAGNOSIS — Z01419 Encounter for gynecological examination (general) (routine) without abnormal findings: Secondary | ICD-10-CM

## 2015-12-21 DIAGNOSIS — M810 Age-related osteoporosis without current pathological fracture: Secondary | ICD-10-CM

## 2015-12-21 DIAGNOSIS — N952 Postmenopausal atrophic vaginitis: Secondary | ICD-10-CM

## 2015-12-21 NOTE — Patient Instructions (Signed)

## 2015-12-21 NOTE — Progress Notes (Signed)
    [place] Oct 30, 1950 [id number]        65 y.o.  G2P0011  for breast and pelvic exam. Several issues noted below.  Past medical history,surgical history, problem list, medications, allergies, family history and social history were all reviewed and documented as reviewed in the EPIC chart.  ROS:  Performed with pertinent positives and negatives included in the history, assessment and plan.   Additional significant findings :  None   Exam: Kelli Morales assistant Filed Vitals:   12/21/15 1432  BP: 124/76  Height: 5\' 5"  (1.651 m)  Weight: 124 lb (56.246 kg)   General appearance:  Normal affect, orientation and appearance. Skin: Grossly normal HEENT: Without gross lesions.  No cervical or supraclavicular adenopathy. Thyroid normal.  Lungs:  Clear without wheezing, rales or rhonchi Cardiac: RR, without RMG Abdominal:  Soft, nontender, without masses, guarding, rebound, organomegaly or hernia Breasts:  Examined lying and sitting without masses, retractions, discharge or axillary adenopathy. Pelvic:  Ext/BUS/Vagina with atrophic changes  Cervix with atrophic changes  Uterus anteverted, normal size, shape and contour, midline and mobile nontender   Adnexa without masses or tenderness    Anus and perineum normal   Rectovaginal normal sphincter tone without palpated masses or tenderness.    Assessment/Plan:  65 y.o. G1P0011 female for breast and pelvic exam.   1. Postmenopausal/atrophic genital changes. Not having significant issues with hot flushes, night sweats, vaginal dryness or any vaginal bleeding. Currently not sexually active. Follow up if any issues or vaginal bleeding. 2. Osteoporosis. DEXA 01/2014 T score -2.8. Going into second-year Prolia through her primary physician's office. Is planning DEXA this fall. Continue to follow up with her primary physician for bone health. 3. Mammography 02/2015. Continue with annual mammography when due. SBE monthly  reviewed. 4. Colonoscopy 2015. Repeat at their recommended interval. 5. Pap smear 12/2014. No Pap smear done today. No history of significant abnormal Pap smears. Plan repeat Pap smear at 3 year interval per current screening guidelines. 6. Health maintenance. No routine lab work done as patient reports this done elsewhere. Follow up 1 year, sooner as needed.   Anastasio Auerbach MD, 2:48 PM 12/21/2015

## 2016-04-09 ENCOUNTER — Encounter: Payer: Self-pay | Admitting: Gynecology

## 2016-09-22 ENCOUNTER — Encounter: Payer: Self-pay | Admitting: Neurology

## 2016-10-15 ENCOUNTER — Encounter (INDEPENDENT_AMBULATORY_CARE_PROVIDER_SITE_OTHER): Payer: Self-pay

## 2016-10-15 ENCOUNTER — Encounter: Payer: Self-pay | Admitting: Neurology

## 2016-10-15 ENCOUNTER — Ambulatory Visit (INDEPENDENT_AMBULATORY_CARE_PROVIDER_SITE_OTHER): Payer: Medicare Other | Admitting: Neurology

## 2016-10-15 VITALS — BP 112/70 | HR 64 | Ht 65.0 in | Wt 141.0 lb

## 2016-10-15 DIAGNOSIS — G253 Myoclonus: Secondary | ICD-10-CM | POA: Insufficient documentation
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
## 2016-10-15 NOTE — Patient Instructions (Signed)
   Stop the gabapentin and allow for adequate rest. Call if the symptoms persist or worsen.

## 2016-10-16 ENCOUNTER — Telehealth: Payer: Self-pay | Admitting: *Deleted

## 2016-10-16 ENCOUNTER — Ambulatory Visit: Payer: Medicare Other | Admitting: Neurology

## 2016-10-16 LAB — AMMONIA: Ammonia: 24 ug/dL (ref 19–87)

## 2016-10-16 NOTE — Telephone Encounter (Signed)
Called and spoke with pt about unremarkable labs per CW,MD note. Patient verbalized understanding.  

## 2016-10-16 NOTE — Telephone Encounter (Signed)
-----   Message from Kathrynn Ducking, MD sent at 10/16/2016  2:54 PM EDT -----  The blood work results are unremarkable. Please call the patient.  ----- Message ----- From: Lavone Neri Lab Results In Sent: 10/16/2016   1:38 PM To: Kathrynn Ducking, MD

## 2016-11-11 ENCOUNTER — Encounter: Payer: Self-pay | Admitting: Gynecology

## 2016-11-12 ENCOUNTER — Encounter: Payer: Self-pay | Admitting: Gynecology

## 2016-11-13 ENCOUNTER — Encounter: Payer: Self-pay | Admitting: Gynecology

## 2017-02-25 ENCOUNTER — Encounter: Payer: Self-pay | Admitting: Gynecology

## 2017-02-25 ENCOUNTER — Ambulatory Visit (INDEPENDENT_AMBULATORY_CARE_PROVIDER_SITE_OTHER): Payer: Medicare Other | Admitting: Gynecology

## 2017-02-25 VITALS — BP 120/78 | Ht 65.0 in | Wt 144.0 lb

## 2017-02-25 DIAGNOSIS — M810 Age-related osteoporosis without current pathological fracture: Secondary | ICD-10-CM | POA: Diagnosis not present

## 2017-02-25 DIAGNOSIS — N952 Postmenopausal atrophic vaginitis: Secondary | ICD-10-CM

## 2017-02-25 DIAGNOSIS — Z01411 Encounter for gynecological examination (general) (routine) with abnormal findings: Secondary | ICD-10-CM | POA: Diagnosis not present

## 2017-02-25 NOTE — Progress Notes (Signed)
    [place] 10-11-1950 [id number]        66 y.o.  G2P0011 for annual gynecologic exam.    Past medical history,surgical history, problem list, medications, allergies, family history and social history were all reviewed and documented as reviewed in the EPIC chart.  ROS:  Performed with pertinent positives and negatives included in the history, assessment and plan.   Additional significant findings :  None   Exam: [name] assistant Vitals:   02/25/17 1558  BP: 120/78  Weight: 144 lb (65.3 kg)  Height: 5\' 5"  (1.651 m)   Body mass index is 23.96 kg/m.  General appearance:  Normal affect, orientation and appearance. Skin: Grossly normal HEENT: Without gross lesions.  No cervical or supraclavicular adenopathy. Thyroid normal.  Lungs:  Clear without wheezing, rales or rhonchi Cardiac: RR, without RMG Abdominal:  Soft, nontender, without masses, guarding, rebound, organomegaly or hernia Breasts:  Examined lying and sitting without masses, retractions, discharge or axillary adenopathy. Pelvic:  Ext, BUS, Vagina: With atrophic changes  Cervix: With atrophic changes  Uterus: Anteverted, normal size, shape and contour, midline and mobile nontender   Adnexa: Without masses or tenderness    Anus and perineum: Normal   Rectovaginal: Normal sphincter tone without palpated masses or tenderness.    Assessment/Plan:  66 y.o. G60P0011 female for annual gynecologic exam.   1. Postmenopausal/atrophic genital changes. No significant hot flushes, night sweats, vaginal dryness or any vaginal bleeding. Continue to monitor report any issues or bleeding. 2. Osteoporosis. Followed by her primary physician on Prolia. We'll continue to follow up with them for bone management.  Most recent DEXA DEXA 2018 which showed improvement from her prior study. 3. Mammography coming due in November and she will follow up for this. Breast exam normal today. 4. Colonoscopy 2015. Repeat at their recommended  interval. 5. Pap smear 12/2014. No Pap smear done today. No history of abnormal Pap smears previously. Plan repeat Pap smear next year at 3 year interval. 6. Health maintenance. No routine lab work done as patient does this elsewhere. Follow up 1 year, sooner as needed.   Anastasio Auerbach MD, 4:17 PM 02/25/2017

## 2017-02-25 NOTE — Patient Instructions (Signed)
Followup in one year for annual exam, sooner as needed. 

## 2017-05-21 ENCOUNTER — Encounter: Payer: Self-pay | Admitting: Gynecology

## 2018-03-12 ENCOUNTER — Other Ambulatory Visit: Payer: Self-pay

## 2018-03-29 ENCOUNTER — Encounter: Payer: Self-pay | Admitting: Psychiatry

## 2018-04-12 ENCOUNTER — Other Ambulatory Visit: Payer: Self-pay

## 2018-04-12 MED ORDER — LAMOTRIGINE 100 MG PO TABS: ORAL_TABLET | ORAL | 0 refills | Status: DC

## 2018-04-15 ENCOUNTER — Encounter: Payer: Self-pay | Admitting: Psychiatry

## 2018-04-15 ENCOUNTER — Encounter (INDEPENDENT_AMBULATORY_CARE_PROVIDER_SITE_OTHER): Payer: Self-pay

## 2018-04-15 ENCOUNTER — Ambulatory Visit: Payer: Medicare Other | Admitting: Psychiatry

## 2018-04-15 DIAGNOSIS — F3181 Bipolar II disorder: Secondary | ICD-10-CM | POA: Diagnosis not present

## 2018-04-15 DIAGNOSIS — F411 Generalized anxiety disorder: Secondary | ICD-10-CM | POA: Diagnosis not present

## 2018-04-15 DIAGNOSIS — G43009 Migraine without aura, not intractable, without status migrainosus: Secondary | ICD-10-CM

## 2018-04-15 MED ORDER — CLONAZEPAM 0.5 MG PO TABS: 0.5000 mg | ORAL_TABLET | Freq: Three times a day (TID) | ORAL | 0 refills | Status: DC | PRN

## 2018-04-15 NOTE — Progress Notes (Signed)
Kelli Morales [id number] 1951-05-11 67 y.o.  Subjective:   Patient ID:  Kelli Morales Cham is a 67 y.o. (DOB 05-19-1951) female.  Chief Complaint:  Chief Complaint  Patient presents with  . Anxiety  . Depression  . Medication Problem  . Stress    HPI Kelli Morales presents to the office today for follow-up of bipolar disorder an insomnia chronically.   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  Cycles from hyper to depressed under stress it's worse.  Fighting with [employer] over settlement.  Get hyper if late to somewhere.  Stressed out.  Can't move forward.  Sleep good with trazadone 300 and clonazepam.  Wants more clonazepam bc needs a dose in the day.  Disc poss of mood stabilizer but doesn't want one with wt gain risks.  Pt reports that mood is Anxious, Depressed and stressed. and describes anxiety as Moderate. Anxiety symptoms include: Excessive Worry,. Pt reports no sleep issues. Pt reports that appetite is good. Pt reports that energy is good and good. Concentration is down slightly. Suicidal thoughts:  denied by patient.   Review of Systems:  Review of Systems  Musculoskeletal: Positive for arthralgias.  Neurological: Positive for headaches. Negative for tremors and weakness.  Psychiatric/Behavioral: Positive for dysphoric mood. Negative for agitation, behavioral problems, confusion, decreased concentration, hallucinations, self-injury, sleep disturbance and suicidal ideas. The patient is nervous/anxious and is hyperactive.     Medications: I have reviewed the patient's current medications.  Current Outpatient Medications  Medication Sig Dispense Refill  . Albuterol Sulfate (VENTOLIN HFA IN) Inhale 1 puff into the lungs every 6 (six) hours as needed. wheezing    . aspirin 81 MG tablet Take 325 mg by mouth at bedtime.     . cholecalciferol (VITAMIN D) 1000 UNITS tablet Take 1,000 Units by mouth daily.    . clonazePAM (KLONOPIN) 0.5 MG tablet Take 1  tablet (0.5 mg total) by mouth 3 (three) times daily as needed for anxiety (and insomnia). 270 tablet 0  . fish oil-omega-3 fatty acids 1000 MG capsule Take 1 g by mouth daily.    . fluticasone (FLONASE) 50 MCG/ACT nasal spray Place 2 sprays into the nose daily.    . Fluticasone Furoate (ARNUITY ELLIPTA IN) Inhale into the lungs.    . lamoTRIgine (LAMICTAL) 100 MG tablet Take 1 tablet in the morning and 2 tablets at bedtime. 270 tablet 0  . Multiple Vitamin (MULTIVITAMIN WITH MINERALS) TABS Take 1 tablet by mouth daily.    Marland Kitchen olmesartan (BENICAR) 40 MG tablet Take 40 mg by mouth daily.    . pantoprazole (PROTONIX) 40 MG tablet Take 40 mg by mouth daily.    . promethazine (PHENERGAN) 25 MG tablet Take 25 mg by mouth every 6 (six) hours as needed. Nausea    . topiramate (TOPAMAX) 200 MG tablet Take 200 mg by mouth 2 (two) times daily.     . traZODone (DESYREL) 100 MG tablet Take 300 mg by mouth at bedtime. 3 tabs    . zonisamide (ZONEGRAN) 100 MG capsule Take 300 mg by mouth daily.     . cetirizine (ZYRTEC) 10 MG tablet Take 10 mg by mouth daily.    Marland Kitchen denosumab (PROLIA) 60 MG/ML SOLN injection Inject 60 mg into the skin every 6 (six) months. Administer in upper arm, thigh, or abdomen    . Multiple Vitamins-Minerals (ICAPS AREDS FORMULA PO) Take 1 tablet by mouth 2 (two) times daily.  No current facility-administered medications for this visit.     Medication Side Effects: None  Allergies:  Allergies  Allergen Reactions  . Divalproex Sodium     Became comatose when she took Depakote, Seroquel, and Geodon together.  Lindajo Royal [Ziprasidone Hydrochloride]     Became comatose when she took Depakote, Seroquel, and Geodon together.  Marland Kitchen Seroquel [Quetiapine Fumerate]     Became comatose when she took Depakote, Seroquel, and Geodon together.    Past Medical History:  Diagnosis Date  . Anxiety   . Asthma   . Bipolar disorder ([place])   . Cataracts, bilateral   . Depression   . GERD  (gastroesophageal reflux disease)   . Hydatid cyst    Left  . Hypertension   . Kidney problem    RT kidney is smaller -> decreased output  . Migraine   . Osteoporosis 11/2016   T score -2.8  improved from prior DEXA  . Right rotator cuff tear     Family History  Problem Relation Age of Onset  . Diabetes Father   . Hypertension Father   . Heart disease Father   . Stroke Father   . Cancer Father        Lymphoma  . Hypertension Mother   . Heart disease Mother   . Stroke Mother   . Stroke Paternal Uncle     Social History   Socioeconomic History  . Marital status: Divorced    Spouse name: Not on file  . Number of children: 1  . Years of education: MSN  . Highest education level: Not on file  Occupational History  . Not on file  Social Needs  . Financial resource strain: Not on file  . Food insecurity:    Worry: Not on file    Inability: Not on file  . Transportation needs:    Medical: Not on file    Non-medical: Not on file  Tobacco Use  . Smoking status: Never Smoker  . Smokeless tobacco: Never Used  Substance and Sexual Activity  . Alcohol use: Yes    Alcohol/week: 0.0 standard drinks    Comment: Rare  . Drug use: No  . Sexual activity: Not Currently    Birth control/protection: Post-menopausal    Comment: 1st intercourse 67 yo-2 partners  Lifestyle  . Physical activity:    Days per week: Not on file    Minutes per session: Not on file  . Stress: Not on file  Relationships  . Social connections:    Talks on phone: Not on file    Gets together: Not on file    Attends religious service: Not on file    Active member of club or organization: Not on file    Attends meetings of clubs or organizations: Not on file    Relationship status: Not on file  . Intimate partner violence:    Fear of current or ex partner: Not on file    Emotionally abused: Not on file    Physically abused: Not on file    Forced sexual activity: Not on file  Other Topics Concern   . Not on file  Social History Narrative   Lives with husband and daughter   Caffeine use: 2 cups per day   Right handed    Past Medical History, Surgical history, Social history, and Family history were reviewed and updated as appropriate.   Please see review of systems for further details on the patient's review from today.  Objective:   Physical Exam:  There were no vitals taken for this visit.  Physical Exam  Constitutional: She is oriented to person, place, and time. She appears well-developed. No distress.  Musculoskeletal: She exhibits no deformity.  Neurological: She is alert and oriented to person, place, and time. She displays no tremor. Coordination and gait normal.  Psychiatric: Her speech is normal and behavior is normal. Judgment and thought content normal. Her mood appears anxious. Her affect is angry. Her affect is not blunt, not labile and not inappropriate. Thought content is not paranoid. Cognition and memory are normal. She exhibits a depressed mood. She expresses no homicidal and no suicidal ideation. She expresses no suicidal plans and no homicidal plans.  Insight intact. No auditory or visual hallucinations. No delusions.  She is attentive.    Lab Review:     Component Value Date/Time   NA 146 (H) 04/12/2009 1050   K 3.1 (L) 04/12/2009 1050   CL 110 04/12/2009 1050   CO2 30 04/12/2009 1050   GLUCOSE 99 04/12/2009 1050   BUN 20 04/12/2009 1050   CREATININE 1.36 (H) 08/26/2012 1636   CALCIUM 9.8 04/12/2009 1050   PROT 7.2 04/12/2009 1132   ALBUMIN 4.4 04/12/2009 1132   AST 30 04/12/2009 1132   ALT 68 (H) 04/12/2009 1132   ALKPHOS 58 04/12/2009 1132   BILITOT 0.7 04/12/2009 1132   GFRNONAA 41 (L) 08/26/2012 1636   GFRAA 48 (L) 08/26/2012 1636       Component Value Date/Time   WBC 3.2 (L) 08/26/2012 1636   RBC 3.95 08/26/2012 1636   HGB 11.6 (L) 08/26/2012 1636   HCT 36.5 08/26/2012 1636   PLT 123 (L) 08/26/2012 1636   MCV 92.4 08/26/2012 1636    MCH 29.4 08/26/2012 1636   MCHC 31.8 08/26/2012 1636   RDW 12.1 08/26/2012 1636    No results found for: POCLITH, LITHIUM   No results found for: PHENYTOIN, PHENOBARB, VALPROATE, CBMZ   .res Assessment: Plan:    Mixed bipolar II disorder with rapid cycling ([place]) - Plan: clonazePAM (KLONOPIN) 0.5 MG tablet  Generalized anxiety disorder - Plan: clonazePAM (KLONOPIN) 0.5 MG tablet  Migraine without aura and without status migrainosus, not intractable  Please see After Visit Summary for patient specific instructions.  Greater than 50% of face to face time with patient was spent on counseling and coordination of care. We discussed unstable bipolar with mixed symptoms and how she is not on traditionally potent mood stabilizer.  She is very sensitive to some mood stabilizers and is very sensitive about her weight and anything that might cause weight gain.  She took Latuda 40 mg for a couple of weeks several years ago and gained 5 pounds and refused to take it again.  The only mood stabilizer left with very low weight gain risk is Dietitian.  We discussed side effects of Vraylar.  She does agrees to take 1.5 mg a day.  Gave her 4 weeks of samples  Discussed potential metabolic side effects associated with atypical antipsychotics, as well as potential risk for movement side effects. Advised pt to contact office if movement side effects occur.   We discussed the short-term risks associated with benzodiazepines including sedation and increased fall risk among others.  Discussed long-term side effect risk including dependence, potential withdrawal symptoms, and the potential eventual dose-related risk of dementia.  Ok increase clonazepam, since not a high dosage.  Hope to use it extra just temporarily.  Send in prescription for  clonazepam 0.5 mg 3 times daily as needed anxiety or insomnia.  Discussed stress relief techniques.  She will use exercise.  This appointment was 35 minutes  Follow-up 6  weeks  Lynder Parents MD, DFAPA   Future Appointments  Date Time Provider [place]  04/29/2018  3:30 PM Fontaine, Belinda Block, MD [facility]    No orders of the defined types were placed in this encounter.     -------------------------------

## 2018-04-18 ENCOUNTER — Other Ambulatory Visit: Payer: Self-pay | Admitting: Psychiatry

## 2018-04-29 ENCOUNTER — Encounter: Payer: Self-pay | Admitting: Gynecology

## 2018-04-29 ENCOUNTER — Ambulatory Visit: Payer: Medicare Other | Admitting: Gynecology

## 2018-04-29 VITALS — BP 118/76 | Ht 65.0 in | Wt 153.0 lb

## 2018-04-29 DIAGNOSIS — Z01419 Encounter for gynecological examination (general) (routine) without abnormal findings: Secondary | ICD-10-CM

## 2018-04-29 DIAGNOSIS — N952 Postmenopausal atrophic vaginitis: Secondary | ICD-10-CM

## 2018-04-29 DIAGNOSIS — M810 Age-related osteoporosis without current pathological fracture: Secondary | ICD-10-CM

## 2018-04-29 NOTE — Progress Notes (Signed)
    Kelli Morales 22-Oct-1950 [id number]        67 y.o.  G2P0011 for annual gynecologic exam.  Doing well without gynecologic complaints  Past medical history,surgical history, problem list, medications, allergies, family history and social history were all reviewed and documented as reviewed in the EPIC chart.  ROS:  Performed with pertinent positives and negatives included in the history, assessment and plan.   Additional significant findings : None   Exam: [name] assistant Vitals:   04/29/18 1523  BP: 118/76  Weight: 153 lb (69.4 kg)  Height: 5\' 5"  (1.651 m)   Body mass index is 25.46 kg/m.  General appearance:  Normal affect, orientation and appearance. Skin: Grossly normal HEENT: Without gross lesions.  No cervical or supraclavicular adenopathy. Thyroid normal.  Lungs:  Clear without wheezing, rales or rhonchi Cardiac: RR, without RMG Abdominal:  Soft, nontender, without masses, guarding, rebound, organomegaly or hernia Breasts:  Examined lying and sitting without masses, retractions, discharge or axillary adenopathy. Pelvic:  Ext, BUS, Vagina: With atrophic changes  Cervix: With atrophic changes.  Pap smear done  Uterus: Anteverted, normal size, shape and contour, midline and mobile nontender   Adnexa: Without masses or tenderness    Anus and perineum: Normal   Rectovaginal: Normal sphincter tone without palpated masses or tenderness.    Assessment/Plan:  67 y.o. G38P0011 female for annual gynecologic exam.   1. Postmenopausal.  No significant menopausal symptoms or any vaginal bleeding. 2. Osteoporosis being followed by her primary physician.  On Prolia.  Continue to follow-up with them in reference to this.  Last DEXA 2018 which showed improvement. 3. Mammography coming due in December and I reminded her to schedule this.  Breast exam normal today. 4. Colonoscopy 2015.  Repeat at their recommended interval. 5. Pap smear 2016.  Pap smear done today.  No history  of abnormal Pap smears previously 6. Health maintenance.  No routine lab work done as patient does this elsewhere.  Follow-up 1 year, sooner as needed.   Anastasio Auerbach MD, 3:49 PM 04/29/2018

## 2018-04-29 NOTE — Addendum Note (Signed)
Addended by: Nelva Nay on: 04/29/2018 04:25 PM   Modules accepted: Orders

## 2018-04-29 NOTE — Patient Instructions (Signed)
Follow-up in 1 year for annual exam 

## 2018-04-30 LAB — PAP IG W/ RFLX HPV ASCU

## 2018-05-20 ENCOUNTER — Telehealth: Payer: Self-pay | Admitting: Psychiatry

## 2018-05-20 NOTE — Telephone Encounter (Signed)
Patient was prescribed Vraylar. Stated she has gained 8lbs while taking this med. Would like to discontinue taking. Please find a alternative for me to take please.

## 2018-05-21 NOTE — Telephone Encounter (Signed)
Needs an appt to change the med.  She can DC the Vraylar now if she wants but risks some mood swings off of it.  Seems wise to me, to wait until after Christmas to change the med but she can decide.

## 2018-05-21 NOTE — Telephone Encounter (Signed)
Please respond

## 2018-05-24 NOTE — Telephone Encounter (Signed)
Left voice mail

## 2018-05-24 NOTE — Telephone Encounter (Signed)
Given information and she will do what's recommended.

## 2018-06-26 ENCOUNTER — Other Ambulatory Visit: Payer: Self-pay | Admitting: Psychiatry

## 2018-07-12 ENCOUNTER — Emergency Department (HOSPITAL_COMMUNITY): Payer: Medicare Other

## 2018-07-12 ENCOUNTER — Encounter (HOSPITAL_COMMUNITY): Payer: Self-pay | Admitting: *Deleted

## 2018-07-12 ENCOUNTER — Inpatient Hospital Stay (HOSPITAL_COMMUNITY): Payer: Medicare Other

## 2018-07-12 ENCOUNTER — Inpatient Hospital Stay (HOSPITAL_COMMUNITY)
Admission: EM | Admit: 2018-07-12 | Discharge: 2018-07-15 | DRG: 478 | Disposition: A | Discharge status: Home / Self Care | Payer: Medicare Other | Attending: Internal Medicine | Admitting: Internal Medicine

## 2018-07-12 DIAGNOSIS — S22089A Unspecified fracture of T11-T12 vertebra, initial encounter for closed fracture: Principal | ICD-10-CM | POA: Diagnosis present

## 2018-07-12 DIAGNOSIS — D649 Anemia, unspecified: Secondary | ICD-10-CM | POA: Diagnosis present

## 2018-07-12 DIAGNOSIS — I129 Hypertensive chronic kidney disease with stage 1 through stage 4 chronic kidney disease, or unspecified chronic kidney disease: Secondary | ICD-10-CM | POA: Diagnosis present

## 2018-07-12 DIAGNOSIS — F319 Bipolar disorder, unspecified: Secondary | ICD-10-CM | POA: Diagnosis present

## 2018-07-12 DIAGNOSIS — I959 Hypotension, unspecified: Secondary | ICD-10-CM | POA: Diagnosis present

## 2018-07-12 DIAGNOSIS — R55 Syncope and collapse: Secondary | ICD-10-CM | POA: Diagnosis present

## 2018-07-12 DIAGNOSIS — W1839XA Other fall on same level, initial encounter: Secondary | ICD-10-CM | POA: Diagnosis present

## 2018-07-12 DIAGNOSIS — Z9181 History of falling: Secondary | ICD-10-CM

## 2018-07-12 DIAGNOSIS — M810 Age-related osteoporosis without current pathological fracture: Secondary | ICD-10-CM | POA: Diagnosis present

## 2018-07-12 DIAGNOSIS — G8929 Other chronic pain: Secondary | ICD-10-CM | POA: Diagnosis present

## 2018-07-12 DIAGNOSIS — N183 Chronic kidney disease, stage 3 (moderate): Secondary | ICD-10-CM | POA: Diagnosis present

## 2018-07-12 DIAGNOSIS — Y92000 Kitchen of unspecified non-institutional (private) residence as  the place of occurrence of the external cause: Secondary | ICD-10-CM | POA: Diagnosis not present

## 2018-07-12 DIAGNOSIS — M47817 Spondylosis without myelopathy or radiculopathy, lumbosacral region: Secondary | ICD-10-CM | POA: Diagnosis present

## 2018-07-12 DIAGNOSIS — F419 Anxiety disorder, unspecified: Secondary | ICD-10-CM | POA: Diagnosis present

## 2018-07-12 DIAGNOSIS — M4846XA Fatigue fracture of vertebra, lumbar region, initial encounter for fracture: Secondary | ICD-10-CM

## 2018-07-12 DIAGNOSIS — D696 Thrombocytopenia, unspecified: Secondary | ICD-10-CM | POA: Diagnosis present

## 2018-07-12 DIAGNOSIS — M4846XD Fatigue fracture of vertebra, lumbar region, subsequent encounter for fracture with routine healing: Secondary | ICD-10-CM | POA: Diagnosis not present

## 2018-07-12 DIAGNOSIS — Z66 Do not resuscitate: Secondary | ICD-10-CM | POA: Diagnosis present

## 2018-07-12 DIAGNOSIS — G43909 Migraine, unspecified, not intractable, without status migrainosus: Secondary | ICD-10-CM | POA: Diagnosis present

## 2018-07-12 DIAGNOSIS — E876 Hypokalemia: Secondary | ICD-10-CM | POA: Diagnosis present

## 2018-07-12 DIAGNOSIS — M5136 Other intervertebral disc degeneration, lumbar region: Secondary | ICD-10-CM | POA: Diagnosis present

## 2018-07-12 DIAGNOSIS — E860 Dehydration: Secondary | ICD-10-CM | POA: Diagnosis present

## 2018-07-12 DIAGNOSIS — W19XXXA Unspecified fall, initial encounter: Secondary | ICD-10-CM | POA: Diagnosis not present

## 2018-07-12 DIAGNOSIS — S32010A Wedge compression fracture of first lumbar vertebra, initial encounter for closed fracture: Secondary | ICD-10-CM

## 2018-07-12 DIAGNOSIS — N179 Acute kidney failure, unspecified: Secondary | ICD-10-CM | POA: Diagnosis present

## 2018-07-12 DIAGNOSIS — J45909 Unspecified asthma, uncomplicated: Secondary | ICD-10-CM | POA: Diagnosis present

## 2018-07-12 DIAGNOSIS — S32019A Unspecified fracture of first lumbar vertebra, initial encounter for closed fracture: Secondary | ICD-10-CM | POA: Diagnosis present

## 2018-07-12 DIAGNOSIS — Z8249 Family history of ischemic heart disease and other diseases of the circulatory system: Secondary | ICD-10-CM | POA: Diagnosis not present

## 2018-07-12 DIAGNOSIS — W19XXXD Unspecified fall, subsequent encounter: Secondary | ICD-10-CM | POA: Diagnosis not present

## 2018-07-12 DIAGNOSIS — S22080A Wedge compression fracture of T11-T12 vertebra, initial encounter for closed fracture: Secondary | ICD-10-CM

## 2018-07-12 LAB — BASIC METABOLIC PANEL
Anion gap: 5 (ref 5–15)
BUN: 29 mg/dL — AB (ref 8–23)
CO2: 22 mmol/L (ref 22–32)
Calcium: 9.6 mg/dL (ref 8.9–10.3)
Chloride: 113 mmol/L — ABNORMAL HIGH (ref 98–111)
Creatinine, Ser: 1.63 mg/dL — ABNORMAL HIGH (ref 0.44–1.00)
GFR calc Af Amer: 37 mL/min — ABNORMAL LOW (ref >60)
GFR calc non Af Amer: 32 mL/min — ABNORMAL LOW (ref >60)
Glucose, Bld: 132 mg/dL — ABNORMAL HIGH (ref 70–99)
Potassium: 4.2 mmol/L (ref 3.5–5.1)
Sodium: 140 mmol/L (ref 135–145)

## 2018-07-12 LAB — CBC
HCT: 40.2 % (ref 36.0–46.0)
HEMOGLOBIN: 12.1 g/dL (ref 12.0–15.0)
MCH: 29.4 pg (ref 26.0–34.0)
MCHC: 30.1 g/dL (ref 30.0–36.0)
MCV: 97.6 fL (ref 80.0–100.0)
Platelets: 166 10*3/uL (ref 150–400)
RBC: 4.12 MIL/uL (ref 3.87–5.11)
RDW: 11.9 % (ref 11.5–15.5)
WBC: 9.6 10*3/uL (ref 4.0–10.5)
nRBC: 0 % (ref 0.0–0.2)

## 2018-07-12 LAB — URINALYSIS, ROUTINE W REFLEX MICROSCOPIC
Bilirubin Urine: NEGATIVE
Glucose, UA: NEGATIVE mg/dL
Hgb urine dipstick: NEGATIVE
Ketones, ur: NEGATIVE mg/dL
Leukocytes, UA: NEGATIVE
Nitrite: NEGATIVE
Protein, ur: NEGATIVE mg/dL
SPECIFIC GRAVITY, URINE: 1.02 (ref 1.005–1.030)
pH: 6 (ref 5.0–8.0)

## 2018-07-12 LAB — TROPONIN I: Troponin I: <0.03 ng/mL (ref <0.03)

## 2018-07-12 LAB — CBG MONITORING, ED: GLUCOSE-CAPILLARY: 111 mg/dL — AB (ref 70–99)

## 2018-07-12 MED ORDER — LAMOTRIGINE 100 MG PO TABS: 200.0000 mg | ORAL_TABLET | Freq: Every day | ORAL | Status: DC

## 2018-07-12 MED ORDER — ZONISAMIDE 100 MG PO CAPS
300.0000 mg | ORAL_CAPSULE | Freq: Every day | ORAL | Status: DC
  Administered 2018-07-12 – 2018-07-14 (×3): 300 mg via ORAL
  Filled 2018-07-12 (×4): qty 3

## 2018-07-12 MED ORDER — OXYCODONE-ACETAMINOPHEN 5-325 MG PO TABS
2.0000 | ORAL_TABLET | ORAL | Status: DC | PRN
  Administered 2018-07-13 – 2018-07-15 (×6): 2 via ORAL
  Filled 2018-07-12 (×6): qty 2

## 2018-07-12 MED ORDER — CLONAZEPAM 0.5 MG PO TABS
0.5000 mg | ORAL_TABLET | Freq: Three times a day (TID) | ORAL | Status: DC | PRN
  Administered 2018-07-12 – 2018-07-14 (×4): 0.5 mg via ORAL
  Filled 2018-07-12 (×4): qty 1

## 2018-07-12 MED ORDER — TOPIRAMATE 100 MG PO TABS
200.0000 mg | ORAL_TABLET | Freq: Two times a day (BID) | ORAL | Status: DC
  Administered 2018-07-12 – 2018-07-15 (×6): 200 mg via ORAL
  Filled 2018-07-12 (×6): qty 2

## 2018-07-12 MED ORDER — TRAZODONE HCL 100 MG PO TABS
300.0000 mg | ORAL_TABLET | Freq: Every day | ORAL | Status: DC
  Administered 2018-07-12 – 2018-07-14 (×3): 300 mg via ORAL
  Filled 2018-07-12 (×3): qty 3

## 2018-07-12 MED ORDER — BUDESONIDE 0.5 MG/2ML IN SUSP
0.5000 mg | Freq: Two times a day (BID) | RESPIRATORY_TRACT | Status: DC
  Administered 2018-07-12 – 2018-07-15 (×6): 0.5 mg via RESPIRATORY_TRACT
  Filled 2018-07-12 (×6): qty 2

## 2018-07-12 MED ORDER — SODIUM CHLORIDE 0.9 % IV BOLUS
1000.0000 mL | Freq: Once | INTRAVENOUS | Status: AC
  Administered 2018-07-12: 1000 mL via INTRAVENOUS

## 2018-07-12 MED ORDER — HYDROMORPHONE HCL 1 MG/ML IJ SOLN
0.5000 mg | Freq: Once | INTRAMUSCULAR | Status: AC
  Administered 2018-07-12: 0.5 mg via INTRAVENOUS
  Filled 2018-07-12: qty 1

## 2018-07-12 MED ORDER — HYDROMORPHONE HCL 1 MG/ML IJ SOLN
1.0000 mg | INTRAMUSCULAR | Status: DC | PRN
  Administered 2018-07-12 – 2018-07-13 (×4): 1 mg via INTRAVENOUS
  Filled 2018-07-12 (×4): qty 1

## 2018-07-12 MED ORDER — HYDROCODONE-ACETAMINOPHEN 5-325 MG PO TABS: 1.0000 | ORAL_TABLET | Freq: Four times a day (QID) | ORAL | 0 refills | Status: DC | PRN

## 2018-07-12 MED ORDER — HYDROMORPHONE HCL 1 MG/ML IJ SOLN: 0.5000 mg | INTRAMUSCULAR | Status: DC | PRN

## 2018-07-12 MED ORDER — ASPIRIN EC 325 MG PO TBEC
325.0000 mg | DELAYED_RELEASE_TABLET | ORAL | Status: DC
  Administered 2018-07-12: 325 mg via ORAL

## 2018-07-12 MED ORDER — POLYVINYL ALCOHOL 1.4 % OP SOLN
1.0000 [drp] | Freq: Every evening | OPHTHALMIC | Status: DC | PRN
  Administered 2018-07-14: 1 [drp] via OPHTHALMIC
  Filled 2018-07-12: qty 15

## 2018-07-12 MED ORDER — FERROUS SULFATE 325 (65 FE) MG PO TABS
ORAL_TABLET | Freq: Every day | ORAL | Status: DC
  Administered 2018-07-13 – 2018-07-15 (×3): 325 mg via ORAL
  Filled 2018-07-12 (×3): qty 1

## 2018-07-12 MED ORDER — LAMOTRIGINE 100 MG PO TABS
200.0000 mg | ORAL_TABLET | Freq: Every day | ORAL | Status: DC
  Administered 2018-07-12 – 2018-07-14 (×3): 200 mg via ORAL
  Filled 2018-07-12 (×3): qty 2

## 2018-07-12 MED ORDER — SODIUM CHLORIDE 0.9 % IV SOLN
INTRAVENOUS | Status: DC
  Administered 2018-07-12 – 2018-07-13 (×2): via INTRAVENOUS

## 2018-07-12 MED ORDER — FLUTICASONE FUROATE 200 MCG/ACT IN AEPB: INHALATION_SPRAY | Freq: Every day | RESPIRATORY_TRACT | Status: DC

## 2018-07-12 MED ORDER — LAMOTRIGINE 100 MG PO TABS
100.0000 mg | ORAL_TABLET | Freq: Every day | ORAL | Status: DC
  Administered 2018-07-13 – 2018-07-15 (×3): 100 mg via ORAL
  Filled 2018-07-12 (×4): qty 1

## 2018-07-12 MED ORDER — PANTOPRAZOLE SODIUM 40 MG PO TBEC
40.0000 mg | DELAYED_RELEASE_TABLET | Freq: Every day | ORAL | Status: DC
  Administered 2018-07-12 – 2018-07-15 (×4): 40 mg via ORAL
  Filled 2018-07-12 (×4): qty 1

## 2018-07-12 MED ORDER — TIZANIDINE HCL 4 MG PO TABS: 2.0000 mg | ORAL_TABLET | ORAL | Status: DC | PRN

## 2018-07-12 MED ORDER — ENOXAPARIN SODIUM 40 MG/0.4ML ~~LOC~~ SOLN
40.0000 mg | SUBCUTANEOUS | Status: DC
  Administered 2018-07-12: 40 mg via SUBCUTANEOUS
  Filled 2018-07-12: qty 0.4

## 2018-07-12 MED ORDER — GLYCERIN-HYPROMELLOSE-PEG 400 0.2-0.2-1 % OP SOLN: 1.0000 [drp] | Freq: Every evening | OPHTHALMIC | Status: DC | PRN

## 2018-07-12 NOTE — ED Notes (Signed)
Pt not able to urinate.  Will try again in 30 minutes.

## 2018-07-12 NOTE — ED Notes (Signed)
Patient in CT

## 2018-07-12 NOTE — ED Notes (Signed)
ED TO INPATIENT HANDOFF REPORT  Name/Age/Gender Kelli Morales 68 y.o. female  Code Status   Home/SNF/Other Home  Chief Complaint fall/back pain  Level of Care/Admitting Diagnosis ED Disposition    ED Disposition Condition [facility]: Center [100102]  Level of Care: Telemetry [5]  Admit to tele based on following criteria: Complex arrhythmia (Bradycardia/Tachycardia)  Admit to tele based on following criteria: Eval of Syncope  Diagnosis: Fall [290176]  Admitting Physician: Nita Sells [phone]  Attending Physician: Nita Sells [phone]  Estimated length of stay: past midnight tomorrow  Certification:: I certify this patient will need inpatient services for at least 2 midnights  PT Class (Do Not Modify): Inpatient [101]  PT Acc Code (Do Not Modify): Private [1]       Medical History Past Medical History:  Diagnosis Date  . Anxiety   . Asthma   . Bipolar disorder ([place])   . Cataracts, bilateral   . Depression   . GERD (gastroesophageal reflux disease)   . Hydatid cyst    Left  . Hypertension   . Kidney problem    RT kidney is smaller -> decreased output  . Migraine   . Osteoporosis 11/2016   T score -2.8  improved from prior DEXA  . Right rotator cuff tear     Allergies Allergies  Allergen Reactions  . Divalproex Sodium     Became comatose when she took Depakote and Geodon together.  Lindajo Royal [Ziprasidone Hydrochloride]     Became comatose when she took Depakote and Geodon together.  . Nsaids     "kidney problem"  . Seroquel [Quetiapine Fumerate]     IV Location/Drains/Wounds Patient Lines/Drains/Airways Status   Active Line/Drains/Airways    Name:   Placement date:   Placement time:   Site:   Days:   Peripheral IV 07/12/18 Right Hand   07/12/18    1111    Hand   less than 1   Incision 08/26/12 Shoulder Right   08/26/12    1419     2146          Labs/Imaging Results for orders placed  or performed during the hospital encounter of 07/12/18 (from the past 48 hour(s))  CBG monitoring, ED     Status: Abnormal   Collection Time: 07/12/18 11:06 AM  Result Value Ref Range   Glucose-Capillary 111 (H) 70 - 99 mg/dL  Basic metabolic panel     Status: Abnormal   Collection Time: 07/12/18 11:07 AM  Result Value Ref Range   Sodium 140 135 - 145 mmol/L   Potassium 4.2 3.5 - 5.1 mmol/L   Chloride 113 (H) 98 - 111 mmol/L   CO2 22 22 - 32 mmol/L   Glucose, Bld 132 (H) 70 - 99 mg/dL   BUN 29 (H) 8 - 23 mg/dL   Creatinine, Ser 1.63 (H) 0.44 - 1.00 mg/dL   Calcium 9.6 8.9 - 10.3 mg/dL   GFR calc non Af Amer 32 (L) >60 mL/min   GFR calc Af Amer 37 (L) >60 mL/min   Anion gap 5 5 - 15    Comment: Performed at [facility], [place] [address] [place], [place] [address]  CBC     Status: None   Collection Time: 07/12/18 11:07 AM  Result Value Ref Range   WBC 9.6 4.0 - 10.5 K/uL   RBC 4.12 3.87 - 5.11 MIL/uL   Hemoglobin 12.1 12.0 - 15.0 g/dL  HCT 40.2 36.0 - 46.0 %   MCV 97.6 80.0 - 100.0 fL   MCH 29.4 26.0 - 34.0 pg   MCHC 30.1 30.0 - 36.0 g/dL   RDW 11.9 11.5 - 15.5 %   Platelets 166 150 - 400 K/uL   nRBC 0.0 0.0 - 0.2 %    Comment: Performed at [facility], [place] [address], [place], [place] [address]  Troponin I - ONCE - STAT     Status: None   Collection Time: 07/12/18 11:07 AM  Result Value Ref Range   Troponin I <0.03 <0.03 ng/mL    Comment: Performed at [facility], [place] [address], [place], [place] [address]   Dg Thoracic Spine 4v  Result Date: 07/12/2018 CLINICAL DATA:  [place]. Pain. EXAM: LUMBAR SPINE - COMPLETE 4+ VIEW; THORACIC SPINE - 4+ VIEW COMPARISON:  None. FINDINGS: Alignment is normal. On the lateral radiographs, there is concern for slight wedging of T12, inferior endplate, suspicious for fracture. In addition, on the lateral radiograph, there is inferior endplate irregularity at L1, also concerning for  fracture. Recommend CT of the lumbar spine to include the lower thoracic region up to T10 for confirmation. Lower lumbar facet arthropathy. Disc space narrowing at L4-5 and L5-S1. No rib fracture. No lung pathology. Pedicles are intact. IMPRESSION: Suspicion for mild T12 and L1 compression fractures. Recommend CT of the lumbar spine to include the lower thoracic region for confirmation. Electronically Signed   By: Staci Righter M.D.   On: 07/12/2018 12:25   Dg Lumbar Spine Complete  Result Date: 07/12/2018 CLINICAL DATA:  [place]. Pain. EXAM: LUMBAR SPINE - COMPLETE 4+ VIEW; THORACIC SPINE - 4+ VIEW COMPARISON:  None. FINDINGS: Alignment is normal. On the lateral radiographs, there is concern for slight wedging of T12, inferior endplate, suspicious for fracture. In addition, on the lateral radiograph, there is inferior endplate irregularity at L1, also concerning for fracture. Recommend CT of the lumbar spine to include the lower thoracic region up to T10 for confirmation. Lower lumbar facet arthropathy. Disc space narrowing at L4-5 and L5-S1. No rib fracture. No lung pathology. Pedicles are intact. IMPRESSION: Suspicion for mild T12 and L1 compression fractures. Recommend CT of the lumbar spine to include the lower thoracic region for confirmation. Electronically Signed   By: Staci Righter M.D.   On: 07/12/2018 12:25   Ct Lumbar Spine Wo Contrast  Result Date: 07/12/2018 CLINICAL DATA:  Syncope and fall. Low back pain. Thoracolumbar compression fractures on radiographs. EXAM: CT LUMBAR SPINE WITHOUT CONTRAST TECHNIQUE: Multidetector CT imaging of the lumbar spine was performed without intravenous contrast administration. Multiplanar CT image reconstructions were also generated. COMPARISON:  Thoracic and lumbar spine radiographs 07/12/2018 FINDINGS: Segmentation: 5 lumbar type vertebrae. Alignment: Normal. Vertebrae: Acute appearing inferior endplate compression fractures are present at T12 and L1 with 10-15%  height loss. The L1 fracture involves the posterior vertebral body cortex with 3 mm of retropulsion. No convincing posterior element fracture is identified. No suspicious osseous lesion is seen. Bridging osteophytes are noted across the anterosuperior aspects of the SI joints bilaterally. Paraspinal and other soft tissues: Marked asymmetric right renal atrophy. Disc levels: Lower lumbar disc degeneration with moderate disc space narrowing, degenerative endplate sclerosis and spurring, and vacuum disc at L4-5 and L5-S1. Mild spinal stenosis and mild-to-moderate bilateral neural foraminal stenosis at L4-5 due to disc bulging and mild posterior element hypertrophy. Mild bilateral neural foraminal stenosis at L5-S1. IMPRESSION: 1. Acute T12 and L1 compression fractures. 3  mm retropulsion at L1 without significant stenosis. 2. Lower lumbar disc and facet degeneration, most notable at L4-5 where there is mild spinal and mild-to-moderate bilateral neural foraminal stenosis. Electronically Signed   By: Logan Bores M.D.   On: 07/12/2018 15:45   EKG Interpretation  Date/Time:  Monday July 12 2018 11:00:45 EST Ventricular Rate:  57 PR Interval:    QRS Duration: 113 QT Interval:  448 QTC Calculation: 437 R Axis:   24 Text Interpretation:  Sinus rhythm Prolonged PR interval Incomplete right bundle branch block Baseline wander Abnormal ekg Confirmed by Carmin Muskrat [phone]) on 07/12/2018 11:13:59 AM Also confirmed by Carmin Muskrat ([id number]), editor Philomena Doheny [phone])  on 07/12/2018 1:02:39 PM   Pending Labs Unresulted Labs (From admission, onward)    Start     Ordered   07/12/18 1111  Urinalysis, Routine w reflex microscopic  Once,   STAT     07/12/18 1111   Signed and Held  HIV antibody (Routine Testing)  Once,   R     Signed and Held   Signed and Held  CBC  (enoxaparin (LOVENOX)    CrCl >/= 30 ml/min)  Once,   R    Comments:  Baseline for enoxaparin therapy IF NOT ALREADY DRAWN.  Notify MD if PLT <  100 K.    Signed and Held   Signed and Held  Creatinine, serum  (enoxaparin (LOVENOX)    CrCl >/= 30 ml/min)  Once,   R    Comments:  Baseline for enoxaparin therapy IF NOT ALREADY DRAWN.    Signed and Held   Signed and Held  Creatinine, serum  (enoxaparin (LOVENOX)    CrCl >/= 30 ml/min)  Weekly,   R    Comments:  while on enoxaparin therapy    Signed and Held   Signed and Held  Comprehensive metabolic panel  Tomorrow morning,   R     Signed and Held   Signed and Held  CBC  Tomorrow morning,   R     Signed and Held          Vitals/Pain Today's Vitals   07/12/18 1330 07/12/18 1544 07/12/18 1630 07/12/18 1652  BP: 134/79 (!) 149/81 (!) 151/94   Pulse: (!) 58 61 64   Resp: 15 17 16    Temp:      TempSrc:      SpO2: 97% 97% 98%   Weight:      Height:      PainSc:    8     Isolation Precautions No active isolations  Medications Medications  0.9 %  sodium chloride infusion (has no administration in time range)  HYDROmorphone (DILAUDID) injection 1 mg (has no administration in time range)  oxyCODONE-acetaminophen (PERCOCET/ROXICET) 5-325 MG per tablet 2 tablet (has no administration in time range)  HYDROmorphone (DILAUDID) injection 0.5 mg (0.5 mg Intravenous Given 07/12/18 1223)  sodium chloride 0.9 % bolus 1,000 mL (0 mLs Intravenous Stopped 07/12/18 1503)    Mobility walks with person assist

## 2018-07-12 NOTE — ED Notes (Signed)
Bed: EA33 Expected date:  Expected time:  Means of arrival:  Comments: EMS- syncope/fall/back pain

## 2018-07-12 NOTE — H&P (Signed)
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 Significant psychiatric history Headaches Presented to [facility] emergency room with 3 falls starting at 4 AM this morning Awoke to get some juice from the fridge had assisted fall while holding onto the door subsequently had 2 other falls resulting in the third fall when she was at her refrigerator door at around 415 and lost consciousness until about 425-she did not have incontinence of urine or stool at the time no tongue biting was reported however there were no witnesses She has had this episode a couple of times in the past year No new medications-about a month ago was started on a new psychiatric medication but discontinued it close to 3 weeks ago without any other effect No headaches, no dysuria, no burning in the urine, no dark or tarry stool, no diarrhea,   ED Course: Patient was brought and had severe pain was given a bolus of IV fluid and also Dilaudid and she still has 8/10 pain  Work-up revealed worsening azotemia with BUN/creatinine 29/1.6 LFTs not done troponin less than 0.03 CT lumbar scope eventually done  Review of Systems:   Negative for fever, visual changes, no cp  No reported loss urine No dysuria No cough/nmo fever no recent diarr No dyshagia  Past Medical History:  Diagnosis Date  . Anxiety   . Asthma   . Bipolar disorder ([place])   . Cataracts, bilateral   . Depression   . GERD (gastroesophageal reflux disease)   . Hydatid cyst    Left  . Hypertension   . Kidney problem    RT kidney is smaller -> decreased output  . Migraine   . Osteoporosis 11/2016   T score -2.8  improved from prior DEXA  . Right rotator cuff tear     Past Surgical History:  Procedure Laterality Date  . BUNIONECTOMY     L foot 07/2011 --RT foot 10/2011  .  CATARACT EXTRACTION    . Fimbrioplasty-bilateral-Lysis of adhesions-Excision of Left ovarian cyst  1986  . Hydatid cyst     Left-Adhesions  . ROTATOR CUFF REPAIR     LEFT  . SHOULDER OPEN ROTATOR CUFF REPAIR Right 08/26/2012   Procedure: RIGHT SHOULDER MINI OPEN ROTATOR CUFF REPAIR POSSIBLE PATCH GRAFT AND SUBACROMIAL DECOMPRESSION;  Surgeon: Johnn Hai, MD;  Location: [facility];  Service: Orthopedics;  Laterality: Right;     reports that she has never smoked. She has never used smokeless tobacco. She reports current alcohol use. She reports that she does not use drugs. Mobility: Patient is independent at baseline drink  Allergies  Allergen Reactions  . Divalproex Sodium     Became comatose when she took Depakote and Geodon together.  Lindajo Royal [Ziprasidone Hydrochloride]     Became comatose when she took Depakote and Geodon together.  . Nsaids     "kidney problem"  . Seroquel [Quetiapine Fumerate]     Family History  Problem Relation Age of Onset  . Diabetes Father   . Hypertension Father   . Heart disease Father   . Stroke Father   . Cancer Father        Lymphoma  . Hypertension Mother   . Heart disease Mother   . Stroke Mother   . Stroke Paternal Uncle      Prior to  Admission medications   Medication Sig Start Date End Date Taking? Authorizing Provider  Ascorbic Acid (VITAMIN C PO) Take 1 tablet by mouth daily.   Yes [provider]  aspirin 81 MG tablet Take 325 mg by mouth 3 (three) times a week. Min, Wed, Fri   Yes [provider]  CALCIUM PO Take 1 tablet by mouth daily.   Yes [provider]  cholecalciferol (VITAMIN D) 1000 UNITS tablet Take 1,000 Units by mouth daily.   Yes [provider]  clonazePAM (KLONOPIN) 0.5 MG tablet Take 1 tablet (0.5 mg total) by mouth 3 (three) times daily as needed for anxiety (and insomnia). 04/15/18  Yes Cottle, Billey Co., MD  denosumab (PROLIA) 60 MG/ML SOLN injection Inject 60 mg into the  skin every 6 (six) months. Administer in upper arm, thigh, or abdomen   Yes [provider]  fish oil-omega-3 fatty acids 1000 MG capsule Take 1 g by mouth daily.   Yes [provider]  fluticasone (FLONASE) 50 MCG/ACT nasal spray Place 2 sprays into the nose daily.   Yes [provider]  Fluticasone Furoate (ARNUITY ELLIPTA IN) Inhale 1 puff into the lungs at bedtime.    Yes [provider]  Glycerin-Hypromellose-PEG 400 ([employer] DRY EYE RELIEF OP) Apply 1 drop to eye at bedtime as needed (dry eyes).   Yes [provider]  guaiFENesin (MUCINEX PO) Take 1 tablet by mouth at bedtime.   Yes [provider]  IRON PO Take 1 tablet by mouth daily.   Yes [provider]  lamoTRIgine (LAMICTAL) 100 MG tablet TAKE 1 TABLET BY MOUTH IN  THE MORNING AND 2 TABLETS  AT BEDTIME. 06/28/18  Yes Cottle, Billey Co., MD  Multiple Vitamin (MULTIVITAMIN WITH MINERALS) TABS Take 1 tablet by mouth daily.   Yes [provider]  olmesartan (BENICAR) 40 MG tablet Take 40 mg by mouth daily.   Yes [provider]  pantoprazole (PROTONIX) 40 MG tablet Take 40 mg by mouth daily.   Yes [provider]  phenylephrine (SUDAFED PE) 10 MG TABS tablet Take 10 mg by mouth at bedtime.   Yes [provider]  promethazine (PHENERGAN) 25 MG tablet Take 25 mg by mouth every 6 (six) hours as needed. Nausea   Yes [provider]  tiZANidine (ZANAFLEX) 2 MG tablet Take 2 mg by mouth as needed (migraines).  06/28/18  Yes [provider]  topiramate (TOPAMAX) 200 MG tablet Take 200 mg by mouth 2 (two) times daily.    Yes [provider]  traZODone (DESYREL) 100 MG tablet TAKE 3 TABLETS BY MOUTH  EVERY NIGHT AT BEDTIME Patient taking differently: Take 300 mg by mouth at bedtime.  04/19/18  Yes Cottle, Billey Co., MD  zonisamide (ZONEGRAN) 100 MG capsule Take 300 mg by mouth daily.    Yes [provider]    HYDROcodone-acetaminophen (NORCO/VICODIN) 5-325 MG tablet Take 1 tablet by mouth every 6 (six) hours as needed for severe pain. 07/12/18   Carmin Muskrat, MD    Physical Exam:  Vitals:   07/12/18 1330 07/12/18 1544  BP: 134/79 (!) 149/81  Pulse: (!) 58 61  Resp: 15 17  Temp:    SpO2: 97% 97%     Awake alert pleasant no distress EOMI NCAT looking much younger than stated age no icterus no pallor no JVD no bruit  S1-S2 no murmur  Chest is clinically clear no added sound  Lower back has  significant increase in lumbar lordosis and muscle spasm  Abdomen is soft nontender no rebound no guarding  She is unable to straight leg raise on right on left side but sensation to both feet is intact she has equivocal reflexes power to dorsi and plantar flexion are intact she is able to externally and internally rotate hip without deficit  I have personally reviewed following labs and imaging studies  Labs:   As above  Imaging studies:   As above  Medical tests:   EKG independently reviewed: Sinus-sinus bradycardia PR interval 0.12 QRS axis 30 degrees?  Right bundle pattern no ST-T wave changes overall normal EKG and compared to prior EKG done in 08/29/2012 is essentially normal and unchanged  Test discussed with performing physician:  Discussed with Dr. Vanita Panda  Decision to obtain old records:   Reviewed  Review and summation of old records:   Reviewed extensively  Active Problems:   Fall   Assessment/Plan Fall with syncope Etiology is unclear-patient is on Benicar so first thought it is probably AKI Differential diagnosis may be seizure versus orthostatic hypotension which we will check for both lastly his arrhythmia which does not seem to be showing up on monitors we will keep on telemetry nonetheless She also may have an element of polypharmacy as she is on Zanaflex Topamax Klonopin Lamictal trazodone and may need to come down off of these doses as this may have  caused her to be syncopal I will have a discussion with her in the morning about it I will probably hold on EEG monitoring unless there is a recurrence it sounds like her symptoms are pretty positional and she has had this before last thought would be that she has a migraine variant that may sometimes cause this-we will obtain MRI brain in addition to rule out any stenosis AKI Baseline creatinine is less than 1.4 here she is 29/1.6 so we will hydrate her saline 75 cc an hour she is received a bolus already will discontinue olmesartan and will monitor Compression fracture acute T12-L1 -have consulted IR to see the patient obtaining MRI if there is a need we will involve neurosurgery as there is some retropulsion Placed TLSO brace Increase Dilaudid to 1 mg every 2 hours for severe pain and use 2 tablets of Percocet every 3 as needed for moderate pain note that she may have some opiate tolerance Migraines Continuing Topamax 200 twice daily, Zonegran 300 daily Bipolar- Gets her meds from psychiatrist continue lamotrigine 100 a.m. 200 p.m. Klonopin 0.5 3 times daily as needed, trazodone 300 at bedtime Chronic back pains Related to her work-continue tizanidine 2 mg Osteoporosis Will need testing as an outpatient with DEXA note that she is on Prolia and calcium    Severity of Illness: The appropriate patient status for this patient is INPATIENT. Inpatient status is judged to be reasonable and necessary in order to provide the required intensity of service to ensure the patient's safety. The patient's presenting symptoms, physical exam findings, and initial radiographic and laboratory data in the context of their chronic comorbidities is felt to place them at high risk for further clinical deterioration. Furthermore, it is not anticipated that the patient will be medically stable for discharge from the hospital within 2 midnights of admission. The following factors support the patient status of  inpatient.   " The patient's presenting symptoms include syncope fall without cause. " The worrisome physical exam findings include fracture of back. " The initial radiographic and laboratory data are  worrisome because of risk for instability. " The chronic co-morbidities include hypertension psychiatric illness prior falls.   * I certify that at the point of admission it is my clinical judgment that the patient will require inpatient hospital care spanning beyond 2 midnights from the point of admission due to high intensity of service, high risk for further deterioration and high frequency of surveillance required.*     DVT prophylaxis: Lovenox Code Status: Full Family Communication: None Consults called: Interventional radiology consulted  Time spent: 21 minutes  Janika Jedlicka, MD  [facility] Direct contact: [phone] --[place]  --[email or web address]; password [email or web address]  7PM-7AM contact night coverage as above  07/12/2018, 4:35 PM

## 2018-07-12 NOTE — ED Notes (Signed)
Patient transported to X-ray 

## 2018-07-12 NOTE — ED Provider Notes (Signed)
[place] DEPT Provider Note   CSN: [id number] Arrival date & time: 07/12/18  1051     History   Chief Complaint Chief Complaint  Patient presents with  . Fall  . Loss of Consciousness    HPI Kelli Morales is a 68 y.o. female.  HPI Presents after a fall with syncope. Patient has no history of syncope.  She does acknowledge multiple other medical issues, takes medication regularly. She has no history of arrhythmia, nor CAD either. She was in her usual state of health until soon after awakening this morning, about 4 hours ago. She notes that she had 3 consecutive episodes of syncope, each with lightheadedness, none with chest pain. The third episode caused her to fall, striking either countertop or floor with force. Since that time she has had severe pain from her mid back inferiorly, midline without new weakness in her lower extremities, but with pain with any range of motion or ambulation. No current head pain, neck pain, chest pain, abdominal pain. No medication taken for pain relief.  Past Medical History:  Diagnosis Date  . Anxiety   . Asthma   . Bipolar disorder ([place])   . Cataracts, bilateral   . Depression   . GERD (gastroesophageal reflux disease)   . Hydatid cyst    Left  . Hypertension   . Kidney problem    RT kidney is smaller -> decreased output  . Migraine   . Osteoporosis 11/2016   T score -2.8  improved from prior DEXA  . Right rotator cuff tear     Patient Active Problem List   Diagnosis Date Noted  . 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 Right rotator cuff tear 08/26/2012  . Hypertension   . Depression   . Kidney problem   . Osteoporosis   . Asthma   . Bipolar disorder ([place])   . Migraine   . Hydatid cyst     Past Surgical History:  Procedure Laterality Date  . BUNIONECTOMY     L foot 07/2011 --RT foot 10/2011  . CATARACT EXTRACTION    . Fimbrioplasty-bilateral-Lysis of adhesions-Excision of Left ovarian cyst  1986  .  Hydatid cyst     Left-Adhesions  . ROTATOR CUFF REPAIR     LEFT  . SHOULDER OPEN ROTATOR CUFF REPAIR Right 08/26/2012   Procedure: RIGHT SHOULDER MINI OPEN ROTATOR CUFF REPAIR POSSIBLE PATCH GRAFT AND SUBACROMIAL DECOMPRESSION;  Surgeon: Johnn Hai, MD;  Location: [facility];  Service: Orthopedics;  Laterality: Right;     OB History    Gravida  2   Para      Term      Preterm      AB  1   Living  1     SAB      TAB      Ectopic      Multiple      Live Births               Home Medications    Prior to Admission medications   Medication Sig Start Date End Date Taking? Authorizing Provider  Ascorbic Acid (VITAMIN C PO) Take 1 tablet by mouth daily.   Yes [provider]  aspirin 81 MG tablet Take 325 mg by mouth 3 (three) times a week. Min, Wed, Fri   Yes [provider]  CALCIUM PO Take 1 tablet by mouth daily.   Yes [provider]  cholecalciferol (VITAMIN D) 1000 UNITS tablet Take  1,000 Units by mouth daily.   Yes [provider]  clonazePAM (KLONOPIN) 0.5 MG tablet Take 1 tablet (0.5 mg total) by mouth 3 (three) times daily as needed for anxiety (and insomnia). 04/15/18  Yes Cottle, Billey Co., MD  denosumab (PROLIA) 60 MG/ML SOLN injection Inject 60 mg into the skin every 6 (six) months. Administer in upper arm, thigh, or abdomen   Yes [provider]  fish oil-omega-3 fatty acids 1000 MG capsule Take 1 g by mouth daily.   Yes [provider]  fluticasone (FLONASE) 50 MCG/ACT nasal spray Place 2 sprays into the nose daily.   Yes [provider]  Fluticasone Furoate (ARNUITY ELLIPTA IN) Inhale 1 puff into the lungs at bedtime.    Yes [provider]  Glycerin-Hypromellose-PEG 400 ([employer] DRY EYE RELIEF OP) Apply 1 drop to eye at bedtime as needed (dry eyes).   Yes [provider]  guaiFENesin (MUCINEX PO) Take 1 tablet by mouth at bedtime.   Yes [provider]  IRON PO Take 1  tablet by mouth daily.   Yes [provider]  lamoTRIgine (LAMICTAL) 100 MG tablet TAKE 1 TABLET BY MOUTH IN  THE MORNING AND 2 TABLETS  AT BEDTIME. 06/28/18  Yes Cottle, Billey Co., MD  Multiple Vitamin (MULTIVITAMIN WITH MINERALS) TABS Take 1 tablet by mouth daily.   Yes [provider]  olmesartan (BENICAR) 40 MG tablet Take 40 mg by mouth daily.   Yes [provider]  pantoprazole (PROTONIX) 40 MG tablet Take 40 mg by mouth daily.   Yes [provider]  phenylephrine (SUDAFED PE) 10 MG TABS tablet Take 10 mg by mouth at bedtime.   Yes [provider]  promethazine (PHENERGAN) 25 MG tablet Take 25 mg by mouth every 6 (six) hours as needed. Nausea   Yes [provider]  tiZANidine (ZANAFLEX) 2 MG tablet Take 2 mg by mouth as needed (migraines).  06/28/18  Yes [provider]  topiramate (TOPAMAX) 200 MG tablet Take 200 mg by mouth 2 (two) times daily.    Yes [provider]  traZODone (DESYREL) 100 MG tablet TAKE 3 TABLETS BY MOUTH  EVERY NIGHT AT BEDTIME Patient taking differently: Take 300 mg by mouth at bedtime.  04/19/18  Yes Cottle, Billey Co., MD  zonisamide (ZONEGRAN) 100 MG capsule Take 300 mg by mouth daily.    Yes [provider]    Family History Family History  Problem Relation Age of Onset  . Diabetes Father   . Hypertension Father   . Heart disease Father   . Stroke Father   . Cancer Father        Lymphoma  . Hypertension Mother   . Heart disease Mother   . Stroke Mother   . Stroke Paternal Uncle     Social History Social History   Tobacco Use  . Smoking status: Never Smoker  . Smokeless tobacco: Never Used  Substance Use Topics  . Alcohol use: Yes    Alcohol/week: 0.0 standard drinks    Comment: Rare  . Drug use: No     Allergies   Divalproex sodium; Geodon [ziprasidone hydrochloride]; Nsaids; and Seroquel [quetiapine fumerate]   Review of Systems Review of Systems    Constitutional:       Per HPI, otherwise negative  HENT:       Per HPI, otherwise negative  Respiratory:       Per HPI, otherwise negative  Cardiovascular:  Per HPI, otherwise negative  Gastrointestinal: Negative for vomiting.  Endocrine:       Negative aside from HPI  Genitourinary:       Neg aside from HPI   Musculoskeletal:       Per HPI, otherwise negative  Skin: Negative.   Neurological: Negative for syncope.  Psychiatric/Behavioral:       History of bipolar, notes that her symptoms are largely controlled with medication.     Physical Exam Updated Vital Signs BP (!) 149/81   Pulse 61   Temp 98.1 F (36.7 C) (Oral)   Resp 17   Ht 5' 5.5" (1.664 m)   Wt 67.7 kg   SpO2 97%   BMI 24.47 kg/m   Physical Exam Vitals signs and nursing note reviewed.  Constitutional:      General: She is not in acute distress.    Appearance: She is well-developed.  HENT:     Head: Normocephalic and atraumatic.  Eyes:     Conjunctiva/sclera: Conjunctivae normal.  Cardiovascular:     Rate and Rhythm: Normal rate and regular rhythm.  Pulmonary:     Effort: Pulmonary effort is normal. No respiratory distress.     Breath sounds: Normal breath sounds. No stridor.  Abdominal:     General: There is no distension.  Musculoskeletal:     Comments: Patient incapable of flexing her spine entirely to set up for full exam, but no gross deformities, patient moves all extremities spontaneously.  Skin:    General: Skin is warm and dry.  Neurological:     Mental Status: She is alert and oriented to person, place, and time.     Cranial Nerves: No cranial nerve deficit.      ED Treatments / Results  Labs (all labs ordered are listed, but only abnormal results are displayed) Labs Reviewed  BASIC METABOLIC PANEL - Abnormal; Notable for the following components:      Result Value   Chloride 113 (*)    Glucose, Bld 132 (*)    BUN 29 (*)    Creatinine, Ser 1.63 (*)    GFR calc non Af  Amer 32 (*)    GFR calc Af Amer 37 (*)    All other components within normal limits  CBG MONITORING, ED - Abnormal; Notable for the following components:   Glucose-Capillary 111 (*)    All other components within normal limits  CBC  TROPONIN I  URINALYSIS, ROUTINE W REFLEX MICROSCOPIC  CBG MONITORING, ED    EKG EKG Interpretation  Date/Time:  Monday July 12 2018 11:00:45 EST Ventricular Rate:  57 PR Interval:    QRS Duration: 113 QT Interval:  448 QTC Calculation: 437 R Axis:   24 Text Interpretation:  Sinus rhythm Prolonged PR interval Incomplete right bundle branch block Baseline wander Abnormal ekg Confirmed by Carmin Muskrat [phone]) on 07/12/2018 11:13:59 AM Also confirmed by Carmin Muskrat [id number]), editor Philomena Doheny [phone])  on 07/12/2018 1:02:39 PM   Radiology Dg Thoracic Spine 4v  Result Date: 07/12/2018 CLINICAL DATA:  [place]. Pain. EXAM: LUMBAR SPINE - COMPLETE 4+ VIEW; THORACIC SPINE - 4+ VIEW COMPARISON:  None. FINDINGS: Alignment is normal. On the lateral radiographs, there is concern for slight wedging of T12, inferior endplate, suspicious for fracture. In addition, on the lateral radiograph, there is inferior endplate irregularity at L1, also concerning for fracture. Recommend CT of the lumbar spine to include the lower thoracic region up to T10 for confirmation. Lower lumbar facet arthropathy. Disc space  narrowing at L4-5 and L5-S1. No rib fracture. No lung pathology. Pedicles are intact. IMPRESSION: Suspicion for mild T12 and L1 compression fractures. Recommend CT of the lumbar spine to include the lower thoracic region for confirmation. Electronically Signed   By: Staci Righter M.D.   On: 07/12/2018 12:25   Dg Lumbar Spine Complete  Result Date: 07/12/2018 CLINICAL DATA:  [place]. Pain. EXAM: LUMBAR SPINE - COMPLETE 4+ VIEW; THORACIC SPINE - 4+ VIEW COMPARISON:  None. FINDINGS: Alignment is normal. On the lateral radiographs, there is concern for slight wedging of T12,  inferior endplate, suspicious for fracture. In addition, on the lateral radiograph, there is inferior endplate irregularity at L1, also concerning for fracture. Recommend CT of the lumbar spine to include the lower thoracic region up to T10 for confirmation. Lower lumbar facet arthropathy. Disc space narrowing at L4-5 and L5-S1. No rib fracture. No lung pathology. Pedicles are intact. IMPRESSION: Suspicion for mild T12 and L1 compression fractures. Recommend CT of the lumbar spine to include the lower thoracic region for confirmation. Electronically Signed   By: Staci Righter M.D.   On: 07/12/2018 12:25   Ct Lumbar Spine Wo Contrast  Result Date: 07/12/2018 CLINICAL DATA:  Syncope and fall. Low back pain. Thoracolumbar compression fractures on radiographs. EXAM: CT LUMBAR SPINE WITHOUT CONTRAST TECHNIQUE: Multidetector CT imaging of the lumbar spine was performed without intravenous contrast administration. Multiplanar CT image reconstructions were also generated. COMPARISON:  Thoracic and lumbar spine radiographs 07/12/2018 FINDINGS: Segmentation: 5 lumbar type vertebrae. Alignment: Normal. Vertebrae: Acute appearing inferior endplate compression fractures are present at T12 and L1 with 10-15% height loss. The L1 fracture involves the posterior vertebral body cortex with 3 mm of retropulsion. No convincing posterior element fracture is identified. No suspicious osseous lesion is seen. Bridging osteophytes are noted across the anterosuperior aspects of the SI joints bilaterally. Paraspinal and other soft tissues: Marked asymmetric right renal atrophy. Disc levels: Lower lumbar disc degeneration with moderate disc space narrowing, degenerative endplate sclerosis and spurring, and vacuum disc at L4-5 and L5-S1. Mild spinal stenosis and mild-to-moderate bilateral neural foraminal stenosis at L4-5 due to disc bulging and mild posterior element hypertrophy. Mild bilateral neural foraminal stenosis at L5-S1.  IMPRESSION: 1. Acute T12 and L1 compression fractures. 3 mm retropulsion at L1 without significant stenosis. 2. Lower lumbar disc and facet degeneration, most notable at L4-5 where there is mild spinal and mild-to-moderate bilateral neural foraminal stenosis. Electronically Signed   By: Logan Bores M.D.   On: 07/12/2018 15:45    Procedures Procedures (including critical care time)  Medications Ordered in ED Medications  HYDROmorphone (DILAUDID) injection 0.5 mg (0.5 mg Intravenous Given 07/12/18 1223)  sodium chloride 0.9 % bolus 1,000 mL (0 mLs Intravenous Stopped 07/12/18 1503)     Initial Impression / Assessment and Plan / ED Course  I have reviewed the triage vital signs and the nursing notes.  Pertinent labs & imaging results that were available during my care of the patient were reviewed by me and considered in my medical decision making (see chart for details).     4:02 PM Patient calm, in no distress. I have reviewed remaining labs, CT with the patient. We discussed indication for admission given concern for syncope, need for addressing her multiple compression fractures. Patient with a strong preference for discharge She has been hemodynamically stable, with no arrhythmia during hours of monitoring here, has had no chest pain, and given labs consistent with dehydration, there is suspicion for this  contributing to her episodes of syncope, no evidence for sustained arrhythmia. No ongoing chest pain. Given the patient's capacity to make a request, she was discharged in stable condition.  Patient received TLSO brace, by radiology tech.  Final Clinical Impressions(s) / ED Diagnoses   Final diagnoses:  Fall, initial encounter  Syncope and collapse  Compression fracture of L1 vertebra, initial encounter ([place])  Compression fracture of T12 vertebra, initial encounter [facility])    ED Discharge Orders         Ordered    HYDROcodone-acetaminophen (NORCO/VICODIN) 5-325 MG tablet  Every 6  hours PRN,   Status:  Discontinued     07/12/18 1609    HYDROcodone-acetaminophen (NORCO/VICODIN) 5-325 MG tablet  Every 6 hours PRN     07/12/18 1611           Carmin Muskrat, MD 07/12/18 1612

## 2018-07-12 NOTE — ED Triage Notes (Signed)
Transported by [facility] from home-- patient reports that she experienced 3 syncopal episodes today and the 3rd episode caused her to experience a fall. Syncopal episodes have been intermittent for at least 5 years per patient. Denies hitting her head, no blood thinner usage. +lower back pain. VSS with EMS. CBG 140 mg/dl.

## 2018-07-13 DIAGNOSIS — W19XXXA Unspecified fall, initial encounter: Secondary | ICD-10-CM

## 2018-07-13 LAB — CBC
HCT: 35.4 % — ABNORMAL LOW (ref 36.0–46.0)
Hemoglobin: 10.6 g/dL — ABNORMAL LOW (ref 12.0–15.0)
MCH: 29.6 pg (ref 26.0–34.0)
MCHC: 29.9 g/dL — ABNORMAL LOW (ref 30.0–36.0)
MCV: 98.9 fL (ref 80.0–100.0)
PLATELETS: 141 10*3/uL — AB (ref 150–400)
RBC: 3.58 MIL/uL — ABNORMAL LOW (ref 3.87–5.11)
RDW: 11.8 % (ref 11.5–15.5)
WBC: 5 10*3/uL (ref 4.0–10.5)
nRBC: 0 % (ref 0.0–0.2)

## 2018-07-13 LAB — PROTIME-INR
INR: 1.05
Prothrombin Time: 13.6 seconds (ref 11.4–15.2)

## 2018-07-13 LAB — COMPREHENSIVE METABOLIC PANEL
ALK PHOS: 61 U/L (ref 38–126)
ALT: 20 U/L (ref 0–44)
AST: 21 U/L (ref 15–41)
Albumin: 3.6 g/dL (ref 3.5–5.0)
Anion gap: 6 (ref 5–15)
BUN: 19 mg/dL (ref 8–23)
CO2: 21 mmol/L — ABNORMAL LOW (ref 22–32)
Calcium: 8.8 mg/dL — ABNORMAL LOW (ref 8.9–10.3)
Chloride: 113 mmol/L — ABNORMAL HIGH (ref 98–111)
Creatinine, Ser: 1.38 mg/dL — ABNORMAL HIGH (ref 0.44–1.00)
GFR calc Af Amer: 46 mL/min — ABNORMAL LOW (ref >60)
GFR calc non Af Amer: 39 mL/min — ABNORMAL LOW (ref >60)
Glucose, Bld: 103 mg/dL — ABNORMAL HIGH (ref 70–99)
Potassium: 3.3 mmol/L — ABNORMAL LOW (ref 3.5–5.1)
Sodium: 140 mmol/L (ref 135–145)
Total Bilirubin: 0.5 mg/dL (ref 0.3–1.2)
Total Protein: 6 g/dL — ABNORMAL LOW (ref 6.5–8.1)

## 2018-07-13 LAB — HIV ANTIBODY (ROUTINE TESTING W REFLEX): HIV Screen 4th Generation wRfx: NONREACTIVE

## 2018-07-13 MED ORDER — CEFAZOLIN SODIUM-DEXTROSE 2-4 GM/100ML-% IV SOLN
2.0000 g | INTRAVENOUS | Status: AC
  Administered 2018-07-14: 2 g via INTRAVENOUS

## 2018-07-13 MED ORDER — LIP MEDEX EX OINT
TOPICAL_OINTMENT | CUTANEOUS | Status: AC
  Administered 2018-07-13: 1
  Filled 2018-07-13: qty 7

## 2018-07-13 MED ORDER — POTASSIUM CHLORIDE CRYS ER 20 MEQ PO TBCR
40.0000 meq | EXTENDED_RELEASE_TABLET | Freq: Every day | ORAL | Status: DC
  Administered 2018-07-13 – 2018-07-15 (×3): 40 meq via ORAL
  Filled 2018-07-13 (×4): qty 2

## 2018-07-13 MED ORDER — ENOXAPARIN SODIUM 40 MG/0.4ML ~~LOC~~ SOLN
40.0000 mg | SUBCUTANEOUS | Status: DC
  Administered 2018-07-14: 40 mg via SUBCUTANEOUS
  Filled 2018-07-13: qty 0.4

## 2018-07-13 NOTE — Progress Notes (Signed)
PT Cancellation Note  Patient Details Name: [name] MRN: [id number] DOB: 08/26/1950   Cancelled Treatment:    Reason Eval/Treat Not Completed: Medical issues which prohibited therapy, tentative vertebral body augmentation 2/5.   Claretha Cooper 07/13/2018, 11:37 AM  [place] Pager [phone] Office [phone]

## 2018-07-13 NOTE — Consult Note (Addendum)
Chief Complaint: Patient was seen in consultation today for T12/L1 vertebral body augmentation/kyphoplasty Chief Complaint  Patient presents with  . Fall  . Loss of Consciousness    Referring Physician(s): Samtani,J  Supervising Physician: Jacqulynn Cadet  Patient Status: [facility] - In-pt  History of Present Illness: Kelli Morales is a 68 y.o. female with history of bipolar disorder, depression, GERD, hypertension, migraines and osteoporosis who was recently admitted to [facility] following fall and syncopal episode at home with subsequent back pain.  Etiology of syncope felt most likely to be from hypotension and volume depletion.  Creatinine was elevated on admission and currently 1.38.  She is afebrile.  WBC 5.0, hemoglobin 10.6, platelets 141k.  MRI brain was negative for acute changes.  MRI thoracic/lumbar spine revealed acute compression fractures involving the inferior endplates of Z00 and L1 with up to 25% central height loss, with multifactorial degenerative changes at L4-5 degenerative spondylosis at L5-S1.  She  states her pain is located in central mid back region with some radiation to the anterior abdomen.  Her back pain persists despite narcotic use and brace.  Any movement exacerbates her back pain.  She denies radiation of pain/paresthesias to upper or lower extremities, bowel or bladder difficulties. Request now received for T12/L1 vertebral body augmentation.  Past Medical History:  Diagnosis Date  . Anxiety   . Asthma   . Bipolar disorder ([place])   . Cataracts, bilateral   . Depression   . GERD (gastroesophageal reflux disease)   . Hydatid cyst    Left  . Hypertension   . Kidney problem    RT kidney is smaller -> decreased output  . Migraine   . Osteoporosis 11/2016   T score -2.8  improved from prior DEXA  . Right rotator cuff tear     Past Surgical History:  Procedure Laterality Date  . BUNIONECTOMY     L foot 07/2011 --RT foot 10/2011  . CATARACT  EXTRACTION    . Fimbrioplasty-bilateral-Lysis of adhesions-Excision of Left ovarian cyst  1986  . Hydatid cyst     Left-Adhesions  . ROTATOR CUFF REPAIR     LEFT  . SHOULDER OPEN ROTATOR CUFF REPAIR Right 08/26/2012   Procedure: RIGHT SHOULDER MINI OPEN ROTATOR CUFF REPAIR POSSIBLE PATCH GRAFT AND SUBACROMIAL DECOMPRESSION;  Surgeon: Johnn Hai, MD;  Location: [facility] ORS;  Service: Orthopedics;  Laterality: Right;    Allergies: Divalproex sodium; Geodon [ziprasidone hydrochloride]; Nsaids; and Seroquel [quetiapine fumerate]  Medications: Prior to Admission medications   Medication Sig Start Date End Date Taking? Authorizing Provider  Ascorbic Acid (VITAMIN C PO) Take 1 tablet by mouth daily.   Yes [provider]  aspirin 81 MG tablet Take 325 mg by mouth 3 (three) times a week. Min, Wed, Fri   Yes [provider]  CALCIUM PO Take 1 tablet by mouth daily.   Yes [provider]  cholecalciferol (VITAMIN D) 1000 UNITS tablet Take 1,000 Units by mouth daily.   Yes [provider]  clonazePAM (KLONOPIN) 0.5 MG tablet Take 1 tablet (0.5 mg total) by mouth 3 (three) times daily as needed for anxiety (and insomnia). 04/15/18  Yes Cottle, Billey Co., MD  denosumab (PROLIA) 60 MG/ML SOLN injection Inject 60 mg into the skin every 6 (six) months. Administer in upper arm, thigh, or abdomen   Yes [provider]  fish oil-omega-3 fatty acids 1000 MG capsule Take 1 g by mouth daily.   Yes [provider]  fluticasone (FLONASE) 50 MCG/ACT nasal spray Place 2 sprays into the nose daily.   Yes [provider]  Fluticasone Furoate (ARNUITY ELLIPTA IN) Inhale 1 puff into the lungs at bedtime.    Yes [provider]  Glycerin-Hypromellose-PEG 400 ([employer] DRY EYE RELIEF OP) Apply 1 drop to eye at bedtime as needed (dry eyes).   Yes [provider]  guaiFENesin (MUCINEX PO) Take 1 tablet by mouth at bedtime.   Yes [provider]  IRON PO Take 1 tablet by mouth daily.   Yes [provider]  lamoTRIgine (LAMICTAL) 100 MG tablet TAKE 1 TABLET BY MOUTH IN  THE MORNING AND 2 TABLETS  AT BEDTIME. 06/28/18  Yes Cottle, Billey Co., MD  Multiple Vitamin (MULTIVITAMIN WITH MINERALS) TABS Take 1 tablet by mouth daily.   Yes [provider]  olmesartan (BENICAR) 40 MG tablet Take 40 mg by mouth daily.   Yes [provider]  pantoprazole (PROTONIX) 40 MG tablet Take 40 mg by mouth daily.   Yes [provider]  phenylephrine (SUDAFED PE) 10 MG TABS tablet Take 10 mg by mouth at bedtime.   Yes [provider]  promethazine (PHENERGAN) 25 MG tablet Take 25 mg by mouth every 6 (six) hours as needed. Nausea   Yes [provider]  tiZANidine (ZANAFLEX) 2 MG tablet Take 2 mg by mouth as needed (migraines).  06/28/18  Yes [provider]  topiramate (TOPAMAX) 200 MG tablet Take 200 mg by mouth 2 (two) times daily.    Yes [provider]  traZODone (DESYREL) 100 MG tablet TAKE 3 TABLETS BY MOUTH  EVERY NIGHT AT BEDTIME Patient taking differently: Take 300 mg by mouth at bedtime.  04/19/18  Yes Cottle, Billey Co., MD  zonisamide (ZONEGRAN) 100 MG capsule Take 300 mg by mouth daily.    Yes [provider]  HYDROcodone-acetaminophen (NORCO/VICODIN) 5-325 MG tablet Take 1 tablet by mouth every 6 (six) hours as needed for severe pain. 07/12/18   Carmin Muskrat, MD     Family History  Problem Relation Age of Onset  . Diabetes Father   . Hypertension Father   . Heart disease Father   . Stroke Father   . Cancer Father        Lymphoma  . Hypertension Mother   . Heart disease Mother   . Stroke Mother   . Stroke Paternal Uncle     Social History   Socioeconomic History  . Marital status: Divorced    Spouse name: Not on file  . Number of children: 1  . Years of education: MSN  . Highest education level: Not on file  Occupational History  .  Not on file  Social Needs  . Financial resource strain: Not on file  . Food insecurity:    Worry: Not on file    Inability: Not on file  . Transportation needs:    Medical: Not on file    Non-medical: Not on file  Tobacco Use  . Smoking status: Never Smoker  . Smokeless tobacco: Never Used  Substance and Sexual Activity  . Alcohol use: Yes    Alcohol/week: 0.0 standard drinks    Comment: Rare  . Drug use: No  . Sexual activity: Not Currently    Birth control/protection: Post-menopausal    Comment: 1st intercourse 34 yo-2 partners  Lifestyle  . Physical activity:    Days per week: Not on file    Minutes per session: Not  on file  . Stress: Not on file  Relationships  . Social connections:    Talks on phone: Not on file    Gets together: Not on file    Attends religious service: Not on file    Active member of club or organization: Not on file    Attends meetings of clubs or organizations: Not on file    Relationship status: Not on file  Other Topics Concern  . Not on file  Social History Narrative   Lives with husband and daughter   Caffeine use: 2 cups per day   Right handed     Review of Systems see above; denies fever, chest pain, dyspnea, cough, nausea, vomiting or bleeding.  She does have occasional headaches as well.  Vital Signs: BP (!) 145/76 (BP Location: Left Arm)   Pulse (!) 58   Temp 97.9 F (36.6 C) (Oral)   Resp 18   Ht 5' 5.5" (1.664 m)   Wt 149 lb 5 oz (67.7 kg)   SpO2 97%   BMI 24.47 kg/m   Physical Exam awake, alert.  Chest with slightly diminished breath sounds bases.  Heart with regular rate and rhythm.  Abdomen soft, positive bowel sounds, currently nontender.  Moderate paravertebral tenderness T12/L1 region.  No lower extremity edema.  Imaging: Dg Thoracic Spine 4v  Result Date: 07/12/2018 CLINICAL DATA:  [place]. Pain. EXAM: LUMBAR SPINE - COMPLETE 4+ VIEW; THORACIC SPINE - 4+ VIEW COMPARISON:  None. FINDINGS: Alignment is normal. On the  lateral radiographs, there is concern for slight wedging of T12, inferior endplate, suspicious for fracture. In addition, on the lateral radiograph, there is inferior endplate irregularity at L1, also concerning for fracture. Recommend CT of the lumbar spine to include the lower thoracic region up to T10 for confirmation. Lower lumbar facet arthropathy. Disc space narrowing at L4-5 and L5-S1. No rib fracture. No lung pathology. Pedicles are intact. IMPRESSION: Suspicion for mild T12 and L1 compression fractures. Recommend CT of the lumbar spine to include the lower thoracic region for confirmation. Electronically Signed   By: Staci Righter M.D.   On: 07/12/2018 12:25   Dg Lumbar Spine Complete  Result Date: 07/12/2018 CLINICAL DATA:  [place]. Pain. EXAM: LUMBAR SPINE - COMPLETE 4+ VIEW; THORACIC SPINE - 4+ VIEW COMPARISON:  None. FINDINGS: Alignment is normal. On the lateral radiographs, there is concern for slight wedging of T12, inferior endplate, suspicious for fracture. In addition, on the lateral radiograph, there is inferior endplate irregularity at L1, also concerning for fracture. Recommend CT of the lumbar spine to include the lower thoracic region up to T10 for confirmation. Lower lumbar facet arthropathy. Disc space narrowing at L4-5 and L5-S1. No rib fracture. No lung pathology. Pedicles are intact. IMPRESSION: Suspicion for mild T12 and L1 compression fractures. Recommend CT of the lumbar spine to include the lower thoracic region for confirmation. Electronically Signed   By: Staci Righter M.D.   On: 07/12/2018 12:25   Ct Lumbar Spine Wo Contrast  Result Date: 07/12/2018 CLINICAL DATA:  Syncope and fall. Low back pain. Thoracolumbar compression fractures on radiographs. EXAM: CT LUMBAR SPINE WITHOUT CONTRAST TECHNIQUE: Multidetector CT imaging of the lumbar spine was performed without intravenous contrast administration. Multiplanar CT image reconstructions were also generated. COMPARISON:  Thoracic  and lumbar spine radiographs 07/12/2018 FINDINGS: Segmentation: 5 lumbar type vertebrae. Alignment: Normal. Vertebrae: Acute appearing inferior endplate compression fractures are present at T12 and L1 with 10-15% height loss. The L1 fracture involves the posterior  vertebral body cortex with 3 mm of retropulsion. No convincing posterior element fracture is identified. No suspicious osseous lesion is seen. Bridging osteophytes are noted across the anterosuperior aspects of the SI joints bilaterally. Paraspinal and other soft tissues: Marked asymmetric right renal atrophy. Disc levels: Lower lumbar disc degeneration with moderate disc space narrowing, degenerative endplate sclerosis and spurring, and vacuum disc at L4-5 and L5-S1. Mild spinal stenosis and mild-to-moderate bilateral neural foraminal stenosis at L4-5 due to disc bulging and mild posterior element hypertrophy. Mild bilateral neural foraminal stenosis at L5-S1. IMPRESSION: 1. Acute T12 and L1 compression fractures. 3 mm retropulsion at L1 without significant stenosis. 2. Lower lumbar disc and facet degeneration, most notable at L4-5 where there is mild spinal and mild-to-moderate bilateral neural foraminal stenosis. Electronically Signed   By: Logan Bores M.D.   On: 07/12/2018 15:45   Mr Brain Wo Contrast  Result Date: 07/12/2018 CLINICAL DATA:  Multiple syncopal episodes.  Fall. EXAM: MRI HEAD WITHOUT CONTRAST TECHNIQUE: Multiplanar, multiecho pulse sequences of the brain and surrounding structures were obtained without intravenous contrast. COMPARISON:  None. FINDINGS: Brain: The diffusion-weighted images demonstrate no evidence for acute or subacute infarction. Study is mildly degraded by patient motion. No significant white matter lesions are present. The ventricles are of normal size. No significant extraaxial fluid collection is present. The internal auditory canals are within normal limits. The brainstem and cerebellum are within normal limits.  Vascular: Flow is present in the major intracranial arteries. Skull and upper cervical spine: The craniocervical junction is normal. Upper cervical spine is within normal limits. Marrow signal is unremarkable. Sinuses/Orbits: The paranasal sinuses and mastoid air cells are clear. Right lens replacement is present. Globes and orbits are otherwise normal. IMPRESSION: 1. Normal MRI appearance of the brain for age. No acute or focal lesion to explain syncopal episodes. Electronically Signed   By: San Morelle M.D.   On: 07/12/2018 21:06   Mr Thoracic Spine Wo Contrast  Result Date: 07/12/2018 CLINICAL DATA:  Initial evaluation for acute vertebral body fractures. EXAM: MRI THORACIC AND LUMBAR SPINE WITHOUT CONTRAST TECHNIQUE: Multiplanar and multiecho pulse sequences of the thoracic and lumbar spine were obtained without intravenous contrast. COMPARISON:  Prior CT from earlier the same day. FINDINGS: MRI THORACIC SPINE FINDINGS Alignment: Vertebral bodies normally aligned with preservation of the normal thoracic kyphosis. No listhesis. Vertebrae: Acute compression fractures involving the inferior endplates of V95 and L1, described on corresponding lumbar spine portion of this exam. Vertebral body heights otherwise maintained without evidence for acute or chronic fracture. Underlying bone marrow signal intensity within normal limits. Few small benign hemangiomas noted, most prominent of which in the T11 vertebral body. No worrisome osseous lesions. No other abnormal marrow edema. Cord:  Signal intensity within the thoracic spinal cord is normal. Paraspinal and other soft tissues: Paraspinous soft tissues demonstrate no acute finding. Small layering bilateral pleural effusions noted. Cholelithiasis. Asymmetric right renal atrophy noted. Disc levels: No significant disc pathology seen within the thoracic spine. No canal or neural foraminal stenosis. MRI LUMBAR SPINE FINDINGS Segmentation: Standard. Lowest  well-formed disc labeled the L5-S1 level. Alignment: Trace 2 mm retrolisthesis of L5 on S1. Alignment otherwise normal with preservation of the normal lumbar lordosis. Vertebrae: Acute compression fracture extends through the inferior endplate of G38. Associated mild central height loss of up to 25% without bony retropulsion. Additional acute compression fracture extends through the inferior endplate of L1 with up to 25% central height loss with minimal 3 mm bony retropulsion. These  are benign/mechanical in appearance. Vertebral body heights otherwise maintained without evidence for acute or chronic fracture. Underlying bone marrow signal intensity within normal limits. Few small benign hemangiomas noted. No worrisome osseous lesions. Reactive endplate changes noted about the L4-5 and L5-S1 interspaces. Mild edema about the left L3-4 facet due to facet arthritis. No other abnormal marrow edema. Conus medullaris and cauda equina: Conus extends to the L2 level. Conus and cauda equina appear normal. Paraspinal and other soft tissues: Paraspinous soft tissues demonstrate no acute finding. No other vessel oral abnormality. Disc levels: L1-2: Trace 3 mm bony retropulsion related to the L1 compression fracture. Minimal flattening of the ventral thecal sac without stenosis. Foramina remain patent. L2-3: Disc desiccation without significant disc bulge. No stenosis or impingement. L3-4: Minimal annular disc bulge with disc desiccation. Mild facet and ligament flavum hypertrophy. No significant canal or foraminal stenosis. L4-5: Chronic intervertebral disc space narrowing with diffuse disc bulge and disc desiccation. Associated reactive endplate changes. Mild facet and ligament flavum hypertrophy. Resultant moderate canal with bilateral subarticular stenosis. Mild left with mild to moderate right L4 foraminal narrowing. L5-S1: Trace retrolisthesis. Diffuse degenerative disc bulge with disc desiccation and intervertebral disc  space narrowing. Chronic reactive endplate changes with marginal endplate osteophytic spurring. Mild bilateral facet hypertrophy. No significant canal or lateral recess stenosis. Mild bilateral L5 foraminal narrowing. IMPRESSION: 1. Acute compression fractures involving the inferior endplates of I95 and L1 with up to 25% central height loss. Trace 3 mm bony retropulsion at L1 without associated stenosis. No significant bony retropulsion or stenosis at T12. 2. Multifactorial degenerative changes at L4-5 with resultant moderate canal with bilateral subarticular stenosis, with mild to moderate right greater than left L4 foraminal narrowing. 3. Degenerative spondylolysis at L5-S1 with resultant mild bilateral L5 foraminal stenosis. 4. Small bilateral pleural effusions. Electronically Signed   By: Jeannine Boga M.D.   On: 07/12/2018 21:47   Mr Lumbar Spine Wo Contrast  Result Date: 07/12/2018 CLINICAL DATA:  Initial evaluation for acute vertebral body fractures. EXAM: MRI THORACIC AND LUMBAR SPINE WITHOUT CONTRAST TECHNIQUE: Multiplanar and multiecho pulse sequences of the thoracic and lumbar spine were obtained without intravenous contrast. COMPARISON:  Prior CT from earlier the same day. FINDINGS: MRI THORACIC SPINE FINDINGS Alignment: Vertebral bodies normally aligned with preservation of the normal thoracic kyphosis. No listhesis. Vertebrae: Acute compression fractures involving the inferior endplates of J88 and L1, described on corresponding lumbar spine portion of this exam. Vertebral body heights otherwise maintained without evidence for acute or chronic fracture. Underlying bone marrow signal intensity within normal limits. Few small benign hemangiomas noted, most prominent of which in the T11 vertebral body. No worrisome osseous lesions. No other abnormal marrow edema. Cord:  Signal intensity within the thoracic spinal cord is normal. Paraspinal and other soft tissues: Paraspinous soft tissues  demonstrate no acute finding. Small layering bilateral pleural effusions noted. Cholelithiasis. Asymmetric right renal atrophy noted. Disc levels: No significant disc pathology seen within the thoracic spine. No canal or neural foraminal stenosis. MRI LUMBAR SPINE FINDINGS Segmentation: Standard. Lowest well-formed disc labeled the L5-S1 level. Alignment: Trace 2 mm retrolisthesis of L5 on S1. Alignment otherwise normal with preservation of the normal lumbar lordosis. Vertebrae: Acute compression fracture extends through the inferior endplate of C16. Associated mild central height loss of up to 25% without bony retropulsion. Additional acute compression fracture extends through the inferior endplate of L1 with up to 25% central height loss with minimal 3 mm bony retropulsion. These are benign/mechanical in appearance.  Vertebral body heights otherwise maintained without evidence for acute or chronic fracture. Underlying bone marrow signal intensity within normal limits. Few small benign hemangiomas noted. No worrisome osseous lesions. Reactive endplate changes noted about the L4-5 and L5-S1 interspaces. Mild edema about the left L3-4 facet due to facet arthritis. No other abnormal marrow edema. Conus medullaris and cauda equina: Conus extends to the L2 level. Conus and cauda equina appear normal. Paraspinal and other soft tissues: Paraspinous soft tissues demonstrate no acute finding. No other vessel oral abnormality. Disc levels: L1-2: Trace 3 mm bony retropulsion related to the L1 compression fracture. Minimal flattening of the ventral thecal sac without stenosis. Foramina remain patent. L2-3: Disc desiccation without significant disc bulge. No stenosis or impingement. L3-4: Minimal annular disc bulge with disc desiccation. Mild facet and ligament flavum hypertrophy. No significant canal or foraminal stenosis. L4-5: Chronic intervertebral disc space narrowing with diffuse disc bulge and disc desiccation. Associated  reactive endplate changes. Mild facet and ligament flavum hypertrophy. Resultant moderate canal with bilateral subarticular stenosis. Mild left with mild to moderate right L4 foraminal narrowing. L5-S1: Trace retrolisthesis. Diffuse degenerative disc bulge with disc desiccation and intervertebral disc space narrowing. Chronic reactive endplate changes with marginal endplate osteophytic spurring. Mild bilateral facet hypertrophy. No significant canal or lateral recess stenosis. Mild bilateral L5 foraminal narrowing. IMPRESSION: 1. Acute compression fractures involving the inferior endplates of Z61 and L1 with up to 25% central height loss. Trace 3 mm bony retropulsion at L1 without associated stenosis. No significant bony retropulsion or stenosis at T12. 2. Multifactorial degenerative changes at L4-5 with resultant moderate canal with bilateral subarticular stenosis, with mild to moderate right greater than left L4 foraminal narrowing. 3. Degenerative spondylolysis at L5-S1 with resultant mild bilateral L5 foraminal stenosis. 4. Small bilateral pleural effusions. Electronically Signed   By: Jeannine Boga M.D.   On: 07/12/2018 21:47    Labs:  CBC: Recent Labs    07/12/18 1107 07/13/18 0610  WBC 9.6 5.0  HGB 12.1 10.6*  HCT 40.2 35.4*  PLT 166 141*    COAGS: No results for input(s): INR, APTT in the last 8760 hours.  BMP: Recent Labs    07/12/18 1107 07/13/18 0610  NA 140 140  K 4.2 3.3*  CL 113* 113*  CO2 22 21*  GLUCOSE 132* 103*  BUN 29* 19  CALCIUM 9.6 8.8*  CREATININE 1.63* 1.38*  GFRNONAA 32* 39*  GFRAA 37* 46*    LIVER FUNCTION TESTS: Recent Labs    07/13/18 0610  BILITOT 0.5  AST 21  ALT 20  ALKPHOS 61  PROT 6.0*  ALBUMIN 3.6    TUMOR MARKERS: No results for input(s): AFPTM, CEA, CA199, CHROMGRNA in the last 8760 hours.  Assessment and Plan: 68 y.o. female with history of bipolar disorder, depression, GERD, hypertension, migraines and osteoporosis who  was recently admitted to [facility] following fall and syncopal episode at home with subsequent back pain.  Etiology of syncope felt most likely to be from hypotension and volume depletion.  Creatinine was elevated on admission and currently 1.38.  She is afebrile.  WBC 5.0, hemoglobin 10.6, platelets 141k.  MRI brain was negative for acute changes.  MRI thoracic/lumbar spine revealed acute compression fractures involving the inferior endplates of W96 and L1 with up to 25% central height loss, with multifactorial degenerative changes at L4-5 degenerative spondylosis at L5-S1.  She  states her pain is located in central mid back region with some radiation to the anterior abdomen. Her  back pain persists despite narcotic use and brace.  Any movement exacerbates her back pain.  She denies radiation of pain/paresthesias to upper or lower extremities, bowel or bladder difficulties. Request now received for T12/L1 vertebral body augmentation.  Imaging studies have been reviewed by Dr. Barbie Banner.Risks and benefits of procedure were discussed with the patient including, but not limited to education regarding the natural healing process of compression fractures without intervention, bleeding, infection, cement migration which may cause spinal cord damage, paralysis, pulmonary embolism or even death.  This interventional procedure involves the use of X-rays and because of the nature of the planned procedure, it is possible that we will have prolonged use of X-ray fluoroscopy.  Potential radiation risks to you include (but are not limited to) the following: - A slightly elevated risk for cancer  several years later in life. This risk is typically less than 0.5% percent. This risk is low in comparison to the normal incidence of human cancer, which is 33% for women and 50% for men according to the [place]. - Radiation induced injury can include skin redness, resembling a rash, tissue breakdown / ulcers  and hair loss (which can be temporary or permanent).   The likelihood of either of these occurring depends on the difficulty of the procedure and whether you are sensitive to radiation due to previous procedures, disease, or genetic conditions.   IF your procedure requires a prolonged use of radiation, you will be notified and given written instructions for further action.  It is your responsibility to monitor the irradiated area for the 2 weeks following the procedure and to notify your physician if you are concerned that you have suffered a radiation induced injury.    All of the patient's questions were answered, patient is agreeable to proceed.  Consent signed and in chart.  Procedure tentatively planned for 2/5.  Will need to hold Lovenox until after above procedure.    Thank you for this interesting consult.  I greatly enjoyed meeting Khiana Camino Edison and look forward to participating in their care.  A copy of this report was sent to the requesting provider on this date.  Electronically Signed: D. Rowe Robert, PA-C 07/13/2018, 10:26 AM   I spent a total of 40 minutes in face to face in clinical consultation, greater than 50% of which was counseling/coordinating care for T12/L1 vertebral body augmentation/kyphoplasty

## 2018-07-13 NOTE — Progress Notes (Signed)
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 Significant psychiatric history Headaches Presented to [facility] long emergency room with 3 falls starting at 4 AM this morning Awoke to get some juice from the fridge had assisted fall while holding onto the door subsequently had 2 other falls resulting in the third fall when she was at her refrigerator door at around 415 and lost consciousness until about 425-she did not have incontinence of urine or stool at the time no tongue biting was reported however there were no witnesses She has had this episode a couple of times in the past year No new medications  Admitted for Lower back fractures in a setting of syncope   A & Plan Fall with syncope Work-up unrevealing-tele personally reviewed and no arrythmia only 1 deg AVB--d/c tele MRI brain neg Very unlikely sz--hold EEG Suspect 2/2 volume depletion-orthostatics neg AKI Baseline creatinine is less than 1.4 here she is 29/1.6 cont saline 75 cc  Improved Holding ARB from now on and wouldn't resume on d/c Compression fracture acute T12-L1 Continue TLSO brace while awake Dilaudid to 1 mg every 2 hours for severe pain and use 2 tablets of Percocet every 3 as needed for moderate pain note that she may have some opiate tolerance Migraines Continuing Topamax 200 twice daily, Zonegran 300 daily Bipolar- Gets her meds from psychiatrist continue lamotrigine 100 a.m. 200 p.m. Klonopin 0.5 3 times daily as needed, trazodone 300 at bedtime Chronic back pains Related to her work-continue tizanidine 2 mg Hypokalemia Replacing orally Osteoporosis Will need testing as an outpatient with DEXA note that she is on Prolia and calcium  DVT loveneox  Code Status: full  Communication:  No family   Disposition Plan: inpatient    Verlon Au, MD  [facility] Via [employer] app OR [email or web address] 7PM-7AM contact night coverage as above 07/13/2018, 9:40 AM  LOS: 1 day   Consultants:  ir  Procedures:  multiple  Antimicrobials:  n  Interval history/Subjective: Awake alert 7/120 pain Has been worse with moving tol breakfast No cp No fever  Objective:  Vitals:  Vitals:   07/13/18 0819 07/13/18 0822  BP:    Pulse:    Resp:    Temp:    SpO2: 97% 97%    Exam:   eomi ncat no ict cta b no added sound no rales Neck soft supple-mouth no lesions Nose wnl s1 s2 no m--reviewed tele--only 1deg avb abd soft no mass no rebound no organomeg No le edema No tremor Power on R leg limited by [pain other wsie is 5/5 reflexes deferred Psych is euthymic    I have personally reviewed the following:  DATA   Labs:  k 3.3  bunn /creat down from admit 29/1.6--19/1.3  Imaging studies: Mri brain 1. Normal MRI appearance of the brain for age. No acute or focal lesion to explain syncopal episodes. MRI back 1. Acute compression fractures involving the inferior endplates of W23 and L1 with up to 25% central height loss. Trace 3 mm bony retropulsion at L1 without associated stenosis. No significant bony retropulsion or stenosis at T12. 2. Multifactorial degenerative changes at L4-5 with resultant moderate canal with bilateral subarticular stenosis, with mild to moderate right greater than left L4 foraminal narrowing. 3. Degenerative spondylolysis at L5-S1 with resultant mild bilateral L5 foraminal stenosis. 4. Small bilateral pleural effusions.  Scheduled Meds: . aspirin EC  325 mg Oral Once per day on Mon Wed Fri  . budesonide (PULMICORT) nebulizer solution  0.5 mg Nebulization BID  . enoxaparin (LOVENOX) injection  40 mg Subcutaneous Q24H  . ferrous sulfate   Oral Daily  . lamoTRIgine  100 mg Oral Daily  . lamoTRIgine  200 mg Oral QHS  . pantoprazole  40 mg Oral Daily  . potassium chloride  40 mEq Oral Daily  .  topiramate  200 mg Oral BID  . traZODone  300 mg Oral QHS  . zonisamide  300 mg Oral Daily   Continuous Infusions: . sodium chloride 75 mL/hr at 07/13/18 3685    Active Problems:   Fall   LOS: 1 day

## 2018-07-14 ENCOUNTER — Inpatient Hospital Stay (HOSPITAL_COMMUNITY): Payer: Medicare Other

## 2018-07-14 ENCOUNTER — Encounter (HOSPITAL_COMMUNITY): Payer: Self-pay | Admitting: Interventional Radiology

## 2018-07-14 DIAGNOSIS — W19XXXD Unspecified fall, subsequent encounter: Secondary | ICD-10-CM

## 2018-07-14 HISTORY — PX: IR KYPHO LUMBAR INC FX REDUCE BONE BX UNI/BIL CANNULATION INC/IMAGING: IMG5519

## 2018-07-14 HISTORY — PX: IR KYPHO EA ADDL LEVEL THORACIC OR LUMBAR: IMG5520

## 2018-07-14 LAB — RENAL FUNCTION PANEL
Albumin: 3.3 g/dL — ABNORMAL LOW (ref 3.5–5.0)
Anion gap: 5 (ref 5–15)
BUN: 17 mg/dL (ref 8–23)
CO2: 20 mmol/L — ABNORMAL LOW (ref 22–32)
Calcium: 9 mg/dL (ref 8.9–10.3)
Chloride: 117 mmol/L — ABNORMAL HIGH (ref 98–111)
Creatinine, Ser: 1.34 mg/dL — ABNORMAL HIGH (ref 0.44–1.00)
GFR calc Af Amer: 47 mL/min — ABNORMAL LOW (ref >60)
GFR calc non Af Amer: 41 mL/min — ABNORMAL LOW (ref >60)
GLUCOSE: 101 mg/dL — AB (ref 70–99)
Phosphorus: 3.5 mg/dL (ref 2.5–4.6)
Potassium: 3.7 mmol/L (ref 3.5–5.1)
Sodium: 142 mmol/L (ref 135–145)

## 2018-07-14 LAB — CBC WITH DIFFERENTIAL/PLATELET
Abs Immature Granulocytes: 0.03 10*3/uL (ref 0.00–0.07)
Basophils Absolute: 0 10*3/uL (ref 0.0–0.1)
Basophils Relative: 1 %
EOS PCT: 4 %
Eosinophils Absolute: 0.2 10*3/uL (ref 0.0–0.5)
HCT: 34.4 % — ABNORMAL LOW (ref 36.0–46.0)
Hemoglobin: 10.4 g/dL — ABNORMAL LOW (ref 12.0–15.0)
Immature Granulocytes: 1 %
Lymphocytes Relative: 28 %
Lymphs Abs: 1.6 10*3/uL (ref 0.7–4.0)
MCH: 29.6 pg (ref 26.0–34.0)
MCHC: 30.2 g/dL (ref 30.0–36.0)
MCV: 98 fL (ref 80.0–100.0)
MONOS PCT: 7 %
Monocytes Absolute: 0.4 10*3/uL (ref 0.1–1.0)
Neutro Abs: 3.3 10*3/uL (ref 1.7–7.7)
Neutrophils Relative %: 59 %
Platelets: 142 10*3/uL — ABNORMAL LOW (ref 150–400)
RBC: 3.51 MIL/uL — ABNORMAL LOW (ref 3.87–5.11)
RDW: 11.9 % (ref 11.5–15.5)
WBC: 5.5 10*3/uL (ref 4.0–10.5)
nRBC: 0 % (ref 0.0–0.2)

## 2018-07-14 MED ORDER — FENTANYL CITRATE (PF) 100 MCG/2ML IJ SOLN
INTRAMUSCULAR | Status: AC
  Filled 2018-07-14: qty 4

## 2018-07-14 MED ORDER — DOCUSATE SODIUM 100 MG PO CAPS
200.0000 mg | ORAL_CAPSULE | Freq: Once | ORAL | Status: AC
  Administered 2018-07-14: 200 mg via ORAL
  Filled 2018-07-14: qty 2

## 2018-07-14 MED ORDER — DOCUSATE SODIUM 100 MG PO CAPS
100.0000 mg | ORAL_CAPSULE | Freq: Two times a day (BID) | ORAL | Status: DC
  Administered 2018-07-15: 100 mg via ORAL
  Filled 2018-07-14: qty 1

## 2018-07-14 MED ORDER — LIDOCAINE HCL (PF) 1 % IJ SOLN
INTRAMUSCULAR | Status: AC | PRN
  Administered 2018-07-14: 15 mL

## 2018-07-14 MED ORDER — HYDRALAZINE HCL 20 MG/ML IJ SOLN: 10.0000 mg | Freq: Four times a day (QID) | INTRAMUSCULAR | Status: DC | PRN

## 2018-07-14 MED ORDER — MIDAZOLAM HCL 2 MG/2ML IJ SOLN
INTRAMUSCULAR | Status: AC | PRN
  Administered 2018-07-14 (×2): 1 mg via INTRAVENOUS
  Administered 2018-07-14: 0.5 mg via INTRAVENOUS
  Administered 2018-07-14 (×3): 1 mg via INTRAVENOUS

## 2018-07-14 MED ORDER — IOPAMIDOL (ISOVUE-300) INJECTION 61%
INTRAVENOUS | Status: AC
  Administered 2018-07-14: 20 mL
  Filled 2018-07-14: qty 50

## 2018-07-14 MED ORDER — MIDAZOLAM HCL 2 MG/2ML IJ SOLN
INTRAMUSCULAR | Status: AC
  Filled 2018-07-14: qty 6

## 2018-07-14 MED ORDER — LIDOCAINE HCL (PF) 1 % IJ SOLN
INTRAMUSCULAR | Status: AC
  Filled 2018-07-14: qty 30

## 2018-07-14 MED ORDER — SODIUM CHLORIDE 0.9 % IV SOLN: INTRAVENOUS | Status: AC

## 2018-07-14 MED ORDER — FENTANYL CITRATE (PF) 100 MCG/2ML IJ SOLN
INTRAMUSCULAR | Status: AC | PRN
  Administered 2018-07-14 (×4): 50 ug via INTRAVENOUS

## 2018-07-14 MED ORDER — IOPAMIDOL (ISOVUE-300) INJECTION 61%
50.0000 mL | Freq: Once | INTRAVENOUS | Status: AC | PRN
  Administered 2018-07-14: 20 mL

## 2018-07-14 MED ORDER — CEFAZOLIN SODIUM-DEXTROSE 2-4 GM/100ML-% IV SOLN
INTRAVENOUS | Status: AC
  Administered 2018-07-14: 2 g via INTRAVENOUS
  Filled 2018-07-14: qty 100

## 2018-07-14 NOTE — Discharge Instructions (Addendum)
Moderate Conscious Sedation, Adult, Care After These instructions provide you with information about caring for yourself after your procedure. Your health care provider may also give you more specific instructions. Your treatment has been planned according to current medical practices, but problems sometimes occur. Call your health care provider if you have any problems or questions after your procedure. What can I expect after the procedure? After your procedure, it is common:  To feel sleepy for several hours.  To feel clumsy and have poor balance for several hours.  To have poor judgment for several hours.  To vomit if you eat too soon. Follow these instructions at home: For at least 24 hours after the procedure:   Do not: ? Participate in activities where you could fall or become injured. ? Drive. ? Use heavy machinery. ? Drink alcohol. ? Take sleeping pills or medicines that cause drowsiness. ? Make important decisions or sign legal documents. ? Take care of children on your own.  Rest. Eating and drinking  Follow the diet recommended by your health care provider.  If you vomit: ? Drink water, juice, or soup when you can drink without vomiting. ? Make sure you have little or no nausea before eating solid foods. General instructions  Have a responsible adult stay with you until you are awake and alert.  Take over-the-counter and prescription medicines only as told by your health care provider.  If you smoke, do not smoke without supervision.  Keep all follow-up visits as told by your health care provider. This is important. Contact a health care provider if:  You keep feeling nauseous or you keep vomiting.  You feel light-headed.  You develop a rash.  You have a fever. Get help right away if:  You have trouble breathing. This information is not intended to replace advice given to you by your health care provider. Make sure you discuss any questions you have  with your health care provider. Document Released: 03/16/2013 Document Revised: 10/29/2015 Document Reviewed: 09/15/2015 Elsevier Interactive Patient Education  [employer].    As discussed, with today's events, including your losing consciousness, and you are sustaining compression fractures of your spine it is important that you follow-up with both your primary care physician and our orthopedic colleagues.  Be sure to call tomorrow for follow-up care.  Please use the provided brace when awake.  Return here for concerning changes in your condition.   Additional discharge instructions  Please get your medications reviewed and adjusted by your Primary MD.  Please request your Primary MD to go over all Hospital Tests and Procedure/Radiological results at the follow up, please get all Hospital records sent to your Prim MD by signing hospital release before you go home.  If you had Pneumonia of Lung problems at the Hospital: Please get a 2 view Chest X ray done in 6-8 weeks after hospital discharge or sooner if instructed by your Primary MD.  If you have Congestive Heart Failure: Please call your Cardiologist or Primary MD anytime you have any of the following symptoms:  1) 3 pound weight gain in 24 hours or 5 pounds in 1 week  2) shortness of breath, with or without a dry hacking cough  3) swelling in the hands, feet or stomach  4) if you have to sleep on extra pillows at night in order to breathe  Follow cardiac low salt diet and 1.5 lit/day fluid restriction.  If you have diabetes Accuchecks 4 times/day, Once in AM empty  stomach and then before each meal. Log in all results and show them to your primary doctor at your next visit. If any glucose reading is under 80 or above 300 call your primary MD immediately.  If you have Seizure/Convulsions/Epilepsy: Please do not drive, operate heavy machinery, participate in activities at heights or participate in high speed sports  until you have seen by Primary MD or a Neurologist and advised to do so again.  If you had Gastrointestinal Bleeding: Please ask your Primary MD to check a complete blood count within one week of discharge or at your next visit. Your endoscopic/colonoscopic biopsies that are pending at the time of discharge, will also need to followed by your Primary MD.  Get Medicines reviewed and adjusted. Please take all your medications with you for your next visit with your Primary MD  Please request your Primary MD to go over all hospital tests and procedure/radiological results at the follow up, please ask your Primary MD to get all Hospital records sent to his/her office.  If you experience worsening of your admission symptoms, develop shortness of breath, life threatening emergency, suicidal or homicidal thoughts you must seek medical attention immediately by calling 911 or calling your MD immediately  if symptoms less severe.  You must read complete instructions/literature along with all the possible adverse reactions/side effects for all the Medicines you take and that have been prescribed to you. Take any new Medicines after you have completely understood and accpet all the possible adverse reactions/side effects.   Do not drive or operate heavy machinery when taking Pain medications.   Do not take more than prescribed Pain, Sleep and Anxiety Medications  Special Instructions: If you have smoked or chewed Tobacco  in the last 2 yrs please stop smoking, stop any regular Alcohol  and or any Recreational drug use.  Wear Seat belts while driving.  Please note You were cared for by a hospitalist during your hospital stay. If you have any questions about your discharge medications or the care you received while you were in the hospital after you are discharged, you can call the unit and asked to speak with the hospitalist on call if the hospitalist that took care of you is not available. Once you are  discharged, your primary care physician will handle any further medical issues. Please note that NO REFILLS for any discharge medications will be authorized once you are discharged, as it is imperative that you return to your primary care physician (or establish a relationship with a primary care physician if you do not have one) for your aftercare needs so that they can reassess your need for medications and monitor your lab values.  You can reach the hospitalist office at phone [phone] or fax [phone]   If you do not have a primary care physician, you can call [phone] for a physician referral.

## 2018-07-14 NOTE — Progress Notes (Addendum)
PROGRESS NOTE   Kelli Morales  VQQ:[id number]    DOB: [birth year]    DOA: 07/12/2018  PCP: Harlan Stains, MD   I have briefly reviewed patients previous medical records in [facility].  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  She had associated back pain evaluation of which revealed acute compression fracture of T12-L1.  IR consulted and patient underwent kyphoplasty 2/5.  Assessment & Plan:   Active Problems:   Fall   1. Syncope and falls: Etiology of syncope unclear.  Telemetry without arrhythmias.  MRI brain negative.  Seizures felt less likely.  Dehydration suspected but orthostatics negative.  Patient reports that she has had prior syncopal episodes.  Do not see an echo in system, ordered.  Patient has been counseled that she should not drive for 6 months, not happy but verbalized understanding. ?  From polypharmacy- may need psych follow-up to minimize if possible. 2. Acute low back pain/T12-L1 fracture: IR was consulted and patient underwent kyphoplasty on 2/5.  Pain management and PT and OT evaluation. 3. Acute kidney injury: Presented with creatinine of 1.63 which is improved to 1.34.  Follow BMP in a.m.  Avoid nephrotoxic's.  ARB on hold. 4. Essential hypertension: Mildly uncontrolled at times.  ARB held.  Add PRN hydralazine.   5. Normocytic anemia: Unclear etiology.  Stable.  Outpatient follow-up and evaluation. 6. Mild thrombocytopenia: Unclear etiology.  No bleeding reported.  Follow CBCs. 7. Migraine: Continuing Topamax and Zonegran. 8. Bipolar disorder: Reportedly follows with psychiatry, continue lamotrigine, Klonopin and trazodone. 9. History of chronic back pains: Continue tizanidine. 10. Hypokalemia: Replaced. 11. Osteoporosis:  Will need outpatient DEXA scan.  Patient on Prolia and calcium.     DVT prophylaxis: Lovenox Code Status: DNR Family Communication: None at bedside Disposition: DC home pending clinical improvement   Consultants:  IR  Procedures:  T12 and L1 kyphoplasty by IR on 2/5.  Antimicrobials:  None.   Subjective: Patient seen this morning prior to procedure.  Ongoing low back pain.  Denied any other complaints.  Denied lower extremity weakness, numbness or tingling or sphincter disturbances.  ROS: As above, otherwise negative.  Objective:  Vitals:   07/14/18 1600 07/14/18 1605 07/14/18 1610 07/14/18 1612  BP: 122/81 125/80 (!) 109/92 (!) 123/109  Pulse: 75 77 76 74  Resp: (!) 9 13 14 13   Temp:      TempSrc:      SpO2: 98% 97% 98% 99%  Weight:      Height:        Examination:  General exam: Pleasant middle-aged female, moderately built and nourished lying comfortably propped up in bed without distress. Respiratory system: Clear to auscultation. Respiratory effort normal. Cardiovascular system: S1 & S2 heard, RRR. No JVD, murmurs, rubs, gallops or clicks. No pedal edema. Gastrointestinal system: Abdomen is nondistended, soft and nontender. No organomegaly or masses felt. Normal bowel sounds heard. Central nervous system: Alert and oriented. No focal neurological deficits. Extremities: Symmetric 5 x 5 power. Skin: No rashes, lesions or ulcers Psychiatry: Judgement and insight appear normal. Mood & affect appropriate.     Data Reviewed: I have personally reviewed following labs and imaging studies  CBC: Recent Labs  Lab 07/12/18 1107 07/13/18 0610 07/14/18 0653  WBC 9.6 5.0 5.5  NEUTROABS  --   --  3.3  HGB 12.1 10.6* 10.4*  HCT 40.2 35.4* 34.4*  MCV 97.6 98.9 98.0  PLT 166 141* 338*   Basic Metabolic Panel: Recent Labs  Lab 07/12/18 1107 07/13/18 0610 07/14/18 0653  NA 140 140 142  K 4.2 3.3* 3.7  CL 113* 113* 117*  CO2 22 21* 20*  GLUCOSE 132* 103*  101*  BUN 29* 19 17  CREATININE 1.63* 1.38* 1.34*  CALCIUM 9.6 8.8* 9.0  PHOS  --   --  3.5   Liver Function Tests: Recent Labs  Lab 07/13/18 0610 07/14/18 0653  AST 21  --   ALT 20  --   ALKPHOS 61  --   BILITOT 0.5  --   PROT 6.0*  --   ALBUMIN 3.6 3.3*   Coagulation Profile: Recent Labs  Lab 07/13/18 1012  INR 1.05   Cardiac Enzymes: Recent Labs  Lab 07/12/18 1107  TROPONINI <0.03   HbA1C: No results for input(s): HGBA1C in the last 72 hours. CBG: Recent Labs  Lab 07/12/18 1106  GLUCAP 111*    No results found for this or any previous visit (from the past 240 hour(s)).       Radiology Studies: Mr Brain Wo Contrast  Result Date: 07/12/2018 CLINICAL DATA:  Multiple syncopal episodes.  Fall. EXAM: MRI HEAD WITHOUT CONTRAST TECHNIQUE: Multiplanar, multiecho pulse sequences of the brain and surrounding structures were obtained without intravenous contrast. COMPARISON:  None. FINDINGS: Brain: The diffusion-weighted images demonstrate no evidence for acute or subacute infarction. Study is mildly degraded by patient motion. No significant white matter lesions are present. The ventricles are of normal size. No significant extraaxial fluid collection is present. The internal auditory canals are within normal limits. The brainstem and cerebellum are within normal limits. Vascular: Flow is present in the major intracranial arteries. Skull and upper cervical spine: The craniocervical junction is normal. Upper cervical spine is within normal limits. Marrow signal is unremarkable. Sinuses/Orbits: The paranasal sinuses and mastoid air cells are clear. Right lens replacement is present. Globes and orbits are otherwise normal. IMPRESSION: 1. Normal MRI appearance of the brain for age. No acute or focal lesion to explain syncopal episodes. Electronically Signed   By: San Morelle M.D.   On: 07/12/2018 21:06   Mr Thoracic Spine Wo Contrast  Result Date: 07/12/2018 CLINICAL  DATA:  Initial evaluation for acute vertebral body fractures. EXAM: MRI THORACIC AND LUMBAR SPINE WITHOUT CONTRAST TECHNIQUE: Multiplanar and multiecho pulse sequences of the thoracic and lumbar spine were obtained without intravenous contrast. COMPARISON:  Prior CT from earlier the same day. FINDINGS: MRI THORACIC SPINE FINDINGS Alignment: Vertebral bodies normally aligned with preservation of the normal thoracic kyphosis. No listhesis. Vertebrae: Acute compression fractures involving the inferior endplates of S50 and L1, described on corresponding lumbar spine portion of this exam. Vertebral body heights otherwise maintained without evidence for acute or chronic fracture. Underlying bone marrow signal intensity within normal limits. Few small benign hemangiomas noted, most prominent of which in the T11 vertebral body. No worrisome osseous lesions. No other abnormal marrow edema. Cord:  Signal intensity within the thoracic spinal cord is normal. Paraspinal and other soft tissues: Paraspinous soft tissues demonstrate no acute finding. Small layering bilateral pleural effusions noted. Cholelithiasis. Asymmetric right renal atrophy noted. Disc levels: No significant disc pathology seen within the thoracic spine. No canal or neural foraminal stenosis. MRI LUMBAR SPINE FINDINGS Segmentation: Standard. Lowest well-formed disc labeled the L5-S1 level. Alignment: Trace 2 mm retrolisthesis of L5 on S1.  Alignment otherwise normal with preservation of the normal lumbar lordosis. Vertebrae: Acute compression fracture extends through the inferior endplate of X83. Associated mild central height loss of up to 25% without bony retropulsion. Additional acute compression fracture extends through the inferior endplate of L1 with up to 25% central height loss with minimal 3 mm bony retropulsion. These are benign/mechanical in appearance. Vertebral body heights otherwise maintained without evidence for acute or chronic fracture.  Underlying bone marrow signal intensity within normal limits. Few small benign hemangiomas noted. No worrisome osseous lesions. Reactive endplate changes noted about the L4-5 and L5-S1 interspaces. Mild edema about the left L3-4 facet due to facet arthritis. No other abnormal marrow edema. Conus medullaris and cauda equina: Conus extends to the L2 level. Conus and cauda equina appear normal. Paraspinal and other soft tissues: Paraspinous soft tissues demonstrate no acute finding. No other vessel oral abnormality. Disc levels: L1-2: Trace 3 mm bony retropulsion related to the L1 compression fracture. Minimal flattening of the ventral thecal sac without stenosis. Foramina remain patent. L2-3: Disc desiccation without significant disc bulge. No stenosis or impingement. L3-4: Minimal annular disc bulge with disc desiccation. Mild facet and ligament flavum hypertrophy. No significant canal or foraminal stenosis. L4-5: Chronic intervertebral disc space narrowing with diffuse disc bulge and disc desiccation. Associated reactive endplate changes. Mild facet and ligament flavum hypertrophy. Resultant moderate canal with bilateral subarticular stenosis. Mild left with mild to moderate right L4 foraminal narrowing. L5-S1: Trace retrolisthesis. Diffuse degenerative disc bulge with disc desiccation and intervertebral disc space narrowing. Chronic reactive endplate changes with marginal endplate osteophytic spurring. Mild bilateral facet hypertrophy. No significant canal or lateral recess stenosis. Mild bilateral L5 foraminal narrowing. IMPRESSION: 1. Acute compression fractures involving the inferior endplates of J82 and L1 with up to 25% central height loss. Trace 3 mm bony retropulsion at L1 without associated stenosis. No significant bony retropulsion or stenosis at T12. 2. Multifactorial degenerative changes at L4-5 with resultant moderate canal with bilateral subarticular stenosis, with mild to moderate right greater than  left L4 foraminal narrowing. 3. Degenerative spondylolysis at L5-S1 with resultant mild bilateral L5 foraminal stenosis. 4. Small bilateral pleural effusions. Electronically Signed   By: Jeannine Boga M.D.   On: 07/12/2018 21:47   Mr Lumbar Spine Wo Contrast  Result Date: 07/12/2018 CLINICAL DATA:  Initial evaluation for acute vertebral body fractures. EXAM: MRI THORACIC AND LUMBAR SPINE WITHOUT CONTRAST TECHNIQUE: Multiplanar and multiecho pulse sequences of the thoracic and lumbar spine were obtained without intravenous contrast. COMPARISON:  Prior CT from earlier the same day. FINDINGS: MRI THORACIC SPINE FINDINGS Alignment: Vertebral bodies normally aligned with preservation of the normal thoracic kyphosis. No listhesis. Vertebrae: Acute compression fractures involving the inferior endplates of N05 and L1, described on corresponding lumbar spine portion of this exam. Vertebral body heights otherwise maintained without evidence for acute or chronic fracture. Underlying bone marrow signal intensity within normal limits. Few small benign hemangiomas noted, most prominent of which in the T11 vertebral body. No worrisome osseous lesions. No other abnormal marrow edema. Cord:  Signal intensity within the thoracic spinal cord is normal. Paraspinal and other soft tissues: Paraspinous soft tissues demonstrate no acute finding. Small layering bilateral pleural effusions noted. Cholelithiasis. Asymmetric right renal atrophy noted. Disc levels: No significant disc pathology seen within the thoracic spine. No canal or neural foraminal stenosis. MRI LUMBAR SPINE FINDINGS Segmentation: Standard. Lowest well-formed disc labeled the L5-S1 level. Alignment: Trace 2 mm retrolisthesis of L5 on S1. Alignment otherwise normal with  preservation of the normal lumbar lordosis. Vertebrae: Acute compression fracture extends through the inferior endplate of Q68. Associated mild central height loss of up to 25% without bony  retropulsion. Additional acute compression fracture extends through the inferior endplate of L1 with up to 25% central height loss with minimal 3 mm bony retropulsion. These are benign/mechanical in appearance. Vertebral body heights otherwise maintained without evidence for acute or chronic fracture. Underlying bone marrow signal intensity within normal limits. Few small benign hemangiomas noted. No worrisome osseous lesions. Reactive endplate changes noted about the L4-5 and L5-S1 interspaces. Mild edema about the left L3-4 facet due to facet arthritis. No other abnormal marrow edema. Conus medullaris and cauda equina: Conus extends to the L2 level. Conus and cauda equina appear normal. Paraspinal and other soft tissues: Paraspinous soft tissues demonstrate no acute finding. No other vessel oral abnormality. Disc levels: L1-2: Trace 3 mm bony retropulsion related to the L1 compression fracture. Minimal flattening of the ventral thecal sac without stenosis. Foramina remain patent. L2-3: Disc desiccation without significant disc bulge. No stenosis or impingement. L3-4: Minimal annular disc bulge with disc desiccation. Mild facet and ligament flavum hypertrophy. No significant canal or foraminal stenosis. L4-5: Chronic intervertebral disc space narrowing with diffuse disc bulge and disc desiccation. Associated reactive endplate changes. Mild facet and ligament flavum hypertrophy. Resultant moderate canal with bilateral subarticular stenosis. Mild left with mild to moderate right L4 foraminal narrowing. L5-S1: Trace retrolisthesis. Diffuse degenerative disc bulge with disc desiccation and intervertebral disc space narrowing. Chronic reactive endplate changes with marginal endplate osteophytic spurring. Mild bilateral facet hypertrophy. No significant canal or lateral recess stenosis. Mild bilateral L5 foraminal narrowing. IMPRESSION: 1. Acute compression fractures involving the inferior endplates of T41 and L1 with up  to 25% central height loss. Trace 3 mm bony retropulsion at L1 without associated stenosis. No significant bony retropulsion or stenosis at T12. 2. Multifactorial degenerative changes at L4-5 with resultant moderate canal with bilateral subarticular stenosis, with mild to moderate right greater than left L4 foraminal narrowing. 3. Degenerative spondylolysis at L5-S1 with resultant mild bilateral L5 foraminal stenosis. 4. Small bilateral pleural effusions. Electronically Signed   By: Jeannine Boga M.D.   On: 07/12/2018 21:47   Ir Kypho Lumbar Inc Fx Reduce Bone Bx Uni/bil Cannulation Inc/imaging  Result Date: 07/14/2018 INDICATION: 68 year old female status post fall with acute and highly symptomatic fractures of the inferior endplates of D62 and L1. She presents for cement augmentation with kyphoplasty. MEDICATIONS: As antibiotic prophylaxis, 2 g Ancef was ordered pre-procedure and administered intravenously within 1 hour of incision. ANESTHESIA/SEDATION: Moderate (conscious) sedation was employed during this procedure. A total of Versed 5.5 mg and Fentanyl 200 mcg was administered intravenously. Moderate Sedation Time: 32 minutes. The patient's level of consciousness and vital signs were monitored continuously by radiology nursing throughout the procedure under my direct supervision. FLUOROSCOPY TIME:  Fluoroscopy Time: 8 minutes 18 seconds (229 mGy) COMPLICATIONS: None immediate. PROCEDURE: Following a full explanation of the procedure along with the potential associated complications, an informed witnessed consent was obtained. The patient was placed prone on the fluoroscopic table. The skin overlying the thoracolumbar region was then prepped and draped in the usual sterile fashion. The left pedicle at T12 was then infiltrated with 0.25% bupivacaine followed by the advancement of an 11-gauge Jamshidi needle through the left pedicle into the posterior one-third at T12. This was then exchanged for a  Kyphon advanced osteo introducer system comprised of a working cannula and a Kyphon osteo  drill. This combination was then advanced over a Kyphon osteo bone pin until the tip of the Kyphon osteo drill was in the posterior third at T12. At this time, the bone pin was removed. In a medial trajectory, the combination was advanced until the tip of the working cannula was inside the posterior one-third at T12. Through the working cannula, a Kyphon inflatable bone tamp 15 x 20 was advanced and positioned with the distal marker 5 mm from the anterior aspect of T12. Crossing of the midline was seen on the AP projection. At this time, the balloon was expanded using contrast via a Kyphon inflation syringe device via microtubing. Inflations were continued until there was apposition with the inferior endplate. Attention was next turned to the L1 vertebral body. Using similar technique, a second 11 gauge trocar needle was advanced through the left pedicle of L1 and into the posterior 1/3 of L1. The osteo drill was then advanced through the vertebral body to within 5 mm of the anterior cortex. Through the working cannula, a second kypho on inflatable balloon tamponade 15 x 20 was advanced and position with the distal marker 5 mm from the anterior aspect of L1. Crossing of the midline was noted on the AP projection. At this time, the balloon was expanded using contrast via a Kyphon inflation syringe device. At this time, methylmethacrylate mixture was reconstituted with Tobramycin in the Kyphon bone mixing device system. This was then loaded onto the Kyphon bone fillers. The balloon at T12 was deflated and removed followed by the instillation of methylmethacrylate mixture with excellent filling in the AP and lateral projections. No extravasation was noted in the disk spaces or posteriorly into the spinal canal. No epidural venous contamination was seen. The balloon at L1 was then deflated and removed followed by installation of  methylmethacrylate mixture with excellent filling in the AP and lateral projections. No extravasation was noted in the disc spaces or posteriorly into the spinal canal. No epidural venous contamination was seen. The working cannula and the bone filler were then retrieved and removed. IMPRESSION: 1. Status post vertebral body augmentation using balloon kyphoplasty at T12 as described without event. 2. Status post vertebral body augmentation using balloon kyphoplasty at L1 as described without event. Signed, Criselda Peaches, MD, [place] [facility] Radiology Electronically Signed   By: Jacqulynn Cadet M.D.   On: 07/14/2018 16:46   Ir Kypho Ea Addl Level Thoracic Or Lumbar  Result Date: 07/14/2018 INDICATION: 68 year old female status post fall with acute and highly symptomatic fractures of the inferior endplates of E95 and L1. She presents for cement augmentation with kyphoplasty. MEDICATIONS: As antibiotic prophylaxis, 2 g Ancef was ordered pre-procedure and administered intravenously within 1 hour of incision. ANESTHESIA/SEDATION: Moderate (conscious) sedation was employed during this procedure. A total of Versed 5.5 mg and Fentanyl 200 mcg was administered intravenously. Moderate Sedation Time: 32 minutes. The patient's level of consciousness and vital signs were monitored continuously by radiology nursing throughout the procedure under my direct supervision. FLUOROSCOPY TIME:  Fluoroscopy Time: 8 minutes 18 seconds (284 mGy) COMPLICATIONS: None immediate. PROCEDURE: Following a full explanation of the procedure along with the potential associated complications, an informed witnessed consent was obtained. The patient was placed prone on the fluoroscopic table. The skin overlying the thoracolumbar region was then prepped and draped in the usual sterile fashion. The left pedicle at T12 was then infiltrated with 0.25% bupivacaine followed by the advancement of an  11-gauge Jamshidi needle  through the left pedicle into the posterior one-third at T12. This was then exchanged for a Kyphon advanced osteo introducer system comprised of a working cannula and a Kyphon osteo drill. This combination was then advanced over a Kyphon osteo bone pin until the tip of the Kyphon osteo drill was in the posterior third at T12. At this time, the bone pin was removed. In a medial trajectory, the combination was advanced until the tip of the working cannula was inside the posterior one-third at T12. Through the working cannula, a Kyphon inflatable bone tamp 15 x 20 was advanced and positioned with the distal marker 5 mm from the anterior aspect of T12. Crossing of the midline was seen on the AP projection. At this time, the balloon was expanded using contrast via a Kyphon inflation syringe device via microtubing. Inflations were continued until there was apposition with the inferior endplate. Attention was next turned to the L1 vertebral body. Using similar technique, a second 11 gauge trocar needle was advanced through the left pedicle of L1 and into the posterior 1/3 of L1. The osteo drill was then advanced through the vertebral body to within 5 mm of the anterior cortex. Through the working cannula, a second kypho on inflatable balloon tamponade 15 x 20 was advanced and position with the distal marker 5 mm from the anterior aspect of L1. Crossing of the midline was noted on the AP projection. At this time, the balloon was expanded using contrast via a Kyphon inflation syringe device. At this time, methylmethacrylate mixture was reconstituted with Tobramycin in the Kyphon bone mixing device system. This was then loaded onto the Kyphon bone fillers. The balloon at T12 was deflated and removed followed by the instillation of methylmethacrylate mixture with excellent filling in the AP and lateral projections. No extravasation was noted in the disk spaces or posteriorly into the spinal canal. No  epidural venous contamination was seen. The balloon at L1 was then deflated and removed followed by installation of methylmethacrylate mixture with excellent filling in the AP and lateral projections. No extravasation was noted in the disc spaces or posteriorly into the spinal canal. No epidural venous contamination was seen. The working cannula and the bone filler were then retrieved and removed. IMPRESSION: 1. Status post vertebral body augmentation using balloon kyphoplasty at T12 as described without event. 2. Status post vertebral body augmentation using balloon kyphoplasty at L1 as described without event. Signed, Criselda Peaches, MD, [place] [facility] Radiology Electronically Signed   By: Jacqulynn Cadet M.D.   On: 07/14/2018 16:46        Scheduled Meds: . aspirin EC  325 mg Oral Once per day on Mon Wed Fri  . budesonide (PULMICORT) nebulizer solution  0.5 mg Nebulization BID  . enoxaparin (LOVENOX) injection  40 mg Subcutaneous Q24H  . ferrous sulfate   Oral Daily  . lamoTRIgine  100 mg Oral Daily  . lamoTRIgine  200 mg Oral QHS  . lidocaine (PF)      . pantoprazole  40 mg Oral Daily  . potassium chloride  40 mEq Oral Daily  . topiramate  200 mg Oral BID  . traZODone  300 mg Oral QHS  . zonisamide  300 mg Oral Daily   Continuous Infusions: . sodium chloride Stopped (07/14/18 1428)     LOS: 2 days     Vernell Leep, MD, FACP, [facility]. [facility]  To contact the attending provider between 7A-7P or the covering provider  during after hours 7P-7A, please log into the web site [email or web address] and access using universal [place] password for that web site. If you do not have the password, please call the hospital operator.  07/14/2018, 5:39 PM

## 2018-07-14 NOTE — Progress Notes (Signed)
PT Cancellation Note  Patient Details Name: Kelli Morales MRN: [id number] DOB: Jan 20, 1951   Cancelled Treatment:    Reason Eval/Treat Not Completed: Medical issues which prohibited therapy. Pt having surgery today. Will check back tomorrow for post-op orders.   Galen Manila 07/14/2018, 12:08 PM

## 2018-07-15 ENCOUNTER — Inpatient Hospital Stay (HOSPITAL_COMMUNITY): Payer: Medicare Other

## 2018-07-15 DIAGNOSIS — R55 Syncope and collapse: Secondary | ICD-10-CM

## 2018-07-15 DIAGNOSIS — M4846XD Fatigue fracture of vertebra, lumbar region, subsequent encounter for fracture with routine healing: Secondary | ICD-10-CM

## 2018-07-15 LAB — BASIC METABOLIC PANEL
ANION GAP: 4 — AB (ref 5–15)
BUN: 15 mg/dL (ref 8–23)
CO2: 23 mmol/L (ref 22–32)
Calcium: 9.2 mg/dL (ref 8.9–10.3)
Chloride: 114 mmol/L — ABNORMAL HIGH (ref 98–111)
Creatinine, Ser: 1.39 mg/dL — ABNORMAL HIGH (ref 0.44–1.00)
GFR calc non Af Amer: 39 mL/min — ABNORMAL LOW (ref >60)
GFR, EST AFRICAN AMERICAN: 45 mL/min — AB (ref >60)
Glucose, Bld: 95 mg/dL (ref 70–99)
Potassium: 4 mmol/L (ref 3.5–5.1)
Sodium: 141 mmol/L (ref 135–145)

## 2018-07-15 LAB — ECHOCARDIOGRAM COMPLETE
Height: 65.5 in
Weight: 2389 oz

## 2018-07-15 MED ORDER — DOCUSATE SODIUM 100 MG PO CAPS: 100.0000 mg | ORAL_CAPSULE | Freq: Two times a day (BID) | ORAL | 0 refills | Status: DC

## 2018-07-15 NOTE — Discharge Summary (Signed)
Physician Discharge Summary  Kelli Morales Lint ZSW:[id number] DOB: 1950-09-20  PCP: Harlan Stains, MD  Admit date: 07/12/2018 Discharge date: 07/15/2018  Recommendations for Outpatient Follow-up:  1. Dr. Harlan Stains, PCP in 1 week with repeat labs (CBC & BMP). 2. [facility]/orthopedics.  Home Health: None Equipment/Devices: None  Discharge Condition: Improved and stable CODE STATUS: DNR Diet recommendation: Heart healthy diet.  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  She had associated back pain & evaluation of which revealed acute compression fracture of T12-L1.  IR consulted and patient underwent kyphoplasty 2/5.  Assessment & Plan:  1. Syncope and falls: Etiology of syncope unclear.  Telemetry without arrhythmias.  MRI brain negative.  Seizures felt less likely.  Dehydration suspected but orthostatics negative.  Patient reports that she has had prior syncopal episodes. TTE: LVEF 55-60%.  Patient has been counseled that she should not drive for 6 months, not happy but verbalized understanding. ?  From polypharmacy- may need psych follow-up to minimize if possible.  Advised to follow-up with PCP.  If patient has further episodes of syncope, will need more extensive evaluation including heart monitor. 2. Acute low back pain/T12-L1 fracture: IR was consulted and patient underwent kyphoplasty on 2/5.  Pain has significantly improved.  PT evaluated and do not have follow-up recommendations. 3. Acute kidney injury on stage III chronic kidney disease: Presented with creatinine of 1.63.  ARB was temporarily held and this improved and stabilized in the 1.3 range which is most likely her baseline.  Last creatinine in  2014 was 1.36.  Resumed ARB at discharge.  Closely follow BMP as outpatient.  Advised to avoid nephrotoxic's. 4. Essential hypertension: Mildly uncontrolled at times.  ARB held while hospitalized and treated with PRN IV hydralazine and ARB resumed at DC.  5. Normocytic anemia: Unclear etiology.  Stable.  Outpatient follow-up and evaluation. 6. Mild thrombocytopenia: Unclear etiology but seems chronic and intermittent.  Had platelets of 123 in March 2014.  No bleeding reported.  Follow CBCs as outpatient. 7. Migraine: Continuing Topamax and Zonegran. 8. Bipolar disorder: Reportedly follows with psychiatry, continue lamotrigine, Klonopin and trazodone. 9. History of chronic back pains: Continue tizanidine. 10. Hypokalemia: Replaced. 11. Osteoporosis: Will need outpatient DEXA scan.  Patient on Prolia and calcium.   Consultants:  IR  Procedures:  T12 and L1 kyphoplasty by IR on 2/5.  Discharge Instructions  Discharge Instructions    Call MD for:   Complete by:  As directed    Recurrent episodes of passing out.   Call MD for:  difficulty breathing, headache or visual disturbances   Complete by:  As directed    Call MD for:  extreme fatigue   Complete by:  As directed    Call MD for:  persistant dizziness or light-headedness   Complete by:  As directed    Call MD for:  persistant nausea and vomiting   Complete by:  As directed    Call MD for:  redness, tenderness, or signs of infection (pain, swelling, redness, odor or green/yellow discharge around incision site)   Complete by:  As directed    Call MD for:  severe uncontrolled pain   Complete by:  As directed    Call MD for:  temperature >100.4   Complete by:  As directed    Diet - low sodium heart healthy   Complete by:  As directed    Driving Restrictions   Complete by:  As directed    No driving for 6 months.   Increase activity slowly   Complete by:  As directed        Medication List    TAKE these medications    ARNUITY ELLIPTA IN Inhale 1 puff into the lungs at bedtime.   aspirin 81 MG tablet Take 325 mg by mouth 3 (three) times a week. Min, Wed, Fri   CALCIUM PO Take 1 tablet by mouth daily.   cholecalciferol 1000 units tablet Commonly known as:  VITAMIN D Take 1,000 Units by mouth daily.   clonazePAM 0.5 MG tablet Commonly known as:  KLONOPIN Take 1 tablet (0.5 mg total) by mouth 3 (three) times daily as needed for anxiety (and insomnia).   [employer] DRY EYE RELIEF OP Apply 1 drop to eye at bedtime as needed (dry eyes).   denosumab 60 MG/ML Soln injection Commonly known as:  PROLIA Inject 60 mg into the skin every 6 (six) months. Administer in upper arm, thigh, or abdomen   docusate sodium 100 MG capsule Commonly known as:  COLACE Take 1 capsule (100 mg total) by mouth 2 (two) times daily.   fish oil-omega-3 fatty acids 1000 MG capsule Take 1 g by mouth daily.   fluticasone 50 MCG/ACT nasal spray Commonly known as:  FLONASE Place 2 sprays into the nose daily.   HYDROcodone-acetaminophen 5-325 MG tablet Commonly known as:  NORCO/VICODIN Take 1 tablet by mouth every 6 (six) hours as needed for severe pain.   IRON PO Take 1 tablet by mouth daily.   lamoTRIgine 100 MG tablet Commonly known as:  LAMICTAL TAKE 1 TABLET BY MOUTH IN  THE MORNING AND 2 TABLETS  AT BEDTIME.   MUCINEX PO Take 1 tablet by mouth at bedtime.   multivitamin with minerals Tabs tablet Take 1 tablet by mouth daily.   olmesartan 40 MG tablet Commonly known as:  BENICAR Take 40 mg by mouth daily.   pantoprazole 40 MG tablet Commonly known as:  PROTONIX Take 40 mg by mouth daily.   phenylephrine 10 MG Tabs tablet Commonly known as:  SUDAFED PE Take 10 mg by mouth at bedtime.   promethazine 25 MG tablet Commonly known as:  PHENERGAN Take 25 mg by mouth every 6 (six) hours as needed. Nausea   tiZANidine 2 MG tablet Commonly known as:  ZANAFLEX Take 2 mg by mouth as needed (migraines).    topiramate 200 MG tablet Commonly known as:  TOPAMAX Take 200 mg by mouth 2 (two) times daily.   traZODone 100 MG tablet Commonly known as:  DESYREL TAKE 3 TABLETS BY MOUTH  EVERY NIGHT AT BEDTIME   VITAMIN C PO Take 1 tablet by mouth daily.   zonisamide 100 MG capsule Commonly known as:  ZONEGRAN Take 300 mg by mouth daily.      Follow-up Information    Harlan Stains, MD. Schedule an appointment as soon as possible for a visit in 1 week(s).   Specialty:  Family Medicine Why:  To be seen with repeat labs (CBC & BMP). Contact information: [address] [place] [address] [phone]        Schedule an appointment as soon as possible for a visit  with [facility], [facility].   Specialty:  Specialist Contact  information: [address] [place] [address] [phone]          Allergies  Allergen Reactions  . Divalproex Sodium     Became comatose when she took Depakote and Geodon together.  Lindajo Royal [Ziprasidone Hydrochloride]     Became comatose when she took Depakote and Geodon together.  . Nsaids     "kidney problem"  . Seroquel Polo Riley Fumerate]       Procedures/Studies: Dg Thoracic Spine 4v  Result Date: 07/12/2018 CLINICAL DATA:  [place]. Pain. EXAM: LUMBAR SPINE - COMPLETE 4+ VIEW; THORACIC SPINE - 4+ VIEW COMPARISON:  None. FINDINGS: Alignment is normal. On the lateral radiographs, there is concern for slight wedging of T12, inferior endplate, suspicious for fracture. In addition, on the lateral radiograph, there is inferior endplate irregularity at L1, also concerning for fracture. Recommend CT of the lumbar spine to include the lower thoracic region up to T10 for confirmation. Lower lumbar facet arthropathy. Disc space narrowing at L4-5 and L5-S1. No rib fracture. No lung pathology. Pedicles are intact. IMPRESSION: Suspicion for mild T12 and L1 compression fractures. Recommend CT of the lumbar spine to include the lower  thoracic region for confirmation. Electronically Signed   By: Staci Righter M.D.   On: 07/12/2018 12:25   Dg Lumbar Spine Complete  Result Date: 07/12/2018 CLINICAL DATA:  [place]. Pain. EXAM: LUMBAR SPINE - COMPLETE 4+ VIEW; THORACIC SPINE - 4+ VIEW COMPARISON:  None. FINDINGS: Alignment is normal. On the lateral radiographs, there is concern for slight wedging of T12, inferior endplate, suspicious for fracture. In addition, on the lateral radiograph, there is inferior endplate irregularity at L1, also concerning for fracture. Recommend CT of the lumbar spine to include the lower thoracic region up to T10 for confirmation. Lower lumbar facet arthropathy. Disc space narrowing at L4-5 and L5-S1. No rib fracture. No lung pathology. Pedicles are intact. IMPRESSION: Suspicion for mild T12 and L1 compression fractures. Recommend CT of the lumbar spine to include the lower thoracic region for confirmation. Electronically Signed   By: Staci Righter M.D.   On: 07/12/2018 12:25   Ct Lumbar Spine Wo Contrast  Result Date: 07/12/2018 CLINICAL DATA:  Syncope and fall. Low back pain. Thoracolumbar compression fractures on radiographs. EXAM: CT LUMBAR SPINE WITHOUT CONTRAST TECHNIQUE: Multidetector CT imaging of the lumbar spine was performed without intravenous contrast administration. Multiplanar CT image reconstructions were also generated. COMPARISON:  Thoracic and lumbar spine radiographs 07/12/2018 FINDINGS: Segmentation: 5 lumbar type vertebrae. Alignment: Normal. Vertebrae: Acute appearing inferior endplate compression fractures are present at T12 and L1 with 10-15% height loss. The L1 fracture involves the posterior vertebral body cortex with 3 mm of retropulsion. No convincing posterior element fracture is identified. No suspicious osseous lesion is seen. Bridging osteophytes are noted across the anterosuperior aspects of the SI joints bilaterally. Paraspinal and other soft tissues: Marked asymmetric right renal  atrophy. Disc levels: Lower lumbar disc degeneration with moderate disc space narrowing, degenerative endplate sclerosis and spurring, and vacuum disc at L4-5 and L5-S1. Mild spinal stenosis and mild-to-moderate bilateral neural foraminal stenosis at L4-5 due to disc bulging and mild posterior element hypertrophy. Mild bilateral neural foraminal stenosis at L5-S1. IMPRESSION: 1. Acute T12 and L1 compression fractures. 3 mm retropulsion at L1 without significant stenosis. 2. Lower lumbar disc and facet degeneration, most notable at L4-5 where there is mild spinal and mild-to-moderate bilateral neural foraminal stenosis. Electronically Signed   By: Logan Bores M.D.   On: 07/12/2018 15:45  Mr Brain Wo Contrast  Result Date: 07/12/2018 CLINICAL DATA:  Multiple syncopal episodes.  Fall. EXAM: MRI HEAD WITHOUT CONTRAST TECHNIQUE: Multiplanar, multiecho pulse sequences of the brain and surrounding structures were obtained without intravenous contrast. COMPARISON:  None. FINDINGS: Brain: The diffusion-weighted images demonstrate no evidence for acute or subacute infarction. Study is mildly degraded by patient motion. No significant white matter lesions are present. The ventricles are of normal size. No significant extraaxial fluid collection is present. The internal auditory canals are within normal limits. The brainstem and cerebellum are within normal limits. Vascular: Flow is present in the major intracranial arteries. Skull and upper cervical spine: The craniocervical junction is normal. Upper cervical spine is within normal limits. Marrow signal is unremarkable. Sinuses/Orbits: The paranasal sinuses and mastoid air cells are clear. Right lens replacement is present. Globes and orbits are otherwise normal. IMPRESSION: 1. Normal MRI appearance of the brain for age. No acute or focal lesion to explain syncopal episodes. Electronically Signed   By: San Morelle M.D.   On: 07/12/2018 21:06   Mr Thoracic Spine  Wo Contrast  Result Date: 07/12/2018 CLINICAL DATA:  Initial evaluation for acute vertebral body fractures. EXAM: MRI THORACIC AND LUMBAR SPINE WITHOUT CONTRAST TECHNIQUE: Multiplanar and multiecho pulse sequences of the thoracic and lumbar spine were obtained without intravenous contrast. COMPARISON:  Prior CT from earlier the same day. FINDINGS: MRI THORACIC SPINE FINDINGS Alignment: Vertebral bodies normally aligned with preservation of the normal thoracic kyphosis. No listhesis. Vertebrae: Acute compression fractures involving the inferior endplates of J88 and L1, described on corresponding lumbar spine portion of this exam. Vertebral body heights otherwise maintained without evidence for acute or chronic fracture. Underlying bone marrow signal intensity within normal limits. Few small benign hemangiomas noted, most prominent of which in the T11 vertebral body. No worrisome osseous lesions. No other abnormal marrow edema. Cord:  Signal intensity within the thoracic spinal cord is normal. Paraspinal and other soft tissues: Paraspinous soft tissues demonstrate no acute finding. Small layering bilateral pleural effusions noted. Cholelithiasis. Asymmetric right renal atrophy noted. Disc levels: No significant disc pathology seen within the thoracic spine. No canal or neural foraminal stenosis. MRI LUMBAR SPINE FINDINGS Segmentation: Standard. Lowest well-formed disc labeled the L5-S1 level. Alignment: Trace 2 mm retrolisthesis of L5 on S1. Alignment otherwise normal with preservation of the normal lumbar lordosis. Vertebrae: Acute compression fracture extends through the inferior endplate of C16. Associated mild central height loss of up to 25% without bony retropulsion. Additional acute compression fracture extends through the inferior endplate of L1 with up to 25% central height loss with minimal 3 mm bony retropulsion. These are benign/mechanical in appearance. Vertebral body heights otherwise maintained without  evidence for acute or chronic fracture. Underlying bone marrow signal intensity within normal limits. Few small benign hemangiomas noted. No worrisome osseous lesions. Reactive endplate changes noted about the L4-5 and L5-S1 interspaces. Mild edema about the left L3-4 facet due to facet arthritis. No other abnormal marrow edema. Conus medullaris and cauda equina: Conus extends to the L2 level. Conus and cauda equina appear normal. Paraspinal and other soft tissues: Paraspinous soft tissues demonstrate no acute finding. No other vessel oral abnormality. Disc levels: L1-2: Trace 3 mm bony retropulsion related to the L1 compression fracture. Minimal flattening of the ventral thecal sac without stenosis. Foramina remain patent. L2-3: Disc desiccation without significant disc bulge. No stenosis or impingement. L3-4: Minimal annular disc bulge with disc desiccation. Mild facet and ligament flavum hypertrophy. No significant canal or  foraminal stenosis. L4-5: Chronic intervertebral disc space narrowing with diffuse disc bulge and disc desiccation. Associated reactive endplate changes. Mild facet and ligament flavum hypertrophy. Resultant moderate canal with bilateral subarticular stenosis. Mild left with mild to moderate right L4 foraminal narrowing. L5-S1: Trace retrolisthesis. Diffuse degenerative disc bulge with disc desiccation and intervertebral disc space narrowing. Chronic reactive endplate changes with marginal endplate osteophytic spurring. Mild bilateral facet hypertrophy. No significant canal or lateral recess stenosis. Mild bilateral L5 foraminal narrowing. IMPRESSION: 1. Acute compression fractures involving the inferior endplates of P10 and L1 with up to 25% central height loss. Trace 3 mm bony retropulsion at L1 without associated stenosis. No significant bony retropulsion or stenosis at T12. 2. Multifactorial degenerative changes at L4-5 with resultant moderate canal with bilateral subarticular stenosis,  with mild to moderate right greater than left L4 foraminal narrowing. 3. Degenerative spondylolysis at L5-S1 with resultant mild bilateral L5 foraminal stenosis. 4. Small bilateral pleural effusions. Electronically Signed   By: Jeannine Boga M.D.   On: 07/12/2018 21:47   Mr Lumbar Spine Wo Contrast  Result Date: 07/12/2018 CLINICAL DATA:  Initial evaluation for acute vertebral body fractures. EXAM: MRI THORACIC AND LUMBAR SPINE WITHOUT CONTRAST TECHNIQUE: Multiplanar and multiecho pulse sequences of the thoracic and lumbar spine were obtained without intravenous contrast. COMPARISON:  Prior CT from earlier the same day. FINDINGS: MRI THORACIC SPINE FINDINGS Alignment: Vertebral bodies normally aligned with preservation of the normal thoracic kyphosis. No listhesis. Vertebrae: Acute compression fractures involving the inferior endplates of C58 and L1, described on corresponding lumbar spine portion of this exam. Vertebral body heights otherwise maintained without evidence for acute or chronic fracture. Underlying bone marrow signal intensity within normal limits. Few small benign hemangiomas noted, most prominent of which in the T11 vertebral body. No worrisome osseous lesions. No other abnormal marrow edema. Cord:  Signal intensity within the thoracic spinal cord is normal. Paraspinal and other soft tissues: Paraspinous soft tissues demonstrate no acute finding. Small layering bilateral pleural effusions noted. Cholelithiasis. Asymmetric right renal atrophy noted. Disc levels: No significant disc pathology seen within the thoracic spine. No canal or neural foraminal stenosis. MRI LUMBAR SPINE FINDINGS Segmentation: Standard. Lowest well-formed disc labeled the L5-S1 level. Alignment: Trace 2 mm retrolisthesis of L5 on S1. Alignment otherwise normal with preservation of the normal lumbar lordosis. Vertebrae: Acute compression fracture extends through the inferior endplate of N27. Associated mild central  height loss of up to 25% without bony retropulsion. Additional acute compression fracture extends through the inferior endplate of L1 with up to 25% central height loss with minimal 3 mm bony retropulsion. These are benign/mechanical in appearance. Vertebral body heights otherwise maintained without evidence for acute or chronic fracture. Underlying bone marrow signal intensity within normal limits. Few small benign hemangiomas noted. No worrisome osseous lesions. Reactive endplate changes noted about the L4-5 and L5-S1 interspaces. Mild edema about the left L3-4 facet due to facet arthritis. No other abnormal marrow edema. Conus medullaris and cauda equina: Conus extends to the L2 level. Conus and cauda equina appear normal. Paraspinal and other soft tissues: Paraspinous soft tissues demonstrate no acute finding. No other vessel oral abnormality. Disc levels: L1-2: Trace 3 mm bony retropulsion related to the L1 compression fracture. Minimal flattening of the ventral thecal sac without stenosis. Foramina remain patent. L2-3: Disc desiccation without significant disc bulge. No stenosis or impingement. L3-4: Minimal annular disc bulge with disc desiccation. Mild facet and ligament flavum hypertrophy. No significant canal or foraminal stenosis. L4-5: Chronic  intervertebral disc space narrowing with diffuse disc bulge and disc desiccation. Associated reactive endplate changes. Mild facet and ligament flavum hypertrophy. Resultant moderate canal with bilateral subarticular stenosis. Mild left with mild to moderate right L4 foraminal narrowing. L5-S1: Trace retrolisthesis. Diffuse degenerative disc bulge with disc desiccation and intervertebral disc space narrowing. Chronic reactive endplate changes with marginal endplate osteophytic spurring. Mild bilateral facet hypertrophy. No significant canal or lateral recess stenosis. Mild bilateral L5 foraminal narrowing. IMPRESSION: 1. Acute compression fractures involving the  inferior endplates of M38 and L1 with up to 25% central height loss. Trace 3 mm bony retropulsion at L1 without associated stenosis. No significant bony retropulsion or stenosis at T12. 2. Multifactorial degenerative changes at L4-5 with resultant moderate canal with bilateral subarticular stenosis, with mild to moderate right greater than left L4 foraminal narrowing. 3. Degenerative spondylolysis at L5-S1 with resultant mild bilateral L5 foraminal stenosis. 4. Small bilateral pleural effusions. Electronically Signed   By: Jeannine Boga M.D.   On: 07/12/2018 21:47   Ir Kypho Lumbar Inc Fx Reduce Bone Bx Uni/bil Cannulation Inc/imaging  Result Date: 07/14/2018 INDICATION: 68 year old female status post fall with acute and highly symptomatic fractures of the inferior endplates of G66 and L1. She presents for cement augmentation with kyphoplasty. MEDICATIONS: As antibiotic prophylaxis, 2 g Ancef was ordered pre-procedure and administered intravenously within 1 hour of incision. ANESTHESIA/SEDATION: Moderate (conscious) sedation was employed during this procedure. A total of Versed 5.5 mg and Fentanyl 200 mcg was administered intravenously. Moderate Sedation Time: 32 minutes. The patient's level of consciousness and vital signs were monitored continuously by radiology nursing throughout the procedure under my direct supervision. FLUOROSCOPY TIME:  Fluoroscopy Time: 8 minutes 18 seconds (599 mGy) COMPLICATIONS: None immediate. PROCEDURE: Following a full explanation of the procedure along with the potential associated complications, an informed witnessed consent was obtained. The patient was placed prone on the fluoroscopic table. The skin overlying the thoracolumbar region was then prepped and draped in the usual sterile fashion. The left pedicle at T12 was then infiltrated with 0.25% bupivacaine followed by the advancement of an 11-gauge Jamshidi needle through the left pedicle into the posterior one-third at  T12. This was then exchanged for a Kyphon advanced osteo introducer system comprised of a working cannula and a Kyphon osteo drill. This combination was then advanced over a Kyphon osteo bone pin until the tip of the Kyphon osteo drill was in the posterior third at T12. At this time, the bone pin was removed. In a medial trajectory, the combination was advanced until the tip of the working cannula was inside the posterior one-third at T12. Through the working cannula, a Kyphon inflatable bone tamp 15 x 20 was advanced and positioned with the distal marker 5 mm from the anterior aspect of T12. Crossing of the midline was seen on the AP projection. At this time, the balloon was expanded using contrast via a Kyphon inflation syringe device via microtubing. Inflations were continued until there was apposition with the inferior endplate. Attention was next turned to the L1 vertebral body. Using similar technique, a second 11 gauge trocar needle was advanced through the left pedicle of L1 and into the posterior 1/3 of L1. The osteo drill was then advanced through the vertebral body to within 5 mm of the anterior cortex. Through the working cannula, a second kypho on inflatable balloon tamponade 15 x 20 was advanced and position with the distal marker 5 mm from the anterior aspect of L1. Crossing of the  midline was noted on the AP projection. At this time, the balloon was expanded using contrast via a Kyphon inflation syringe device. At this time, methylmethacrylate mixture was reconstituted with Tobramycin in the Kyphon bone mixing device system. This was then loaded onto the Kyphon bone fillers. The balloon at T12 was deflated and removed followed by the instillation of methylmethacrylate mixture with excellent filling in the AP and lateral projections. No extravasation was noted in the disk spaces or posteriorly into the spinal canal. No epidural venous contamination was seen. The balloon at L1 was then deflated and  removed followed by installation of methylmethacrylate mixture with excellent filling in the AP and lateral projections. No extravasation was noted in the disc spaces or posteriorly into the spinal canal. No epidural venous contamination was seen. The working cannula and the bone filler were then retrieved and removed. IMPRESSION: 1. Status post vertebral body augmentation using balloon kyphoplasty at T12 as described without event. 2. Status post vertebral body augmentation using balloon kyphoplasty at L1 as described without event. Signed, Criselda Peaches, MD, [facility] Electronically Signed   By: Jacqulynn Cadet M.D.   On: 07/14/2018 16:46   Ir Kypho Ea Addl Level Thoracic Or Lumbar  Result Date: 07/14/2018 INDICATION: 68 year old female status post fall with acute and highly symptomatic fractures of the inferior endplates of O24 and L1. She presents for cement augmentation with kyphoplasty. MEDICATIONS: As antibiotic prophylaxis, 2 g Ancef was ordered pre-procedure and administered intravenously within 1 hour of incision. ANESTHESIA/SEDATION: Moderate (conscious) sedation was employed during this procedure. A total of Versed 5.5 mg and Fentanyl 200 mcg was administered intravenously. Moderate Sedation Time: 32 minutes. The patient's level of consciousness and vital signs were monitored continuously by radiology nursing throughout the procedure under my direct supervision. FLUOROSCOPY TIME:  Fluoroscopy Time: 8 minutes 18 seconds (235 mGy) COMPLICATIONS: None immediate. PROCEDURE: Following a full explanation of the procedure along with the potential associated complications, an informed witnessed consent was obtained. The patient was placed prone on the fluoroscopic table. The skin overlying the thoracolumbar region was then prepped and draped in the usual sterile fashion. The left pedicle at T12 was then infiltrated with 0.25%  bupivacaine followed by the advancement of an 11-gauge Jamshidi needle through the left pedicle into the posterior one-third at T12. This was then exchanged for a Kyphon advanced osteo introducer system comprised of a working cannula and a Kyphon osteo drill. This combination was then advanced over a Kyphon osteo bone pin until the tip of the Kyphon osteo drill was in the posterior third at T12. At this time, the bone pin was removed. In a medial trajectory, the combination was advanced until the tip of the working cannula was inside the posterior one-third at T12. Through the working cannula, a Kyphon inflatable bone tamp 15 x 20 was advanced and positioned with the distal marker 5 mm from the anterior aspect of T12. Crossing of the midline was seen on the AP projection. At this time, the balloon was expanded using contrast via a Kyphon inflation syringe device via microtubing. Inflations were continued until there was apposition with the inferior endplate. Attention was next turned to the L1 vertebral body. Using similar technique, a second 11 gauge trocar needle was advanced through the left pedicle of L1 and into the posterior 1/3 of L1. The osteo drill was then advanced through the vertebral body to within 5 mm of the anterior cortex. Through the working  cannula, a second kypho on inflatable balloon tamponade 15 x 20 was advanced and position with the distal marker 5 mm from the anterior aspect of L1. Crossing of the midline was noted on the AP projection. At this time, the balloon was expanded using contrast via a Kyphon inflation syringe device. At this time, methylmethacrylate mixture was reconstituted with Tobramycin in the Kyphon bone mixing device system. This was then loaded onto the Kyphon bone fillers. The balloon at T12 was deflated and removed followed by the instillation of methylmethacrylate mixture with excellent filling in the AP and lateral projections. No extravasation was noted in the disk  spaces or posteriorly into the spinal canal. No epidural venous contamination was seen. The balloon at L1 was then deflated and removed followed by installation of methylmethacrylate mixture with excellent filling in the AP and lateral projections. No extravasation was noted in the disc spaces or posteriorly into the spinal canal. No epidural venous contamination was seen. The working cannula and the bone filler were then retrieved and removed. IMPRESSION: 1. Status post vertebral body augmentation using balloon kyphoplasty at T12 as described without event. 2. Status post vertebral body augmentation using balloon kyphoplasty at L1 as described without event. Signed, Criselda Peaches, MD, [facility] Radiology Electronically Signed   By: Jacqulynn Cadet M.D.   On: 07/14/2018 16:46      Subjective: Patient reports that she has no back pain while lying in bed.  Mild to moderate low back pain when she is ambulating but much improved compared to admission.  Denies any other complaints.  Eager to go home.  Discharge Exam:  Vitals:   07/14/18 2023 07/15/18 0609 07/15/18 0852 07/15/18 1000  BP:  138/69  128/82  Pulse:  (!) 55  (!) 58  Resp:  17  14  Temp:  98.7 F (37.1 C)  (!) 97.4 F (36.3 C)  TempSrc:  Oral  Oral  SpO2: 97% 99% 96% 98%  Weight:      Height:        General exam: Pleasant middle-aged female, moderately built and nourished seen ambulating comfortably with PT supervision. Respiratory system: Clear to auscultation. Respiratory effort normal. Cardiovascular system: S1 & S2 heard, RRR. No JVD, murmurs, rubs, gallops or clicks. No pedal edema. Gastrointestinal system: Abdomen is nondistended, soft and nontender. No organomegaly or masses felt. Normal bowel sounds heard. Central nervous system: Alert and oriented. No focal neurological deficits. Extremities: Symmetric 5 x 5 power. Skin: No rashes, lesions or ulcers.   Kyphoplasty site without acute findings. Psychiatry: Judgement and insight appear normal. Mood & affect appropriate.     The results of significant diagnostics from this hospitalization (including imaging, microbiology, ancillary and laboratory) are listed below for reference.      Labs: CBC: Recent Labs  Lab 07/12/18 1107 07/13/18 0610 07/14/18 0653  WBC 9.6 5.0 5.5  NEUTROABS  --   --  3.3  HGB 12.1 10.6* 10.4*  HCT 40.2 35.4* 34.4*  MCV 97.6 98.9 98.0  PLT 166 141* 622*   Basic Metabolic Panel: Recent Labs  Lab 07/12/18 1107 07/13/18 0610 07/14/18 0653 07/15/18 0559  NA 140 140 142 141  K 4.2 3.3* 3.7 4.0  CL 113* 113* 117* 114*  CO2 22 21* 20* 23  GLUCOSE 132* 103* 101* 95  BUN 29* 19 17 15   CREATININE 1.63* 1.38* 1.34* 1.39*  CALCIUM 9.6 8.8* 9.0 9.2  PHOS  --   --  3.5  --  Liver Function Tests: Recent Labs  Lab 07/13/18 0610 07/14/18 0653  AST 21  --   ALT 20  --   ALKPHOS 61  --   BILITOT 0.5  --   PROT 6.0*  --   ALBUMIN 3.6 3.3*   Cardiac Enzymes: Recent Labs  Lab 07/12/18 1107  TROPONINI <0.03   CBG: Recent Labs  Lab 07/12/18 1106  GLUCAP 111*   Urinalysis    Component Value Date/Time   COLORURINE YELLOW 07/12/2018 [place] 07/12/2018 1725   LABSPEC 1.020 07/12/2018 1725   PHURINE 6.0 07/12/2018 1725   GLUCOSEU NEGATIVE 07/12/2018 1725   HGBUR NEGATIVE 07/12/2018 [place] 07/12/2018 [place] 07/12/2018 1725   PROTEINUR NEGATIVE 07/12/2018 1725   UROBILINOGEN 0.2 10/21/2013 1402   NITRITE NEGATIVE 07/12/2018 1725   LEUKOCYTESUR NEGATIVE 07/12/2018 1725      Time coordinating discharge: 40 minutes  SIGNED:  Vernell Leep, MD, FACP, [facility]. [facility]  To contact the attending provider between 7A-7P or the covering provider during after hours 7P-7A, please log into the web site [email or web address] and access using universal [place] password for that web site. If  you do not have the password, please call the hospital operator.

## 2018-07-15 NOTE — Care Management Important Message (Signed)
Important Message  Patient Details  Name: Kelli Morales MRN: [id number] Date of Birth: [birth year]   Medicare Important Message Given:  Yes    [name] 07/15/2018, 11:30 AMImportant Message  Patient Details  Name: Kelli Morales MRN: [id number] Date of Birth: 06-Oct-1950   Medicare Important Message Given:  Yes    [name] 07/15/2018, 11:30 AM

## 2018-07-15 NOTE — Progress Notes (Signed)
Referring Physician(s): Nita Sells  Supervising Physician: Arne Cleveland  Patient Status:  [facility] - In-pt  Chief Complaint: "Back pain"  Subjective:  T12 and L1 compression fractures s/p kyphoplasty 07/14/2018 by Dr. Laurence Ferrari. Patient awake and alert sitting in chair. Complains of back pain, rated 5/10 at this time. States that her pain has improved since procedure 07/14/2018. T12 and L1 incision sites c/d/i.   Allergies: Divalproex sodium; Geodon [ziprasidone hydrochloride]; Nsaids; and Seroquel [quetiapine fumerate]  Medications: Prior to Admission medications   Medication Sig Start Date End Date Taking? Authorizing Provider  Ascorbic Acid (VITAMIN C PO) Take 1 tablet by mouth daily.   Yes [provider]  aspirin 81 MG tablet Take 325 mg by mouth 3 (three) times a week. Min, Wed, Fri   Yes [provider]  CALCIUM PO Take 1 tablet by mouth daily.   Yes [provider]  cholecalciferol (VITAMIN D) 1000 UNITS tablet Take 1,000 Units by mouth daily.   Yes [provider]  clonazePAM (KLONOPIN) 0.5 MG tablet Take 1 tablet (0.5 mg total) by mouth 3 (three) times daily as needed for anxiety (and insomnia). 04/15/18  Yes Cottle, Billey Co., MD  denosumab (PROLIA) 60 MG/ML SOLN injection Inject 60 mg into the skin every 6 (six) months. Administer in upper arm, thigh, or abdomen   Yes [provider]  fish oil-omega-3 fatty acids 1000 MG capsule Take 1 g by mouth daily.   Yes [provider]  fluticasone (FLONASE) 50 MCG/ACT nasal spray Place 2 sprays into the nose daily.   Yes [provider]  Fluticasone Furoate (ARNUITY ELLIPTA IN) Inhale 1 puff into the lungs at bedtime.    Yes [provider]  Glycerin-Hypromellose-PEG 400 ([employer] DRY EYE RELIEF OP) Apply 1 drop to eye at bedtime as needed (dry eyes).   Yes [provider]  guaiFENesin (MUCINEX PO) Take 1 tablet by mouth at bedtime.   Yes  [provider]  IRON PO Take 1 tablet by mouth daily.   Yes [provider]  lamoTRIgine (LAMICTAL) 100 MG tablet TAKE 1 TABLET BY MOUTH IN  THE MORNING AND 2 TABLETS  AT BEDTIME. 06/28/18  Yes Cottle, Billey Co., MD  Multiple Vitamin (MULTIVITAMIN WITH MINERALS) TABS Take 1 tablet by mouth daily.   Yes [provider]  olmesartan (BENICAR) 40 MG tablet Take 40 mg by mouth daily.   Yes [provider]  pantoprazole (PROTONIX) 40 MG tablet Take 40 mg by mouth daily.   Yes [provider]  phenylephrine (SUDAFED PE) 10 MG TABS tablet Take 10 mg by mouth at bedtime.   Yes [provider]  promethazine (PHENERGAN) 25 MG tablet Take 25 mg by mouth every 6 (six) hours as needed. Nausea   Yes [provider]  tiZANidine (ZANAFLEX) 2 MG tablet Take 2 mg by mouth as needed (migraines).  06/28/18  Yes [provider]  topiramate (TOPAMAX) 200 MG tablet Take 200 mg by mouth 2 (two) times daily.    Yes [provider]  traZODone (DESYREL) 100 MG tablet TAKE 3 TABLETS BY MOUTH  EVERY NIGHT AT BEDTIME Patient taking differently: Take 300 mg by mouth at bedtime.  04/19/18  Yes Cottle, Billey Co., MD  zonisamide (ZONEGRAN) 100 MG capsule Take 300 mg by mouth daily.    Yes [provider]  HYDROcodone-acetaminophen (NORCO/VICODIN) 5-325 MG tablet Take 1 tablet by mouth every 6 (six) hours as needed for severe  pain. 07/12/18   Carmin Muskrat, MD     Vital Signs: BP 128/82 (BP Location: Left Arm)   Pulse (!) 58   Temp (!) 97.4 F (36.3 C) (Oral)   Resp 14   Ht 5' 5.5" (1.664 m)   Wt 149 lb 5 oz (67.7 kg)   SpO2 98%   BMI 24.47 kg/m   Physical Exam Vitals signs and nursing note reviewed.  Constitutional:      General: She is not in acute distress.    Appearance: Normal appearance.  Pulmonary:     Effort: Pulmonary effort is normal. No respiratory distress.  Musculoskeletal:     Comments: T12 and L1 incision  sites without tenderness, erythema, or drainage.  Skin:    General: Skin is warm and dry.  Neurological:     Mental Status: She is alert and oriented to person, place, and time.  Psychiatric:        Mood and Affect: Mood normal.        Behavior: Behavior normal.        Thought Content: Thought content normal.        Judgment: Judgment normal.     Imaging: Dg Thoracic Spine 4v  Result Date: 07/12/2018 CLINICAL DATA:  [place]. Pain. EXAM: LUMBAR SPINE - COMPLETE 4+ VIEW; THORACIC SPINE - 4+ VIEW COMPARISON:  None. FINDINGS: Alignment is normal. On the lateral radiographs, there is concern for slight wedging of T12, inferior endplate, suspicious for fracture. In addition, on the lateral radiograph, there is inferior endplate irregularity at L1, also concerning for fracture. Recommend CT of the lumbar spine to include the lower thoracic region up to T10 for confirmation. Lower lumbar facet arthropathy. Disc space narrowing at L4-5 and L5-S1. No rib fracture. No lung pathology. Pedicles are intact. IMPRESSION: Suspicion for mild T12 and L1 compression fractures. Recommend CT of the lumbar spine to include the lower thoracic region for confirmation. Electronically Signed   By: Staci Righter M.D.   On: 07/12/2018 12:25   Dg Lumbar Spine Complete  Result Date: 07/12/2018 CLINICAL DATA:  [place]. Pain. EXAM: LUMBAR SPINE - COMPLETE 4+ VIEW; THORACIC SPINE - 4+ VIEW COMPARISON:  None. FINDINGS: Alignment is normal. On the lateral radiographs, there is concern for slight wedging of T12, inferior endplate, suspicious for fracture. In addition, on the lateral radiograph, there is inferior endplate irregularity at L1, also concerning for fracture. Recommend CT of the lumbar spine to include the lower thoracic region up to T10 for confirmation. Lower lumbar facet arthropathy. Disc space narrowing at L4-5 and L5-S1. No rib fracture. No lung pathology. Pedicles are intact. IMPRESSION: Suspicion for mild T12 and L1  compression fractures. Recommend CT of the lumbar spine to include the lower thoracic region for confirmation. Electronically Signed   By: Staci Righter M.D.   On: 07/12/2018 12:25   Ct Lumbar Spine Wo Contrast  Result Date: 07/12/2018 CLINICAL DATA:  Syncope and fall. Low back pain. Thoracolumbar compression fractures on radiographs. EXAM: CT LUMBAR SPINE WITHOUT CONTRAST TECHNIQUE: Multidetector CT imaging of the lumbar spine was performed without intravenous contrast administration. Multiplanar CT image reconstructions were also generated. COMPARISON:  Thoracic and lumbar spine radiographs 07/12/2018 FINDINGS: Segmentation: 5 lumbar type vertebrae. Alignment: Normal. Vertebrae: Acute appearing inferior endplate compression fractures are present at T12 and L1 with 10-15% height loss. The L1 fracture involves the posterior vertebral body cortex with 3 mm of retropulsion. No convincing posterior element fracture is identified. No suspicious osseous lesion is seen. Bridging  osteophytes are noted across the anterosuperior aspects of the SI joints bilaterally. Paraspinal and other soft tissues: Marked asymmetric right renal atrophy. Disc levels: Lower lumbar disc degeneration with moderate disc space narrowing, degenerative endplate sclerosis and spurring, and vacuum disc at L4-5 and L5-S1. Mild spinal stenosis and mild-to-moderate bilateral neural foraminal stenosis at L4-5 due to disc bulging and mild posterior element hypertrophy. Mild bilateral neural foraminal stenosis at L5-S1. IMPRESSION: 1. Acute T12 and L1 compression fractures. 3 mm retropulsion at L1 without significant stenosis. 2. Lower lumbar disc and facet degeneration, most notable at L4-5 where there is mild spinal and mild-to-moderate bilateral neural foraminal stenosis. Electronically Signed   By: Logan Bores M.D.   On: 07/12/2018 15:45   Mr Brain Wo Contrast  Result Date: 07/12/2018 CLINICAL DATA:  Multiple syncopal episodes.  Fall. EXAM: MRI  HEAD WITHOUT CONTRAST TECHNIQUE: Multiplanar, multiecho pulse sequences of the brain and surrounding structures were obtained without intravenous contrast. COMPARISON:  None. FINDINGS: Brain: The diffusion-weighted images demonstrate no evidence for acute or subacute infarction. Study is mildly degraded by patient motion. No significant white matter lesions are present. The ventricles are of normal size. No significant extraaxial fluid collection is present. The internal auditory canals are within normal limits. The brainstem and cerebellum are within normal limits. Vascular: Flow is present in the major intracranial arteries. Skull and upper cervical spine: The craniocervical junction is normal. Upper cervical spine is within normal limits. Marrow signal is unremarkable. Sinuses/Orbits: The paranasal sinuses and mastoid air cells are clear. Right lens replacement is present. Globes and orbits are otherwise normal. IMPRESSION: 1. Normal MRI appearance of the brain for age. No acute or focal lesion to explain syncopal episodes. Electronically Signed   By: San Morelle M.D.   On: 07/12/2018 21:06   Mr Thoracic Spine Wo Contrast  Result Date: 07/12/2018 CLINICAL DATA:  Initial evaluation for acute vertebral body fractures. EXAM: MRI THORACIC AND LUMBAR SPINE WITHOUT CONTRAST TECHNIQUE: Multiplanar and multiecho pulse sequences of the thoracic and lumbar spine were obtained without intravenous contrast. COMPARISON:  Prior CT from earlier the same day. FINDINGS: MRI THORACIC SPINE FINDINGS Alignment: Vertebral bodies normally aligned with preservation of the normal thoracic kyphosis. No listhesis. Vertebrae: Acute compression fractures involving the inferior endplates of F81 and L1, described on corresponding lumbar spine portion of this exam. Vertebral body heights otherwise maintained without evidence for acute or chronic fracture. Underlying bone marrow signal intensity within normal limits. Few small  benign hemangiomas noted, most prominent of which in the T11 vertebral body. No worrisome osseous lesions. No other abnormal marrow edema. Cord:  Signal intensity within the thoracic spinal cord is normal. Paraspinal and other soft tissues: Paraspinous soft tissues demonstrate no acute finding. Small layering bilateral pleural effusions noted. Cholelithiasis. Asymmetric right renal atrophy noted. Disc levels: No significant disc pathology seen within the thoracic spine. No canal or neural foraminal stenosis. MRI LUMBAR SPINE FINDINGS Segmentation: Standard. Lowest well-formed disc labeled the L5-S1 level. Alignment: Trace 2 mm retrolisthesis of L5 on S1. Alignment otherwise normal with preservation of the normal lumbar lordosis. Vertebrae: Acute compression fracture extends through the inferior endplate of O17. Associated mild central height loss of up to 25% without bony retropulsion. Additional acute compression fracture extends through the inferior endplate of L1 with up to 25% central height loss with minimal 3 mm bony retropulsion. These are benign/mechanical in appearance. Vertebral body heights otherwise maintained without evidence for acute or chronic fracture. Underlying bone marrow signal intensity within  normal limits. Few small benign hemangiomas noted. No worrisome osseous lesions. Reactive endplate changes noted about the L4-5 and L5-S1 interspaces. Mild edema about the left L3-4 facet due to facet arthritis. No other abnormal marrow edema. Conus medullaris and cauda equina: Conus extends to the L2 level. Conus and cauda equina appear normal. Paraspinal and other soft tissues: Paraspinous soft tissues demonstrate no acute finding. No other vessel oral abnormality. Disc levels: L1-2: Trace 3 mm bony retropulsion related to the L1 compression fracture. Minimal flattening of the ventral thecal sac without stenosis. Foramina remain patent. L2-3: Disc desiccation without significant disc bulge. No stenosis  or impingement. L3-4: Minimal annular disc bulge with disc desiccation. Mild facet and ligament flavum hypertrophy. No significant canal or foraminal stenosis. L4-5: Chronic intervertebral disc space narrowing with diffuse disc bulge and disc desiccation. Associated reactive endplate changes. Mild facet and ligament flavum hypertrophy. Resultant moderate canal with bilateral subarticular stenosis. Mild left with mild to moderate right L4 foraminal narrowing. L5-S1: Trace retrolisthesis. Diffuse degenerative disc bulge with disc desiccation and intervertebral disc space narrowing. Chronic reactive endplate changes with marginal endplate osteophytic spurring. Mild bilateral facet hypertrophy. No significant canal or lateral recess stenosis. Mild bilateral L5 foraminal narrowing. IMPRESSION: 1. Acute compression fractures involving the inferior endplates of N23 and L1 with up to 25% central height loss. Trace 3 mm bony retropulsion at L1 without associated stenosis. No significant bony retropulsion or stenosis at T12. 2. Multifactorial degenerative changes at L4-5 with resultant moderate canal with bilateral subarticular stenosis, with mild to moderate right greater than left L4 foraminal narrowing. 3. Degenerative spondylolysis at L5-S1 with resultant mild bilateral L5 foraminal stenosis. 4. Small bilateral pleural effusions. Electronically Signed   By: Jeannine Boga M.D.   On: 07/12/2018 21:47   Mr Lumbar Spine Wo Contrast  Result Date: 07/12/2018 CLINICAL DATA:  Initial evaluation for acute vertebral body fractures. EXAM: MRI THORACIC AND LUMBAR SPINE WITHOUT CONTRAST TECHNIQUE: Multiplanar and multiecho pulse sequences of the thoracic and lumbar spine were obtained without intravenous contrast. COMPARISON:  Prior CT from earlier the same day. FINDINGS: MRI THORACIC SPINE FINDINGS Alignment: Vertebral bodies normally aligned with preservation of the normal thoracic kyphosis. No listhesis. Vertebrae: Acute  compression fractures involving the inferior endplates of F57 and L1, described on corresponding lumbar spine portion of this exam. Vertebral body heights otherwise maintained without evidence for acute or chronic fracture. Underlying bone marrow signal intensity within normal limits. Few small benign hemangiomas noted, most prominent of which in the T11 vertebral body. No worrisome osseous lesions. No other abnormal marrow edema. Cord:  Signal intensity within the thoracic spinal cord is normal. Paraspinal and other soft tissues: Paraspinous soft tissues demonstrate no acute finding. Small layering bilateral pleural effusions noted. Cholelithiasis. Asymmetric right renal atrophy noted. Disc levels: No significant disc pathology seen within the thoracic spine. No canal or neural foraminal stenosis. MRI LUMBAR SPINE FINDINGS Segmentation: Standard. Lowest well-formed disc labeled the L5-S1 level. Alignment: Trace 2 mm retrolisthesis of L5 on S1. Alignment otherwise normal with preservation of the normal lumbar lordosis. Vertebrae: Acute compression fracture extends through the inferior endplate of D22. Associated mild central height loss of up to 25% without bony retropulsion. Additional acute compression fracture extends through the inferior endplate of L1 with up to 25% central height loss with minimal 3 mm bony retropulsion. These are benign/mechanical in appearance. Vertebral body heights otherwise maintained without evidence for acute or chronic fracture. Underlying bone marrow signal intensity within normal limits. Few small  benign hemangiomas noted. No worrisome osseous lesions. Reactive endplate changes noted about the L4-5 and L5-S1 interspaces. Mild edema about the left L3-4 facet due to facet arthritis. No other abnormal marrow edema. Conus medullaris and cauda equina: Conus extends to the L2 level. Conus and cauda equina appear normal. Paraspinal and other soft tissues: Paraspinous soft tissues  demonstrate no acute finding. No other vessel oral abnormality. Disc levels: L1-2: Trace 3 mm bony retropulsion related to the L1 compression fracture. Minimal flattening of the ventral thecal sac without stenosis. Foramina remain patent. L2-3: Disc desiccation without significant disc bulge. No stenosis or impingement. L3-4: Minimal annular disc bulge with disc desiccation. Mild facet and ligament flavum hypertrophy. No significant canal or foraminal stenosis. L4-5: Chronic intervertebral disc space narrowing with diffuse disc bulge and disc desiccation. Associated reactive endplate changes. Mild facet and ligament flavum hypertrophy. Resultant moderate canal with bilateral subarticular stenosis. Mild left with mild to moderate right L4 foraminal narrowing. L5-S1: Trace retrolisthesis. Diffuse degenerative disc bulge with disc desiccation and intervertebral disc space narrowing. Chronic reactive endplate changes with marginal endplate osteophytic spurring. Mild bilateral facet hypertrophy. No significant canal or lateral recess stenosis. Mild bilateral L5 foraminal narrowing. IMPRESSION: 1. Acute compression fractures involving the inferior endplates of F02 and L1 with up to 25% central height loss. Trace 3 mm bony retropulsion at L1 without associated stenosis. No significant bony retropulsion or stenosis at T12. 2. Multifactorial degenerative changes at L4-5 with resultant moderate canal with bilateral subarticular stenosis, with mild to moderate right greater than left L4 foraminal narrowing. 3. Degenerative spondylolysis at L5-S1 with resultant mild bilateral L5 foraminal stenosis. 4. Small bilateral pleural effusions. Electronically Signed   By: Jeannine Boga M.D.   On: 07/12/2018 21:47   Ir Kypho Lumbar Inc Fx Reduce Bone Bx Uni/bil Cannulation Inc/imaging  Result Date: 07/14/2018 INDICATION: 68 year old female status post fall with acute and highly symptomatic fractures of the inferior endplates of  D74 and L1. She presents for cement augmentation with kyphoplasty. MEDICATIONS: As antibiotic prophylaxis, 2 g Ancef was ordered pre-procedure and administered intravenously within 1 hour of incision. ANESTHESIA/SEDATION: Moderate (conscious) sedation was employed during this procedure. A total of Versed 5.5 mg and Fentanyl 200 mcg was administered intravenously. Moderate Sedation Time: 32 minutes. The patient's level of consciousness and vital signs were monitored continuously by radiology nursing throughout the procedure under my direct supervision. FLUOROSCOPY TIME:  Fluoroscopy Time: 8 minutes 18 seconds (128 mGy) COMPLICATIONS: None immediate. PROCEDURE: Following a full explanation of the procedure along with the potential associated complications, an informed witnessed consent was obtained. The patient was placed prone on the fluoroscopic table. The skin overlying the thoracolumbar region was then prepped and draped in the usual sterile fashion. The left pedicle at T12 was then infiltrated with 0.25% bupivacaine followed by the advancement of an 11-gauge Jamshidi needle through the left pedicle into the posterior one-third at T12. This was then exchanged for a Kyphon advanced osteo introducer system comprised of a working cannula and a Kyphon osteo drill. This combination was then advanced over a Kyphon osteo bone pin until the tip of the Kyphon osteo drill was in the posterior third at T12. At this time, the bone pin was removed. In a medial trajectory, the combination was advanced until the tip of the working cannula was inside the posterior one-third at T12. Through the working cannula, a Kyphon inflatable bone tamp 15 x 20 was advanced and positioned with the distal marker 5 mm from the  anterior aspect of T12. Crossing of the midline was seen on the AP projection. At this time, the balloon was expanded using contrast via a Kyphon inflation syringe device via microtubing. Inflations were continued until  there was apposition with the inferior endplate. Attention was next turned to the L1 vertebral body. Using similar technique, a second 11 gauge trocar needle was advanced through the left pedicle of L1 and into the posterior 1/3 of L1. The osteo drill was then advanced through the vertebral body to within 5 mm of the anterior cortex. Through the working cannula, a second kypho on inflatable balloon tamponade 15 x 20 was advanced and position with the distal marker 5 mm from the anterior aspect of L1. Crossing of the midline was noted on the AP projection. At this time, the balloon was expanded using contrast via a Kyphon inflation syringe device. At this time, methylmethacrylate mixture was reconstituted with Tobramycin in the Kyphon bone mixing device system. This was then loaded onto the Kyphon bone fillers. The balloon at T12 was deflated and removed followed by the instillation of methylmethacrylate mixture with excellent filling in the AP and lateral projections. No extravasation was noted in the disk spaces or posteriorly into the spinal canal. No epidural venous contamination was seen. The balloon at L1 was then deflated and removed followed by installation of methylmethacrylate mixture with excellent filling in the AP and lateral projections. No extravasation was noted in the disc spaces or posteriorly into the spinal canal. No epidural venous contamination was seen. The working cannula and the bone filler were then retrieved and removed. IMPRESSION: 1. Status post vertebral body augmentation using balloon kyphoplasty at T12 as described without event. 2. Status post vertebral body augmentation using balloon kyphoplasty at L1 as described without event. Signed, Criselda Peaches, MD, [facility] Radiology Electronically Signed   By: Jacqulynn Cadet M.D.   On: 07/14/2018 16:46   Ir Kypho Ea Addl Level Thoracic Or Lumbar  Result Date:  07/14/2018 INDICATION: 68 year old female status post fall with acute and highly symptomatic fractures of the inferior endplates of S06 and L1. She presents for cement augmentation with kyphoplasty. MEDICATIONS: As antibiotic prophylaxis, 2 g Ancef was ordered pre-procedure and administered intravenously within 1 hour of incision. ANESTHESIA/SEDATION: Moderate (conscious) sedation was employed during this procedure. A total of Versed 5.5 mg and Fentanyl 200 mcg was administered intravenously. Moderate Sedation Time: 32 minutes. The patient's level of consciousness and vital signs were monitored continuously by radiology nursing throughout the procedure under my direct supervision. FLUOROSCOPY TIME:  Fluoroscopy Time: 8 minutes 18 seconds (301 mGy) COMPLICATIONS: None immediate. PROCEDURE: Following a full explanation of the procedure along with the potential associated complications, an informed witnessed consent was obtained. The patient was placed prone on the fluoroscopic table. The skin overlying the thoracolumbar region was then prepped and draped in the usual sterile fashion. The left pedicle at T12 was then infiltrated with 0.25% bupivacaine followed by the advancement of an 11-gauge Jamshidi needle through the left pedicle into the posterior one-third at T12. This was then exchanged for a Kyphon advanced osteo introducer system comprised of a working cannula and a Kyphon osteo drill. This combination was then advanced over a Kyphon osteo bone pin until the tip of the Kyphon osteo drill was in the posterior third at T12. At this time, the bone pin was removed. In a medial trajectory, the combination was advanced until the tip of the working cannula was  inside the posterior one-third at T12. Through the working cannula, a Kyphon inflatable bone tamp 15 x 20 was advanced and positioned with the distal marker 5 mm from the anterior aspect of T12. Crossing of the midline was seen on the AP projection. At this  time, the balloon was expanded using contrast via a Kyphon inflation syringe device via microtubing. Inflations were continued until there was apposition with the inferior endplate. Attention was next turned to the L1 vertebral body. Using similar technique, a second 11 gauge trocar needle was advanced through the left pedicle of L1 and into the posterior 1/3 of L1. The osteo drill was then advanced through the vertebral body to within 5 mm of the anterior cortex. Through the working cannula, a second kypho on inflatable balloon tamponade 15 x 20 was advanced and position with the distal marker 5 mm from the anterior aspect of L1. Crossing of the midline was noted on the AP projection. At this time, the balloon was expanded using contrast via a Kyphon inflation syringe device. At this time, methylmethacrylate mixture was reconstituted with Tobramycin in the Kyphon bone mixing device system. This was then loaded onto the Kyphon bone fillers. The balloon at T12 was deflated and removed followed by the instillation of methylmethacrylate mixture with excellent filling in the AP and lateral projections. No extravasation was noted in the disk spaces or posteriorly into the spinal canal. No epidural venous contamination was seen. The balloon at L1 was then deflated and removed followed by installation of methylmethacrylate mixture with excellent filling in the AP and lateral projections. No extravasation was noted in the disc spaces or posteriorly into the spinal canal. No epidural venous contamination was seen. The working cannula and the bone filler were then retrieved and removed. IMPRESSION: 1. Status post vertebral body augmentation using balloon kyphoplasty at T12 as described without event. 2. Status post vertebral body augmentation using balloon kyphoplasty at L1 as described without event. Signed, Criselda Peaches, MD, [facility] Electronically  Signed   By: Jacqulynn Cadet M.D.   On: 07/14/2018 16:46    Labs:  CBC: Recent Labs    07/12/18 1107 07/13/18 0610 07/14/18 0653  WBC 9.6 5.0 5.5  HGB 12.1 10.6* 10.4*  HCT 40.2 35.4* 34.4*  PLT 166 141* 142*    COAGS: Recent Labs    07/13/18 1012  INR 1.05    BMP: Recent Labs    07/12/18 1107 07/13/18 0610 07/14/18 0653 07/15/18 0559  NA 140 140 142 141  K 4.2 3.3* 3.7 4.0  CL 113* 113* 117* 114*  CO2 22 21* 20* 23  GLUCOSE 132* 103* 101* 95  BUN 29* 19 17 15   CALCIUM 9.6 8.8* 9.0 9.2  CREATININE 1.63* 1.38* 1.34* 1.39*  GFRNONAA 32* 39* 41* 39*  GFRAA 37* 46* 47* 45*    LIVER FUNCTION TESTS: Recent Labs    07/13/18 0610 07/14/18 0653  BILITOT 0.5  --   AST 21  --   ALT 20  --   ALKPHOS 61  --   PROT 6.0*  --   ALBUMIN 3.6 3.3*    Assessment and Plan:  T12 and L1 compression fractures s/p kyphoplasty 07/14/2018 by Dr. Laurence Ferrari. Patient's T12 and L1 incision sites stable. [place] for patient to shower at this time. No submerging (bathing, swimming) for 48 hours. Please call IR with questions/concerns.   Electronically Signed: Earley Abide, PA-C 07/15/2018, 11:47 AM   I spent a  total of 25 Minutes at the the patient's bedside AND on the patient's hospital floor or unit, greater than 50% of which was counseling/coordinating care for T12 and L1 compression fractures s/p kyphoplasty.

## 2018-07-15 NOTE — Evaluation (Signed)
Physical Therapy Evaluation-1x Patient Details Name: Serria Sloma Spradley MRN: [id number] DOB: 1950/10/23 Today's Date: 07/15/2018   History of Present Illness  68 yo female admitted with syncopal episodes, fall at home, T12/L1 compression fxs. S/P T12/L1 KP 07/14/18. Hx of bipolar d/o, depression, anxiety, syncope, falls.   Clinical Impression  On eval, pt was Supervision level for mobility. She walked ~150 feet without a device. Pain rated 5/10 with activity. Pt denied dizziness. BP after walking: 145/92. Do not anticipate any further PT needs at this time. Instructed pt to f/u with PCP for OP PT referral if she notices any persistent weakness or difficulty with mobility. Recommend daily ambulation in the hallway with nursing supervision. Unsure of continued need for TLSO since pt is s/p KP. 1x eval. Will sign off.     Follow Up Recommendations No PT follow up;Supervision - Intermittent    Equipment Recommendations  None recommended by PT    Recommendations for Other Services       Precautions / Restrictions Precautions Precautions: Fall Restrictions Weight Bearing Restrictions: No      Mobility  Bed Mobility Overal bed mobility: Modified Independent                Transfers Overall transfer level: Modified independent                  Ambulation/Gait Ambulation/Gait assistance: Supervision Gait Distance (Feet): 150 Feet Assistive device: None Gait Pattern/deviations: Step-through pattern;Decreased stride length     General Gait Details: slow but steady gait speed. No LOB. Pt denied dizziness. BP after walking: 145/92  Stairs            Wheelchair Mobility    Modified Rankin (Stroke Patients Only)       Balance Overall balance assessment: Mild deficits observed, not formally tested                                           Pertinent Vitals/Pain Pain Assessment: 0-10 Pain Score: 5  Pain Location: back Pain Descriptors /  Indicators: Discomfort;Sore Pain Intervention(s): Monitored during session;Repositioned    Home Living Family/patient expects to be discharged to:: Private residence Living Arrangements: Children Available Help at Discharge: Family;Available PRN/intermittently Type of Home: House       Home Layout: Two level;Bed/bath upstairs Home Equipment: None      Prior Function Level of Independence: Independent         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        Extremity/Trunk Assessment   Upper Extremity Assessment Upper Extremity Assessment: Overall WFL for tasks assessed    Lower Extremity Assessment Lower Extremity Assessment: Overall WFL for tasks assessed    Cervical / Trunk Assessment Cervical / Trunk Assessment: Normal  Communication   Communication: No difficulties  Cognition Arousal/Alertness: Awake/alert Behavior During Therapy: WFL for tasks assessed/performed Overall Cognitive Status: Within Functional Limits for tasks assessed                                        General Comments      Exercises     Assessment/Plan    PT Assessment Patent does not need any further PT services  PT Problem List         PT  Treatment Interventions      PT Goals (Current goals can be found in the Care Plan section)  Acute Rehab PT Goals Patient Stated Goal: regain PLOF PT Goal Formulation: All assessment and education complete, DC therapy    Frequency     Barriers to discharge        Co-evaluation               AM-PAC PT "6 Clicks" Mobility  Outcome Measure Help needed turning from your back to your side while in a flat bed without using bedrails?: None Help needed moving from lying on your back to sitting on the side of a flat bed without using bedrails?: None Help needed moving to and from a bed to a chair (including a wheelchair)?: None Help needed standing up from a chair using your arms (e.g., wheelchair or bedside chair)?:  None Help needed to walk in hospital room?: A Little Help needed climbing 3-5 steps with a railing? : A Little 6 Click Score: 22    End of Session Equipment Utilized During Treatment: Gait belt Activity Tolerance: Patient tolerated treatment well Patient left: in bed;with call bell/phone within reach        Time: 0957-1014 PT Time Calculation (min) (ACUTE ONLY): 17 min   Charges:   PT Evaluation $PT Eval Moderate Complexity: [place], PT Acute Rehabilitation Services Pager: [phone] Office: [phone]

## 2018-07-15 NOTE — Progress Notes (Signed)
  Echocardiogram 2D Echocardiogram has been performed.  Kelli Morales M 07/15/2018, 11:44 AM

## 2018-07-19 ENCOUNTER — Other Ambulatory Visit: Payer: Self-pay | Admitting: Interventional Radiology

## 2018-07-19 DIAGNOSIS — S32000A Wedge compression fracture of unspecified lumbar vertebra, initial encounter for closed fracture: Secondary | ICD-10-CM

## 2018-07-19 DIAGNOSIS — S22000A Wedge compression fracture of unspecified thoracic vertebra, initial encounter for closed fracture: Secondary | ICD-10-CM

## 2018-07-20 ENCOUNTER — Other Ambulatory Visit: Payer: Self-pay | Admitting: Interventional Radiology

## 2018-07-20 ENCOUNTER — Ambulatory Visit
Admission: RE | Admit: 2018-07-20 | Discharge: 2018-07-20 | Disposition: A | Discharge status: Home / Self Care | Payer: Medicare Other | Source: Ambulatory Visit | Attending: Interventional Radiology | Admitting: Interventional Radiology

## 2018-07-20 ENCOUNTER — Ambulatory Visit: Payer: Self-pay | Admitting: Psychiatry

## 2018-07-20 DIAGNOSIS — M545 Low back pain, unspecified: Secondary | ICD-10-CM

## 2018-07-20 DIAGNOSIS — S32000A Wedge compression fracture of unspecified lumbar vertebra, initial encounter for closed fracture: Secondary | ICD-10-CM

## 2018-07-20 DIAGNOSIS — S22000A Wedge compression fracture of unspecified thoracic vertebra, initial encounter for closed fracture: Secondary | ICD-10-CM

## 2018-07-20 HISTORY — PX: IR RADIOLOGIST EVAL & MGMT: IMG5224

## 2018-07-20 NOTE — Progress Notes (Signed)
Chief Complaint: Back Pain  Referring Physician(s): Dr. Verlon Au  History of Present Illness: Kelli Morales is a 68 y.o. female presenting to [place] [facility] today as requested by the patient for follow up for ongoing back pain after vertebral augmentation.   She was admitted to [facility] last week after a fall from fainting, and was found to have fractures of T12 and L1.    She was treated with vertebral augmentation on 07/14/2018.  She was discharged home the following day.   She is here today with her aunt.  She tells me that when she got home, she remembers having a day or so of relief, and then the pain came back to almost similar level to that she experienced after the fall.  She places the original pain at 8-9/10 intensity, and currently she tells me it is 7-8/10 intensity.  The quality is sharp/stabbing.  She has been taking only tylenol for medication, with minimal relief.  She has some concerns for taking opioid medications.  She denies any associated symptoms such as referred pain or new leg weakness.  She denies fevers/rigors/chills.   She has had no interval fall or trauma.    Past Medical History:  Diagnosis Date  . Anxiety   . Asthma   . Bipolar disorder ([place])   . Cataracts, bilateral   . Depression   . GERD (gastroesophageal reflux disease)   . Hydatid cyst    Left  . Hypertension   . Kidney problem    RT kidney is smaller -> decreased output  . Migraine   . Osteoporosis 11/2016   T score -2.8  improved from prior DEXA  . Right rotator cuff tear     Past Surgical History:  Procedure Laterality Date  . BUNIONECTOMY     L foot 07/2011 --RT foot 10/2011  . CATARACT EXTRACTION    . Fimbrioplasty-bilateral-Lysis of adhesions-Excision of Left ovarian cyst  1986  . Hydatid cyst     Left-Adhesions  . IR KYPHO EA ADDL LEVEL THORACIC OR LUMBAR  07/14/2018  . IR KYPHO LUMBAR INC FX REDUCE BONE BX UNI/BIL CANNULATION INC/IMAGING  07/14/2018  . IR RADIOLOGIST EVAL & MGMT   07/20/2018  . ROTATOR CUFF REPAIR     LEFT  . SHOULDER OPEN ROTATOR CUFF REPAIR Right 08/26/2012   Procedure: RIGHT SHOULDER MINI OPEN ROTATOR CUFF REPAIR POSSIBLE PATCH GRAFT AND SUBACROMIAL DECOMPRESSION;  Surgeon: Johnn Hai, MD;  Location: [facility];  Service: Orthopedics;  Laterality: Right;    Allergies: Divalproex sodium; Geodon [ziprasidone hydrochloride]; Nsaids; and Seroquel [quetiapine fumerate]  Medications: Prior to Admission medications   Medication Sig Start Date End Date Taking? Authorizing Provider  Ascorbic Acid (VITAMIN C PO) Take 1 tablet by mouth daily.    [provider]  aspirin 81 MG tablet Take 325 mg by mouth 3 (three) times a week. Min, Wed, Fri    [provider]  CALCIUM PO Take 1 tablet by mouth daily.    [provider]  cholecalciferol (VITAMIN D) 1000 UNITS tablet Take 1,000 Units by mouth daily.    [provider]  clonazePAM (KLONOPIN) 0.5 MG tablet Take 1 tablet (0.5 mg total) by mouth 3 (three) times daily as needed for anxiety (and insomnia). 04/15/18   Cottle, Billey Co., MD  denosumab (PROLIA) 60 MG/ML SOLN injection Inject 60 mg into the skin every 6 (six) months. Administer in upper arm, thigh, or abdomen    [provider]  docusate sodium (COLACE) 100 MG capsule Take 1 capsule (100 mg total) by mouth 2 (two) times daily. 07/15/18   Hongalgi, Lenis Dickinson, MD  fish oil-omega-3 fatty acids 1000 MG capsule Take 1 g by mouth daily.    [provider]  fluticasone (FLONASE) 50 MCG/ACT nasal spray Place 2 sprays into the nose daily.    [provider]  Fluticasone Furoate (ARNUITY ELLIPTA IN) Inhale 1 puff into the lungs at bedtime.     [provider]  Glycerin-Hypromellose-PEG 400 ([facility] DRY EYE RELIEF OP) Apply 1 drop to eye at bedtime as needed (dry eyes).    [provider]  guaiFENesin (MUCINEX PO) Take 1 tablet by mouth at bedtime.    [provider]    HYDROcodone-acetaminophen (NORCO/VICODIN) 5-325 MG tablet Take 1 tablet by mouth every 6 (six) hours as needed for severe pain. 07/12/18   Carmin Muskrat, MD  IRON PO Take 1 tablet by mouth daily.    [provider]  lamoTRIgine (LAMICTAL) 100 MG tablet TAKE 1 TABLET BY MOUTH IN  THE MORNING AND 2 TABLETS  AT BEDTIME. 06/28/18   Cottle, Billey Co., MD  Multiple Vitamin (MULTIVITAMIN WITH MINERALS) TABS Take 1 tablet by mouth daily.    [provider]  olmesartan (BENICAR) 40 MG tablet Take 40 mg by mouth daily.    [provider]  pantoprazole (PROTONIX) 40 MG tablet Take 40 mg by mouth daily.    [provider]  phenylephrine (SUDAFED PE) 10 MG TABS tablet Take 10 mg by mouth at bedtime.    [provider]  promethazine (PHENERGAN) 25 MG tablet Take 25 mg by mouth every 6 (six) hours as needed. Nausea    [provider]  tiZANidine (ZANAFLEX) 2 MG tablet Take 2 mg by mouth as needed (migraines).  06/28/18   [provider]  topiramate (TOPAMAX) 200 MG tablet Take 200 mg by mouth 2 (two) times daily.     [provider]  traZODone (DESYREL) 100 MG tablet TAKE 3 TABLETS BY MOUTH  EVERY NIGHT AT BEDTIME Patient taking differently: Take 300 mg by mouth at bedtime.  04/19/18   Cottle, Billey Co., MD  zonisamide (ZONEGRAN) 100 MG capsule Take 300 mg by mouth daily.     [provider]     Family History  Problem Relation Age of Onset  . Diabetes Father   . Hypertension Father   . Heart disease Father   . Stroke Father   . Cancer Father        Lymphoma  . Hypertension Mother   . Heart disease Mother   . Stroke Mother   . Stroke Paternal Uncle     Social History   Socioeconomic History  . Marital status: Divorced    Spouse name: Not on file  . Number of children: 1  . Years of education: MSN  . Highest education level: Not on file  Occupational History  . Not on file  Social Needs  . Financial resource  strain: Not on file  . Food insecurity:    Worry: Not on file    Inability: Not on file  . Transportation needs:    Medical: Not on file    Non-medical: Not on file  Tobacco Use  . Smoking status: Never Smoker  . Smokeless tobacco: Never Used  Substance and Sexual Activity  . Alcohol use: Yes    Alcohol/week: 0.0 standard drinks    Comment: Rare  .  Drug use: No  . Sexual activity: Not Currently    Birth control/protection: Post-menopausal    Comment: 1st intercourse 59 yo-2 partners  Lifestyle  . Physical activity:    Days per week: Not on file    Minutes per session: Not on file  . Stress: Not on file  Relationships  . Social connections:    Talks on phone: Not on file    Gets together: Not on file    Attends religious service: Not on file    Active member of club or organization: Not on file    Attends meetings of clubs or organizations: Not on file    Relationship status: Not on file  Other Topics Concern  . Not on file  Social History Narrative   Lives with husband and daughter   Caffeine use: 2 cups per day   Right handed      Review of Systems: A 12 point ROS discussed and pertinent positives are indicated in the HPI above.  All other systems are negative.  Review of Systems  Vital Signs: BP 129/69   Pulse 66   Temp 98.2 F (36.8 C) (Oral)   Resp 14   Ht 5\' 5"  (1.651 m)   Wt 65.8 kg   SpO2 99%   BMI 24.13 kg/m   Physical Exam General: 68 yo female appearing  stated age.  Well-developed, well-nourished.  No distress. HEENT: Atraumatic, normocephalic.  Conjugate gaze, extra-ocular motor intact. No scleral icterus or scleral injection. No lesions on external ears, nose, lips, or gums.  Oral mucosa moist, pink.  Neck: Symmetric with no goiter enlargement.  Chest/Lungs:  Symmetric chest with inspiration/expiration.  No labored breathing.  Clear to auscultation with no wheezes, rhonchi, or rales.  Heart:  RRR, with no third heart sounds appreciated. No JVD  appreciated.  Abdomen:  Soft, NT/ND, with + bowel sounds.   Genito-urinary: Deferred Neurologic: Alert & Oriented to person, place, and time.   Normal affect and insight.  Appropriate questions.  Moving all 4 extremities with gross sensory intact.  MSK: she has well-healed incisions of the left of midline on the lower back with no concern for infection.  She has more TTP on the left htan the right of midline.  Reproducible TTP at the treated levels. No other region of pain.  Mallampati Score:     Imaging: Dg Thoracic Spine 4v  Result Date: 07/12/2018 CLINICAL DATA:  [place]. Pain. EXAM: LUMBAR SPINE - COMPLETE 4+ VIEW; THORACIC SPINE - 4+ VIEW COMPARISON:  None. FINDINGS: Alignment is normal. On the lateral radiographs, there is concern for slight wedging of T12, inferior endplate, suspicious for fracture. In addition, on the lateral radiograph, there is inferior endplate irregularity at L1, also concerning for fracture. Recommend CT of the lumbar spine to include the lower thoracic region up to T10 for confirmation. Lower lumbar facet arthropathy. Disc space narrowing at L4-5 and L5-S1. No rib fracture. No lung pathology. Pedicles are intact. IMPRESSION: Suspicion for mild T12 and L1 compression fractures. Recommend CT of the lumbar spine to include the lower thoracic region for confirmation. Electronically Signed   By: Staci Righter M.D.   On: 07/12/2018 12:25   Dg Lumbar Spine Complete  Result Date: 07/12/2018 CLINICAL DATA:  [place]. Pain. EXAM: LUMBAR SPINE - COMPLETE 4+ VIEW; THORACIC SPINE - 4+ VIEW COMPARISON:  None. FINDINGS: Alignment is normal. On the lateral radiographs, there is concern for slight wedging of T12, inferior endplate, suspicious for fracture. In addition, on  the lateral radiograph, there is inferior endplate irregularity at L1, also concerning for fracture. Recommend CT of the lumbar spine to include the lower thoracic region up to T10 for confirmation. Lower lumbar facet  arthropathy. Disc space narrowing at L4-5 and L5-S1. No rib fracture. No lung pathology. Pedicles are intact. IMPRESSION: Suspicion for mild T12 and L1 compression fractures. Recommend CT of the lumbar spine to include the lower thoracic region for confirmation. Electronically Signed   By: Staci Righter M.D.   On: 07/12/2018 12:25   Ct Lumbar Spine Wo Contrast  Result Date: 07/12/2018 CLINICAL DATA:  Syncope and fall. Low back pain. Thoracolumbar compression fractures on radiographs. EXAM: CT LUMBAR SPINE WITHOUT CONTRAST TECHNIQUE: Multidetector CT imaging of the lumbar spine was performed without intravenous contrast administration. Multiplanar CT image reconstructions were also generated. COMPARISON:  Thoracic and lumbar spine radiographs 07/12/2018 FINDINGS: Segmentation: 5 lumbar type vertebrae. Alignment: Normal. Vertebrae: Acute appearing inferior endplate compression fractures are present at T12 and L1 with 10-15% height loss. The L1 fracture involves the posterior vertebral body cortex with 3 mm of retropulsion. No convincing posterior element fracture is identified. No suspicious osseous lesion is seen. Bridging osteophytes are noted across the anterosuperior aspects of the SI joints bilaterally. Paraspinal and other soft tissues: Marked asymmetric right renal atrophy. Disc levels: Lower lumbar disc degeneration with moderate disc space narrowing, degenerative endplate sclerosis and spurring, and vacuum disc at L4-5 and L5-S1. Mild spinal stenosis and mild-to-moderate bilateral neural foraminal stenosis at L4-5 due to disc bulging and mild posterior element hypertrophy. Mild bilateral neural foraminal stenosis at L5-S1. IMPRESSION: 1. Acute T12 and L1 compression fractures. 3 mm retropulsion at L1 without significant stenosis. 2. Lower lumbar disc and facet degeneration, most notable at L4-5 where there is mild spinal and mild-to-moderate bilateral neural foraminal stenosis. Electronically Signed   By:  Logan Bores M.D.   On: 07/12/2018 15:45   Mr Brain Wo Contrast  Result Date: 07/12/2018 CLINICAL DATA:  Multiple syncopal episodes.  Fall. EXAM: MRI HEAD WITHOUT CONTRAST TECHNIQUE: Multiplanar, multiecho pulse sequences of the brain and surrounding structures were obtained without intravenous contrast. COMPARISON:  None. FINDINGS: Brain: The diffusion-weighted images demonstrate no evidence for acute or subacute infarction. Study is mildly degraded by patient motion. No significant white matter lesions are present. The ventricles are of normal size. No significant extraaxial fluid collection is present. The internal auditory canals are within normal limits. The brainstem and cerebellum are within normal limits. Vascular: Flow is present in the major intracranial arteries. Skull and upper cervical spine: The craniocervical junction is normal. Upper cervical spine is within normal limits. Marrow signal is unremarkable. Sinuses/Orbits: The paranasal sinuses and mastoid air cells are clear. Right lens replacement is present. Globes and orbits are otherwise normal. IMPRESSION: 1. Normal MRI appearance of the brain for age. No acute or focal lesion to explain syncopal episodes. Electronically Signed   By: San Morelle M.D.   On: 07/12/2018 21:06   Mr Thoracic Spine Wo Contrast  Result Date: 07/12/2018 CLINICAL DATA:  Initial evaluation for acute vertebral body fractures. EXAM: MRI THORACIC AND LUMBAR SPINE WITHOUT CONTRAST TECHNIQUE: Multiplanar and multiecho pulse sequences of the thoracic and lumbar spine were obtained without intravenous contrast. COMPARISON:  Prior CT from earlier the same day. FINDINGS: MRI THORACIC SPINE FINDINGS Alignment: Vertebral bodies normally aligned with preservation of the normal thoracic kyphosis. No listhesis. Vertebrae: Acute compression fractures involving the inferior endplates of E52 and L1, described on corresponding lumbar spine  portion of this exam. Vertebral body  heights otherwise maintained without evidence for acute or chronic fracture. Underlying bone marrow signal intensity within normal limits. Few small benign hemangiomas noted, most prominent of which in the T11 vertebral body. No worrisome osseous lesions. No other abnormal marrow edema. Cord:  Signal intensity within the thoracic spinal cord is normal. Paraspinal and other soft tissues: Paraspinous soft tissues demonstrate no acute finding. Small layering bilateral pleural effusions noted. Cholelithiasis. Asymmetric right renal atrophy noted. Disc levels: No significant disc pathology seen within the thoracic spine. No canal or neural foraminal stenosis. MRI LUMBAR SPINE FINDINGS Segmentation: Standard. Lowest well-formed disc labeled the L5-S1 level. Alignment: Trace 2 mm retrolisthesis of L5 on S1. Alignment otherwise normal with preservation of the normal lumbar lordosis. Vertebrae: Acute compression fracture extends through the inferior endplate of Z61. Associated mild central height loss of up to 25% without bony retropulsion. Additional acute compression fracture extends through the inferior endplate of L1 with up to 25% central height loss with minimal 3 mm bony retropulsion. These are benign/mechanical in appearance. Vertebral body heights otherwise maintained without evidence for acute or chronic fracture. Underlying bone marrow signal intensity within normal limits. Few small benign hemangiomas noted. No worrisome osseous lesions. Reactive endplate changes noted about the L4-5 and L5-S1 interspaces. Mild edema about the left L3-4 facet due to facet arthritis. No other abnormal marrow edema. Conus medullaris and cauda equina: Conus extends to the L2 level. Conus and cauda equina appear normal. Paraspinal and other soft tissues: Paraspinous soft tissues demonstrate no acute finding. No other vessel oral abnormality. Disc levels: L1-2: Trace 3 mm bony retropulsion related to the L1 compression fracture.  Minimal flattening of the ventral thecal sac without stenosis. Foramina remain patent. L2-3: Disc desiccation without significant disc bulge. No stenosis or impingement. L3-4: Minimal annular disc bulge with disc desiccation. Mild facet and ligament flavum hypertrophy. No significant canal or foraminal stenosis. L4-5: Chronic intervertebral disc space narrowing with diffuse disc bulge and disc desiccation. Associated reactive endplate changes. Mild facet and ligament flavum hypertrophy. Resultant moderate canal with bilateral subarticular stenosis. Mild left with mild to moderate right L4 foraminal narrowing. L5-S1: Trace retrolisthesis. Diffuse degenerative disc bulge with disc desiccation and intervertebral disc space narrowing. Chronic reactive endplate changes with marginal endplate osteophytic spurring. Mild bilateral facet hypertrophy. No significant canal or lateral recess stenosis. Mild bilateral L5 foraminal narrowing. IMPRESSION: 1. Acute compression fractures involving the inferior endplates of W96 and L1 with up to 25% central height loss. Trace 3 mm bony retropulsion at L1 without associated stenosis. No significant bony retropulsion or stenosis at T12. 2. Multifactorial degenerative changes at L4-5 with resultant moderate canal with bilateral subarticular stenosis, with mild to moderate right greater than left L4 foraminal narrowing. 3. Degenerative spondylolysis at L5-S1 with resultant mild bilateral L5 foraminal stenosis. 4. Small bilateral pleural effusions. Electronically Signed   By: Jeannine Boga M.D.   On: 07/12/2018 21:47   Mr Lumbar Spine Wo Contrast  Result Date: 07/12/2018 CLINICAL DATA:  Initial evaluation for acute vertebral body fractures. EXAM: MRI THORACIC AND LUMBAR SPINE WITHOUT CONTRAST TECHNIQUE: Multiplanar and multiecho pulse sequences of the thoracic and lumbar spine were obtained without intravenous contrast. COMPARISON:  Prior CT from earlier the same day. FINDINGS:  MRI THORACIC SPINE FINDINGS Alignment: Vertebral bodies normally aligned with preservation of the normal thoracic kyphosis. No listhesis. Vertebrae: Acute compression fractures involving the inferior endplates of E45 and L1, described on corresponding lumbar spine portion of this  exam. Vertebral body heights otherwise maintained without evidence for acute or chronic fracture. Underlying bone marrow signal intensity within normal limits. Few small benign hemangiomas noted, most prominent of which in the T11 vertebral body. No worrisome osseous lesions. No other abnormal marrow edema. Cord:  Signal intensity within the thoracic spinal cord is normal. Paraspinal and other soft tissues: Paraspinous soft tissues demonstrate no acute finding. Small layering bilateral pleural effusions noted. Cholelithiasis. Asymmetric right renal atrophy noted. Disc levels: No significant disc pathology seen within the thoracic spine. No canal or neural foraminal stenosis. MRI LUMBAR SPINE FINDINGS Segmentation: Standard. Lowest well-formed disc labeled the L5-S1 level. Alignment: Trace 2 mm retrolisthesis of L5 on S1. Alignment otherwise normal with preservation of the normal lumbar lordosis. Vertebrae: Acute compression fracture extends through the inferior endplate of S50. Associated mild central height loss of up to 25% without bony retropulsion. Additional acute compression fracture extends through the inferior endplate of L1 with up to 25% central height loss with minimal 3 mm bony retropulsion. These are benign/mechanical in appearance. Vertebral body heights otherwise maintained without evidence for acute or chronic fracture. Underlying bone marrow signal intensity within normal limits. Few small benign hemangiomas noted. No worrisome osseous lesions. Reactive endplate changes noted about the L4-5 and L5-S1 interspaces. Mild edema about the left L3-4 facet due to facet arthritis. No other abnormal marrow edema. Conus medullaris  and cauda equina: Conus extends to the L2 level. Conus and cauda equina appear normal. Paraspinal and other soft tissues: Paraspinous soft tissues demonstrate no acute finding. No other vessel oral abnormality. Disc levels: L1-2: Trace 3 mm bony retropulsion related to the L1 compression fracture. Minimal flattening of the ventral thecal sac without stenosis. Foramina remain patent. L2-3: Disc desiccation without significant disc bulge. No stenosis or impingement. L3-4: Minimal annular disc bulge with disc desiccation. Mild facet and ligament flavum hypertrophy. No significant canal or foraminal stenosis. L4-5: Chronic intervertebral disc space narrowing with diffuse disc bulge and disc desiccation. Associated reactive endplate changes. Mild facet and ligament flavum hypertrophy. Resultant moderate canal with bilateral subarticular stenosis. Mild left with mild to moderate right L4 foraminal narrowing. L5-S1: Trace retrolisthesis. Diffuse degenerative disc bulge with disc desiccation and intervertebral disc space narrowing. Chronic reactive endplate changes with marginal endplate osteophytic spurring. Mild bilateral facet hypertrophy. No significant canal or lateral recess stenosis. Mild bilateral L5 foraminal narrowing. IMPRESSION: 1. Acute compression fractures involving the inferior endplates of N39 and L1 with up to 25% central height loss. Trace 3 mm bony retropulsion at L1 without associated stenosis. No significant bony retropulsion or stenosis at T12. 2. Multifactorial degenerative changes at L4-5 with resultant moderate canal with bilateral subarticular stenosis, with mild to moderate right greater than left L4 foraminal narrowing. 3. Degenerative spondylolysis at L5-S1 with resultant mild bilateral L5 foraminal stenosis. 4. Small bilateral pleural effusions. Electronically Signed   By: Jeannine Boga M.D.   On: 07/12/2018 21:47   Ir Kypho Lumbar Inc Fx Reduce Bone Bx Uni/bil Cannulation  Inc/imaging  Result Date: 07/14/2018 INDICATION: 68 year old female status post fall with acute and highly symptomatic fractures of the inferior endplates of J67 and L1. She presents for cement augmentation with kyphoplasty. MEDICATIONS: As antibiotic prophylaxis, 2 g Ancef was ordered pre-procedure and administered intravenously within 1 hour of incision. ANESTHESIA/SEDATION: Moderate (conscious) sedation was employed during this procedure. A total of Versed 5.5 mg and Fentanyl 200 mcg was administered intravenously. Moderate Sedation Time: 32 minutes. The patient's level of consciousness and vital signs were  monitored continuously by radiology nursing throughout the procedure under my direct supervision. FLUOROSCOPY TIME:  Fluoroscopy Time: 8 minutes 18 seconds (956 mGy) COMPLICATIONS: None immediate. PROCEDURE: Following a full explanation of the procedure along with the potential associated complications, an informed witnessed consent was obtained. The patient was placed prone on the fluoroscopic table. The skin overlying the thoracolumbar region was then prepped and draped in the usual sterile fashion. The left pedicle at T12 was then infiltrated with 0.25% bupivacaine followed by the advancement of an 11-gauge Jamshidi needle through the left pedicle into the posterior one-third at T12. This was then exchanged for a Kyphon advanced osteo introducer system comprised of a working cannula and a Kyphon osteo drill. This combination was then advanced over a Kyphon osteo bone pin until the tip of the Kyphon osteo drill was in the posterior third at T12. At this time, the bone pin was removed. In a medial trajectory, the combination was advanced until the tip of the working cannula was inside the posterior one-third at T12. Through the working cannula, a Kyphon inflatable bone tamp 15 x 20 was advanced and positioned with the distal marker 5 mm from the anterior aspect of T12. Crossing of the midline was seen on the  AP projection. At this time, the balloon was expanded using contrast via a Kyphon inflation syringe device via microtubing. Inflations were continued until there was apposition with the inferior endplate. Attention was next turned to the L1 vertebral body. Using similar technique, a second 11 gauge trocar needle was advanced through the left pedicle of L1 and into the posterior 1/3 of L1. The osteo drill was then advanced through the vertebral body to within 5 mm of the anterior cortex. Through the working cannula, a second kypho on inflatable balloon tamponade 15 x 20 was advanced and position with the distal marker 5 mm from the anterior aspect of L1. Crossing of the midline was noted on the AP projection. At this time, the balloon was expanded using contrast via a Kyphon inflation syringe device. At this time, methylmethacrylate mixture was reconstituted with Tobramycin in the Kyphon bone mixing device system. This was then loaded onto the Kyphon bone fillers. The balloon at T12 was deflated and removed followed by the instillation of methylmethacrylate mixture with excellent filling in the AP and lateral projections. No extravasation was noted in the disk spaces or posteriorly into the spinal canal. No epidural venous contamination was seen. The balloon at L1 was then deflated and removed followed by installation of methylmethacrylate mixture with excellent filling in the AP and lateral projections. No extravasation was noted in the disc spaces or posteriorly into the spinal canal. No epidural venous contamination was seen. The working cannula and the bone filler were then retrieved and removed. IMPRESSION: 1. Status post vertebral body augmentation using balloon kyphoplasty at T12 as described without event. 2. Status post vertebral body augmentation using balloon kyphoplasty at L1 as described without event. Signed, Criselda Peaches, MD, [facility]  Radiology Electronically Signed   By: Jacqulynn Cadet M.D.   On: 07/14/2018 16:46   Ir Kypho Ea Addl Level Thoracic Or Lumbar  Result Date: 07/14/2018 INDICATION: 68 year old female status post fall with acute and highly symptomatic fractures of the inferior endplates of O13 and L1. She presents for cement augmentation with kyphoplasty. MEDICATIONS: As antibiotic prophylaxis, 2 g Ancef was ordered pre-procedure and administered intravenously within 1 hour of incision. ANESTHESIA/SEDATION: Moderate (conscious) sedation was employed  during this procedure. A total of Versed 5.5 mg and Fentanyl 200 mcg was administered intravenously. Moderate Sedation Time: 32 minutes. The patient's level of consciousness and vital signs were monitored continuously by radiology nursing throughout the procedure under my direct supervision. FLUOROSCOPY TIME:  Fluoroscopy Time: 8 minutes 18 seconds (102 mGy) COMPLICATIONS: None immediate. PROCEDURE: Following a full explanation of the procedure along with the potential associated complications, an informed witnessed consent was obtained. The patient was placed prone on the fluoroscopic table. The skin overlying the thoracolumbar region was then prepped and draped in the usual sterile fashion. The left pedicle at T12 was then infiltrated with 0.25% bupivacaine followed by the advancement of an 11-gauge Jamshidi needle through the left pedicle into the posterior one-third at T12. This was then exchanged for a Kyphon advanced osteo introducer system comprised of a working cannula and a Kyphon osteo drill. This combination was then advanced over a Kyphon osteo bone pin until the tip of the Kyphon osteo drill was in the posterior third at T12. At this time, the bone pin was removed. In a medial trajectory, the combination was advanced until the tip of the working cannula was inside the posterior one-third at T12. Through the working cannula, a Kyphon inflatable bone tamp 15 x 20 was  advanced and positioned with the distal marker 5 mm from the anterior aspect of T12. Crossing of the midline was seen on the AP projection. At this time, the balloon was expanded using contrast via a Kyphon inflation syringe device via microtubing. Inflations were continued until there was apposition with the inferior endplate. Attention was next turned to the L1 vertebral body. Using similar technique, a second 11 gauge trocar needle was advanced through the left pedicle of L1 and into the posterior 1/3 of L1. The osteo drill was then advanced through the vertebral body to within 5 mm of the anterior cortex. Through the working cannula, a second kypho on inflatable balloon tamponade 15 x 20 was advanced and position with the distal marker 5 mm from the anterior aspect of L1. Crossing of the midline was noted on the AP projection. At this time, the balloon was expanded using contrast via a Kyphon inflation syringe device. At this time, methylmethacrylate mixture was reconstituted with Tobramycin in the Kyphon bone mixing device system. This was then loaded onto the Kyphon bone fillers. The balloon at T12 was deflated and removed followed by the instillation of methylmethacrylate mixture with excellent filling in the AP and lateral projections. No extravasation was noted in the disk spaces or posteriorly into the spinal canal. No epidural venous contamination was seen. The balloon at L1 was then deflated and removed followed by installation of methylmethacrylate mixture with excellent filling in the AP and lateral projections. No extravasation was noted in the disc spaces or posteriorly into the spinal canal. No epidural venous contamination was seen. The working cannula and the bone filler were then retrieved and removed. IMPRESSION: 1. Status post vertebral body augmentation using balloon kyphoplasty at T12 as described without event. 2. Status post vertebral body augmentation using balloon kyphoplasty at L1 as  described without event. Signed, Criselda Peaches, MD, [facility] Radiology Electronically Signed   By: Jacqulynn Cadet M.D.   On: 07/14/2018 16:46   Ir Radiologist Eval & Mgmt  Result Date: 07/20/2018 Please refer to notes tab for details about interventional procedure. (Op Note)   Labs:  CBC: Recent Labs    07/12/18 1107  07/13/18 0610 07/14/18 0653  WBC 9.6 5.0 5.5  HGB 12.1 10.6* 10.4*  HCT 40.2 35.4* 34.4*  PLT 166 141* 142*    COAGS: Recent Labs    07/13/18 1012  INR 1.05    BMP: Recent Labs    07/12/18 1107 07/13/18 0610 07/14/18 0653 07/15/18 0559  NA 140 140 142 141  K 4.2 3.3* 3.7 4.0  CL 113* 113* 117* 114*  CO2 22 21* 20* 23  GLUCOSE 132* 103* 101* 95  BUN 29* 19 17 15   CALCIUM 9.6 8.8* 9.0 9.2  CREATININE 1.63* 1.38* 1.34* 1.39*  GFRNONAA 32* 39* 41* 39*  GFRAA 37* 46* 47* 45*    LIVER FUNCTION TESTS: Recent Labs    07/13/18 0610 07/14/18 0653  BILITOT 0.5  --   AST 21  --   ALT 20  --   ALKPHOS 61  --   PROT 6.0*  --   ALBUMIN 3.6 3.3*    TUMOR MARKERS: No results for input(s): AFPTM, CEA, CA199, CHROMGRNA in the last 8760 hours.  Assessment and Plan:  Ms Scala is a 68 yo female with recent treatment for T12 and L1 compression fractures, performed 07/14/2018.    She has had ongoing pain after the procedure, and presents today for evaluation.  Plain film performed shows no unexpected findings, with no new fracture observed. The post-intervention changes at T12 and L1 are similar to the intra-op changes on 2/5.  I did let her know that it is my impression that she is experiencing some post-procedure pain, and that there is no reason to believe that there is a new problem.    She is hesitant to take a lot of opioid medication, but she is willing to try a course of 5/325 vicodin for a few days.  I am also giving her a medrol dose pak/steroid taper for any inflammation that may  be present.  She does not want to take OTC NSAIDS given some baseline renal insufficiency.    We also discussed conservative measures such as ice-pack, 13minutes on, 20 minutes off, and also avoiding heat at this point to avoid increasing blood flow/inflammation.   Plan: - Medical treatment with prn vicodin, med-rol dose pack.  - conservative measures such as ice-pack and rest - we will follow up with her in about 2-3 weeks to check her progress - Xray's appear negative today   Electronically Signed: Corrie Mckusick 07/20/2018, 3:32 PM   I spent a total of    15 Minutes in face to face in clinical consultation, greater than 50% of which was counseling/coordinating care for T12 & L1 KP, with back pain

## 2018-07-21 ENCOUNTER — Encounter: Payer: Self-pay | Admitting: Gynecology

## 2018-08-03 ENCOUNTER — Telehealth: Payer: Self-pay | Admitting: Radiology

## 2018-08-03 NOTE — Telephone Encounter (Signed)
3 week follow up call S/P Kyphoplasty of T12 & L1.    Patient reports that sx are slowly improving.  She will call back if needed.    Shivaun Bilello Riki Rusk, RN 08/03/2018 12:18 PM

## 2018-08-10 ENCOUNTER — Encounter: Payer: Self-pay | Admitting: Psychiatry

## 2018-08-10 ENCOUNTER — Ambulatory Visit: Payer: Medicare Other | Admitting: Psychiatry

## 2018-08-10 DIAGNOSIS — F5105 Insomnia due to other mental disorder: Secondary | ICD-10-CM

## 2018-08-10 DIAGNOSIS — G43009 Migraine without aura, not intractable, without status migrainosus: Secondary | ICD-10-CM | POA: Diagnosis not present

## 2018-08-10 DIAGNOSIS — F3110 Bipolar disorder, current episode manic without psychotic features, unspecified: Secondary | ICD-10-CM | POA: Diagnosis not present

## 2018-08-10 DIAGNOSIS — F411 Generalized anxiety disorder: Secondary | ICD-10-CM

## 2018-08-10 MED ORDER — CLONAZEPAM 0.5 MG PO TABS: 0.5000 mg | ORAL_TABLET | Freq: Three times a day (TID) | ORAL | 0 refills | Status: DC | PRN

## 2018-08-10 NOTE — Patient Instructions (Signed)
Equetro 100 mg at night for 5 nights , Then 200 mg for 5 nights then Increase to 300 mg nightly.

## 2018-08-10 NOTE — Progress Notes (Signed)
Kelli Morales [id number] 07/22/1950 68 y.o.  Subjective:   Patient ID:  Kelli Morales is a 68 y.o. (DOB 08-12-1950) female.  Chief Complaint:  Chief Complaint  Patient presents with  . Follow-up    Medication Management  . Memory Loss  . Anxiety    HPI  Kelli Morales presents to the office today for follow-up of bipolar disorder an insomnia chronically.   last seen April 15, 2018.  At that visit she was having depression anxiety and irritability.  We discussed options and she started Vraylar 1.5 mg a day. She called on December 12 complaining of a 7 or 8 pound weight gain and wanting to stop the medication.  She was content encouraged to continue until the appointment but was given permission to stop with the risk of worsening mood symptoms.  It is been very difficult to find a mood stabilizer that she tolerates and finds acceptable given that she will not take any that have a risk of weight gain.  Those decisions could not be made over the phone as to alternatives and she was encouraged to get an appointment as soon as possible. She stopped the Vraylar  In December.  Problems with concentration and memory and get lost driving at times.  Broke out crying.  Forget to do things.  Moving  And M had surgery.  Chronic pain after fall in Feb.  Increased spending.  Sleep has changed 4-5 hours nightly and usually 9 hours.  Leaving TV on at night.  Racing thoughts interferes with focus.  Not irritable.  Felt hyper and driven too much.  Lost 2 jobs bc of that.   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  Cycles from hyper to depressed under stress it's worse.  Fighting with [employer] over settlement.  Get hyper if late to somewhere.  Stressed out.  Can't move forward.  Sleep good with trazadone 300 and clonazepam.  Wants more clonazepam bc needs a dose in the day.  Disc poss of mood stabilizer but doesn't want one with wt gain risks.  No eating in 2 days and doesn't know  why.  Pt reports that mood is Anxious, Depressed and stressed. and describes anxiety as Moderate. Anxiety symptoms include: Excessive Worry,. Pt reports no sleep issues. Pt reports that appetite is good. Pt reports that energy is good and good. Concentration is down slightly. Suicidal thoughts:  denied by patient.  Past Psychiatric Medication Trials: Vraylar early weight gain.  Perphenazine 8 mg, quetiapine 300 mg, Abilify 10, lithium, Geodon, Depakote, CBZ remotely but this is uncertain,  Risperidone, latuda 5# weight gain.   Mirtazapine, paroxetine, Lexapro, sertraline, duloxetine 120,    Review of Systems:  Review of Systems  Musculoskeletal: Positive for arthralgias.  Neurological: Positive for headaches. Negative for tremors and weakness.  Psychiatric/Behavioral: Negative for agitation, behavioral problems, confusion, decreased concentration, dysphoric mood, hallucinations, self-injury, sleep disturbance and suicidal ideas. The patient is not nervous/anxious and is not hyperactive.   several episodes passing out from poor hydration and eating.  Medications: I have reviewed the patient's current medications.  Current Outpatient Medications  Medication Sig Dispense Refill  . Ascorbic Acid (VITAMIN C PO) Take 1 tablet by mouth daily.    Marland Kitchen CALCIUM PO Take 1 tablet by mouth daily.    . cholecalciferol (VITAMIN D) 1000 UNITS tablet Take 1,000 Units by mouth daily.    . clonazePAM (KLONOPIN) 0.5 MG tablet Take 1 tablet (0.5 mg  total) by mouth 3 (three) times daily as needed for anxiety (and insomnia). 270 tablet 0  . denosumab (PROLIA) 60 MG/ML SOLN injection Inject 60 mg into the skin every 6 (six) months. Administer in upper arm, thigh, or abdomen    . fish oil-omega-3 fatty acids 1000 MG capsule Take 1 g by mouth daily.    . fluticasone (FLONASE) 50 MCG/ACT nasal spray Place 2 sprays into the nose daily.    . Fluticasone Furoate (ARNUITY ELLIPTA IN) Inhale 1 puff into the lungs at  bedtime.     . Glycerin-Hypromellose-PEG 400 ([facility] DRY EYE RELIEF OP) Apply 1 drop to eye at bedtime as needed (dry eyes).    Marland Kitchen guaiFENesin (MUCINEX PO) Take 1 tablet by mouth at bedtime.    . IRON PO Take 1 tablet by mouth daily.    Marland Kitchen lamoTRIgine (LAMICTAL) 100 MG tablet TAKE 1 TABLET BY MOUTH IN  THE MORNING AND 2 TABLETS  AT BEDTIME. 270 tablet 0  . Multiple Vitamin (MULTIVITAMIN WITH MINERALS) TABS Take 1 tablet by mouth daily.    Marland Kitchen olmesartan (BENICAR) 40 MG tablet Take 20 mg by mouth daily.     . pantoprazole (PROTONIX) 40 MG tablet Take 40 mg by mouth daily.    . phenylephrine (SUDAFED PE) 10 MG TABS tablet Take 10 mg by mouth at bedtime.    . promethazine (PHENERGAN) 25 MG tablet Take 25 mg by mouth every 6 (six) hours as needed. Nausea    . tiZANidine (ZANAFLEX) 2 MG tablet Take 2 mg by mouth as needed (migraines).     . topiramate (TOPAMAX) 200 MG tablet Take 200 mg by mouth 2 (two) times daily.     . traZODone (DESYREL) 100 MG tablet TAKE 3 TABLETS BY MOUTH  EVERY NIGHT AT BEDTIME (Patient taking differently: Take 300 mg by mouth at bedtime. ) 270 tablet 0  . zonisamide (ZONEGRAN) 100 MG capsule Take 300 mg by mouth daily.     Marland Kitchen aspirin 81 MG tablet Take 325 mg by mouth 3 (three) times a week. Min, Wed, Fri    . docusate sodium (COLACE) 100 MG capsule Take 1 capsule (100 mg total) by mouth 2 (two) times daily. (Patient not taking: Reported on 08/10/2018) 10 capsule 0  . HYDROcodone-acetaminophen (NORCO/VICODIN) 5-325 MG tablet Take 1 tablet by mouth every 6 (six) hours as needed for severe pain. (Patient not taking: Reported on 08/10/2018) 15 tablet 0   No current facility-administered medications for this visit.     Medication Side Effects: None  Allergies:  Allergies  Allergen Reactions  . Dilaudid [Hydromorphone Hcl] Other (See Comments)    Nightmares  . Divalproex Sodium     Became comatose when she took Depakote and Geodon together.  Lindajo Royal [Ziprasidone Hydrochloride]      Became comatose when she took Depakote and Geodon together.  . Nsaids     "kidney problem"  . Perphenazine-Amitriptyline Itching  . Seroquel [Quetiapine Fumerate]     Past Medical History:  Diagnosis Date  . Anxiety   . Asthma   . Bipolar disorder ([place])   . Cataracts, bilateral   . Depression   . GERD (gastroesophageal reflux disease)   . Hydatid cyst    Left  . Hypertension   . Kidney problem    RT kidney is smaller -> decreased output  . Migraine   . Osteoporosis 11/2016   T score -2.8  improved from prior DEXA  . Right rotator cuff tear  Family History  Problem Relation Age of Onset  . Diabetes Father   . Hypertension Father   . Heart disease Father   . Stroke Father   . Cancer Father        Lymphoma  . Hypertension Mother   . Heart disease Mother   . Stroke Mother   . Stroke Paternal Uncle     Social History   Socioeconomic History  . Marital status: Divorced    Spouse name: Not on file  . Number of children: 1  . Years of education: MSN  . Highest education level: Not on file  Occupational History  . Not on file  Social Needs  . Financial resource strain: Not on file  . Food insecurity:    Worry: Not on file    Inability: Not on file  . Transportation needs:    Medical: Not on file    Non-medical: Not on file  Tobacco Use  . Smoking status: Never Smoker  . Smokeless tobacco: Never Used  Substance and Sexual Activity  . Alcohol use: Yes    Alcohol/week: 0.0 standard drinks    Comment: Rare  . Drug use: No  . Sexual activity: Not Currently    Birth control/protection: Post-menopausal    Comment: 1st intercourse 25 yo-2 partners  Lifestyle  . Physical activity:    Days per week: Not on file    Minutes per session: Not on file  . Stress: Not on file  Relationships  . Social connections:    Talks on phone: Not on file    Gets together: Not on file    Attends religious service: Not on file    Active member of club or organization: Not  on file    Attends meetings of clubs or organizations: Not on file    Relationship status: Not on file  . Intimate partner violence:    Fear of current or ex partner: Not on file    Emotionally abused: Not on file    Physically abused: Not on file    Forced sexual activity: Not on file  Other Topics Concern  . Not on file  Social History Narrative   Lives with husband and daughter   Caffeine use: 2 cups per day   Right handed    Past Medical History, Surgical history, Social history, and Family history were reviewed and updated as appropriate.   Please see review of systems for further details on the patient's review from today.   Objective:   Physical Exam:  There were no vitals taken for this visit.  Physical Exam Constitutional:      General: She is not in acute distress.    Appearance: She is well-developed.  Musculoskeletal:        General: No deformity.  Neurological:     Mental Status: She is alert and oriented to person, place, and time.     Motor: No tremor.     Coordination: Coordination normal.     Gait: Gait normal.  Psychiatric:        Attention and Perception: She is attentive. She does not perceive auditory hallucinations.        Mood and Affect: Mood is anxious and elated. Mood is not depressed. Affect is not labile, blunt, angry or inappropriate.        Speech: Speech normal.        Behavior: Behavior normal.        Thought Content: Thought content normal. Thought content is  not paranoid. Thought content does not include homicidal or suicidal ideation. Thought content does not include homicidal or suicidal plan.        Cognition and Memory: She exhibits impaired recent memory.        Judgment: Judgment normal.     Comments: Insight intact. No auditory or visual hallucinations. No delusions.      Lab Review:     Component Value Date/Time   NA 141 07/15/2018 0559   K 4.0 07/15/2018 0559   CL 114 (H) 07/15/2018 0559   CO2 23 07/15/2018 0559    GLUCOSE 95 07/15/2018 0559   BUN 15 07/15/2018 0559   CREATININE 1.39 (H) 07/15/2018 0559   CALCIUM 9.2 07/15/2018 0559   PROT 6.0 (L) 07/13/2018 0610   ALBUMIN 3.3 (L) 07/14/2018 0653   AST 21 07/13/2018 0610   ALT 20 07/13/2018 0610   ALKPHOS 61 07/13/2018 0610   BILITOT 0.5 07/13/2018 0610   GFRNONAA 39 (L) 07/15/2018 0559   GFRAA 45 (L) 07/15/2018 0559       Component Value Date/Time   WBC 5.5 07/14/2018 0653   RBC 3.51 (L) 07/14/2018 0653   HGB 10.4 (L) 07/14/2018 0653   HCT 34.4 (L) 07/14/2018 0653   PLT 142 (L) 07/14/2018 0653   MCV 98.0 07/14/2018 0653   MCH 29.6 07/14/2018 0653   MCHC 30.2 07/14/2018 0653   RDW 11.9 07/14/2018 0653   LYMPHSABS 1.6 07/14/2018 0653   MONOABS 0.4 07/14/2018 0653   EOSABS 0.2 07/14/2018 0653   BASOSABS 0.0 07/14/2018 0653    No results found for: POCLITH, LITHIUM   No results found for: PHENYTOIN, PHENOBARB, VALPROATE, CBMZ   .res Assessment: Plan:    Bipolar I disorder, most recent episode (or current) manic ([place])  Generalized anxiety disorder - Plan: clonazePAM (KLONOPIN) 0.5 MG tablet  Insomnia due to mental condition  Migraine without aura and without status migrainosus, not intractable  Please see After Visit Summary for patient specific instructions.  Greater than 50% of face to face time with patient was spent on counseling and coordination of care. We discussed unstable bipolar with mixed symptoms and how she is not on traditionally potent mood stabilizer.  She is very sensitive to some mood stabilizers and is very sensitive about her weight and anything that might cause weight gain.  She also is med sensitive complicating treatment.  She is currently hypomanic to manic.  This is causing the memory problems as well as the insomnia etc.  No other options except  CBZ and oxcarbazapine with low weight gain risk unless haldol.  Disc SE each in detail including TD.  Discussed potential metabolic side effects associated with  atypical antipsychotics, as well as potential risk for movement side effects. Advised pt to contact office if movement side effects occur.  Equetro.  Disc risk SJS etc. With it.  Discussed drug interaction issues and we will need to increase the lamotrigine and if she tolerates this medication. Samples Equetro 100 increase from 1-3 each night in 5-day intervals  We discussed the short-term risks associated with benzodiazepines including sedation and increased fall risk among others.  Discussed long-term side effect risk including dependence, potential withdrawal symptoms, and the potential eventual dose-related risk of dementia.  Ok increase clonazepam, since not a high dosage.  Hope to use it extra just temporarily.  Send in prescription for clonazepam 0.5 mg 3 times daily as needed anxiety or insomnia.  Use the clonazepam to help improve sleep quality quickly  because the insomnia is feeling the mania.  Later we will cut back on the clonazepam  This appointment was 35 minutes  Follow-up 6 weeks  Lynder Parents MD, DFAPA   Future Appointments  Date Time Provider [place]  08/18/2018 11:30 AM Miquel Dunn, NP [facility] None    No orders of the defined types were placed in this encounter.     -------------------------------

## 2018-08-16 NOTE — Progress Notes (Signed)
Subjective:   @Patient  ID@: Kelli Morales, female    DOB: Aug 31, 1950, 68 y.o.   MRN: [id number]  Chief Complaint  Patient presents with  . Dizziness  . Follow-up    hospital f/u  . New Patient (Initial Visit)    last EKG 07/12/18     Patient referred by Harlan Stains for with history of anxiety, depression, bipolar disorder, GERD, hypertension, normocytic anemia, referred to [place] for evaluation of recurrent syncope and for hospital follow-up.    HPI: Patient was recently admitted to [facility] on 07/12/2018 after having 2 episodes of near syncope and 1 episode a few minutes later with complete syncope.  No loss of bowel or bladder.  She did injure her back during the episode and was found to have acute compression fracture of T12-L1 in which she underwent kyphoplasty on 2/5.  MRI of the brain was negative and she did not have any arrhythmias noted on telemetry during her hospital stay.  Had normal LVEF by echocardiogram.  She was not noted to be orthostatic.  Patient reports previous episodes of syncope over the last 5 years that are have been unexplained. Not associated with chest pain, dyspnea, or heart racing. She can generally tell when episode is about to occur as she feels completely wiped out. She has had one episode where she lost control of bowel and bladder. She has been referred to [place] for cardiac evaluation of her syncope.  During her hospital stay, did have acute kidney injury on stage III chronic kidney disease, that did improve with temporarily holding ARB and was restarted at discharge.  She has not had any reoccurrence of syncope since her episode in Feb. She does not exercise, but is fairly active with her job. She denies any chest pain or shortness of breath with activities.   Hypertension is well controlled generally. Denies any diabetes or hyperlipidemia. No former tobacco use.   Past Medical History:  Diagnosis Date  . Anxiety   . Asthma   . Bipolar  disorder ([place])   . Cataracts, bilateral   . Depression   . GERD (gastroesophageal reflux disease)   . Hydatid cyst    Left  . Hypertension   . Kidney problem    RT kidney is smaller -> decreased output  . Migraine   . Osteoporosis 11/2016   T score -2.8  improved from prior DEXA  . Right rotator cuff tear     Past Surgical History:  Procedure Laterality Date  . BUNIONECTOMY     L foot 07/2011 --RT foot 10/2011  . CATARACT EXTRACTION    . Fimbrioplasty-bilateral-Lysis of adhesions-Excision of Left ovarian cyst  1986  . Hydatid cyst     Left-Adhesions  . IR KYPHO EA ADDL LEVEL THORACIC OR LUMBAR  07/14/2018  . IR KYPHO LUMBAR INC FX REDUCE BONE BX UNI/BIL CANNULATION INC/IMAGING  07/14/2018  . IR RADIOLOGIST EVAL & MGMT  07/20/2018  . ROTATOR CUFF REPAIR     LEFT  . SHOULDER OPEN ROTATOR CUFF REPAIR Right 08/26/2012   Procedure: RIGHT SHOULDER MINI OPEN ROTATOR CUFF REPAIR POSSIBLE PATCH GRAFT AND SUBACROMIAL DECOMPRESSION;  Surgeon: Johnn Hai, MD;  Location: [facility];  Service: Orthopedics;  Laterality: Right;    Family History  Problem Relation Age of Onset  . Diabetes Father   . Hypertension Father   . Heart disease Father   . Stroke Father   . Cancer Father  Lymphoma  . Hypertension Mother   . Heart disease Mother   . Stroke Mother   . Stroke Paternal Uncle   . Heart disease Brother     Social History   Socioeconomic History  . Marital status: Divorced    Spouse name: Not on file  . Number of children: 1  . Years of education: MSN  . Highest education level: Not on file  Occupational History  . Not on file  Social Needs  . Financial resource strain: Not on file  . Food insecurity:    Worry: Not on file    Inability: Not on file  . Transportation needs:    Medical: Not on file    Non-medical: Not on file  Tobacco Use  . Smoking status: Never Smoker  . Smokeless tobacco: Never Used  Substance and Sexual Activity  . Alcohol use: Yes     Alcohol/week: 0.0 standard drinks    Comment: Rare  . Drug use: No  . Sexual activity: Not Currently    Birth control/protection: Post-menopausal    Comment: 1st intercourse 12 yo-2 partners  Lifestyle  . Physical activity:    Days per week: Not on file    Minutes per session: Not on file  . Stress: Not on file  Relationships  . Social connections:    Talks on phone: Not on file    Gets together: Not on file    Attends religious service: Not on file    Active member of club or organization: Not on file    Attends meetings of clubs or organizations: Not on file    Relationship status: Not on file  . Intimate partner violence:    Fear of current or ex partner: Not on file    Emotionally abused: Not on file    Physically abused: Not on file    Forced sexual activity: Not on file  Other Topics Concern  . Not on file  Social History Narrative   Lives with husband and daughter   Caffeine use: 2 cups per day   Right handed    Current Outpatient Medications on File Prior to Visit  Medication Sig Dispense Refill  . ACETAMINOPHEN 8 HOUR PO Take 650 mg by mouth as needed.    . Ascorbic Acid (VITAMIN C PO) Take 1 tablet by mouth daily.    Marland Kitchen aspirin 81 MG tablet Take 325 mg by mouth 3 (three) times a week. Min, Wed, Fri    . CALCIUM PO Take 1 tablet by mouth daily.    . Carbamazepine (EQUETRO) 100 MG CP12 12 hr capsule Take 100 mg by mouth at bedtime.    . cholecalciferol (VITAMIN D) 1000 UNITS tablet Take 1,000 Units by mouth 2 (two) times daily.     . clonazePAM (KLONOPIN) 0.5 MG tablet Take 1 tablet (0.5 mg total) by mouth 3 (three) times daily as needed for anxiety (and insomnia). 270 tablet 0  . denosumab (PROLIA) 60 MG/ML SOLN injection Inject 60 mg into the skin every 6 (six) months. Administer in upper arm, thigh, or abdomen    . fish oil-omega-3 fatty acids 1000 MG capsule Take 1 g by mouth daily.    . fluticasone (FLONASE) 50 MCG/ACT nasal spray Place 2 sprays into the nose  daily.    . Fluticasone Furoate (ARNUITY ELLIPTA IN) Inhale 1 puff into the lungs at bedtime.     . Glycerin-Hypromellose-PEG 400 ([employer] DRY EYE RELIEF OP) Apply 1 drop to eye at bedtime  as needed (dry eyes).    Marland Kitchen guaiFENesin (MUCINEX PO) Take 1 tablet by mouth as needed.     . IRON PO Take 1 tablet by mouth daily.    Marland Kitchen lamoTRIgine (LAMICTAL) 100 MG tablet TAKE 1 TABLET BY MOUTH IN  THE MORNING AND 2 TABLETS  AT BEDTIME. 270 tablet 0  . magnesium gluconate (MAGONATE) 500 MG tablet Take 500 mg by mouth daily.    . Multiple Vitamin (MULTIVITAMIN WITH MINERALS) TABS Take 1 tablet by mouth daily.    . Multiple Vitamins-Minerals (COMPLETE MULTIVITAMIN/MINERAL PO) Take 1 tablet by mouth daily.    Marland Kitchen olmesartan (BENICAR) 40 MG tablet Take 20 mg by mouth daily.     . pantoprazole (PROTONIX) 40 MG tablet Take 40 mg by mouth daily.    . polyethylene glycol (MIRALAX / GLYCOLAX) packet Take 17 g by mouth 3 (three) times a week.    Marland Kitchen tiZANidine (ZANAFLEX) 2 MG tablet Take 2 mg by mouth as needed (migraines).     . topiramate (TOPAMAX) 200 MG tablet Take 200 mg by mouth 2 (two) times daily.     . traMADol (ULTRAM) 50 MG tablet Take 1-2 tablets by mouth as needed.    . traZODone (DESYREL) 100 MG tablet TAKE 3 TABLETS BY MOUTH  EVERY NIGHT AT BEDTIME (Patient taking differently: Take 300 mg by mouth at bedtime. ) 270 tablet 0  . zonisamide (ZONEGRAN) 100 MG capsule Take 300 mg by mouth daily.     Marland Kitchen docusate sodium (COLACE) 100 MG capsule Take 1 capsule (100 mg total) by mouth 2 (two) times daily. (Patient not taking: Reported on 08/10/2018) 10 capsule 0  . HYDROcodone-acetaminophen (NORCO/VICODIN) 5-325 MG tablet Take 1 tablet by mouth every 6 (six) hours as needed for severe pain. (Patient not taking: Reported on 08/10/2018) 15 tablet 0  . phenylephrine (SUDAFED PE) 10 MG TABS tablet Take 10 mg by mouth at bedtime.    . promethazine (PHENERGAN) 25 MG tablet Take 25 mg by mouth every 6 (six) hours as needed. Nausea       No current facility-administered medications on file prior to visit.      Review of Systems  Constitution: Negative for decreased appetite, malaise/fatigue, weight gain and weight loss.  Eyes: Negative for visual disturbance.  Cardiovascular: Positive for syncope (recurrent). Negative for chest pain, claudication, dyspnea on exertion, leg swelling, orthopnea and palpitations.  Respiratory: Negative for hemoptysis and wheezing.   Endocrine: Negative for cold intolerance and heat intolerance.  Hematologic/Lymphatic: Negative for bleeding problem. Does not bruise/bleed easily.  Skin: Negative for nail changes.  Musculoskeletal: Positive for back pain (since injury). Negative for muscle weakness and myalgias.  Gastrointestinal: Negative for abdominal pain, change in bowel habit, nausea and vomiting.  Neurological: Negative for difficulty with concentration, dizziness, focal weakness and headaches.  Psychiatric/Behavioral: Negative for altered mental status and suicidal ideas.  All other systems reviewed and are negative.  Height 5\' 5"  (1.651 m), weight 149 lb 3.2 oz (67.7 kg), SpO2 98 %.  BP supine 164/95, BP sitting 149/98,  BP standing 157/92. Pulse 61     Objective:   Physical Exam  Constitutional: She is oriented to person, place, and time. She appears well-developed and well-nourished. No distress.  HENT:  Head: Atraumatic.  Eyes: Conjunctivae are normal.  Neck: Neck supple. No JVD present. No thyromegaly present.  Cardiovascular: Normal rate, regular rhythm, normal heart sounds and intact distal pulses. Exam reveals no gallop.  No murmur heard. Pulmonary/Chest: Effort  normal and breath sounds normal. No respiratory distress.  Abdominal: Soft. Bowel sounds are normal. She exhibits no distension. There is no abdominal tenderness.  Musculoskeletal: Normal range of motion.        General: No edema.  Neurological: She is alert and oriented to person, place, and time.  Skin: Skin  is warm and dry.  Psychiatric: She has a normal mood and affect.  Vitals reviewed.     Cardiac studies:   Outside Echocardiogram 07/14/2018:  1. The left ventricle has normal systolic function of 76-73%. The cavity size was normal. There is mildly increased left ventricular wall thickness. Echo evidence of impaired diastolic relaxation. 2. The right ventricle has normal systolic function. The cavity was normal. There is no increase in right ventricular wall thickness. 3. Trivial pericardial effusion. 4. The mitral valve is normal in structure. No evidence of mitral valve stenosis. Trivial mitral regurgitation. 5. The tricuspid valve is normal in structure. 6. The aortic valve is tricuspid. 7. The aortic root and ascending aorta are normal in size and structure. 8. No evidence of left ventricular regional wall motion abnormalities. 9. No complete TR doppler jet so unable to estimate PA systolic pressure.  EKG 07/12/18:  Sinus rhythm Prolonged PR interval Incomplete right bundle branch block Baseline wander Abnormal ekg  Renal Artery Duplex 08/21/2011: Normal caliber aorta. Asymmetrical kidney size, right smaller by 3.0 cm in length. Normal renal arteries, bilaterally.       Assessment & Recommendations:  Vasovagal syncope - Plan: Cardiac event monitor, PCV CARDIAC STRESS TEST  Essential hypertension  CKD (chronic kidney disease) stage 3, GFR 30-59 ml/min (HCC)  Family history of heart disease    Plan: Clinically patient symptoms are consistent with vasovagal syncope which was discussed in detail with the patient.  She does have risk factors for CAD and will further evaluate with routine treadmill stress testing as well as to see if she has any exercise-induced arrhythmias.  I will also place her on 30-day event monitor and if she does not have any arrhythmias on this, could consider implantation of loop recorder for further evaluation if suspicion for arrhythmia is high.  She  was given patient education on counterpressure maneuvers.  Encouraged her to stay well-hydrated and use support stockings.    Her blood pressure is elevated today, but she does monitor frequently at home.  Not noted to be orthostatic. No changes were made to her medications today, if she continues to have elevated blood pressure at home she will contact me and could consider starting amlodipine 5 mg daily.  We will also reevaluate at her next office visit.  I do not have a recent lipids from PCP office, but patient states his stable.  Will request for our records for further stratification.  Physical examination and EKG performed in hospital are normal.  I will see her back after the test for further recommendations and reevaluation.  Thank you for referring this patient. Please do not hesitate to contact me for any questions.    *I have discussed this case with Dr. Virgina Jock and he personally examined the patient and participated in formulating the plan.Jeri Lager, MSN, APRN, FNP-C [facility] [place] Office: [phone] Fax: [phone]

## 2018-08-18 ENCOUNTER — Encounter: Payer: Self-pay | Admitting: Cardiology

## 2018-08-18 ENCOUNTER — Ambulatory Visit: Payer: Medicare Other | Admitting: Cardiology

## 2018-08-18 ENCOUNTER — Other Ambulatory Visit: Payer: Self-pay

## 2018-08-18 ENCOUNTER — Ambulatory Visit: Payer: Medicare Other

## 2018-08-18 VITALS — Ht 65.0 in | Wt 149.2 lb

## 2018-08-18 DIAGNOSIS — I129 Hypertensive chronic kidney disease with stage 1 through stage 4 chronic kidney disease, or unspecified chronic kidney disease: Secondary | ICD-10-CM

## 2018-08-18 DIAGNOSIS — N183 Chronic kidney disease, stage 3 unspecified: Secondary | ICD-10-CM | POA: Insufficient documentation

## 2018-08-18 DIAGNOSIS — Z8249 Family history of ischemic heart disease and other diseases of the circulatory system: Secondary | ICD-10-CM | POA: Diagnosis not present

## 2018-08-18 DIAGNOSIS — R55 Syncope and collapse: Secondary | ICD-10-CM | POA: Diagnosis not present

## 2018-08-18 DIAGNOSIS — I10 Essential (primary) hypertension: Secondary | ICD-10-CM

## 2018-08-19 DIAGNOSIS — R55 Syncope and collapse: Secondary | ICD-10-CM | POA: Diagnosis not present

## 2018-08-20 ENCOUNTER — Telehealth: Payer: Self-pay

## 2018-08-20 DIAGNOSIS — R55 Syncope and collapse: Secondary | ICD-10-CM

## 2018-08-20 DIAGNOSIS — I10 Essential (primary) hypertension: Secondary | ICD-10-CM

## 2018-08-20 DIAGNOSIS — N183 Chronic kidney disease, stage 3 unspecified: Secondary | ICD-10-CM

## 2018-08-20 NOTE — Telephone Encounter (Signed)
Pt called concerned about the stress test. Stated her back causes her too much pain to be able to have the stress test done. Wants to cancel.  Please Advise.

## 2018-08-20 NOTE — Telephone Encounter (Signed)
Patient called stating that she is unable to walk the treadmill due to severe back pain.  Will change it to [facility] stress test.

## 2018-08-20 NOTE — Telephone Encounter (Signed)
I have placed Lexiscan myoview stress orders, so she does not have to exercise

## 2018-08-21 ENCOUNTER — Other Ambulatory Visit: Payer: Self-pay | Admitting: Psychiatry

## 2018-08-23 ENCOUNTER — Telehealth: Payer: Self-pay

## 2018-08-23 NOTE — Telephone Encounter (Signed)
Kelli Morales from [facility] called to advise of critical..Marland KitchenMarland KitchenManual push of Syncope. Result-Sinus Rhythm . They attempted to call pt but no answer. I called her and she answered stating that it was an accidental push. She is fine.//ah

## 2018-08-23 NOTE — Telephone Encounter (Signed)
Okay thank you for checking

## 2018-08-24 ENCOUNTER — Other Ambulatory Visit: Payer: Self-pay | Admitting: Interventional Radiology

## 2018-08-24 ENCOUNTER — Ambulatory Visit: Payer: Medicare Other | Admitting: Psychiatry

## 2018-08-24 ENCOUNTER — Other Ambulatory Visit: Payer: Medicare Other

## 2018-08-24 DIAGNOSIS — S22080G Wedge compression fracture of T11-T12 vertebra, subsequent encounter for fracture with delayed healing: Secondary | ICD-10-CM

## 2018-08-24 DIAGNOSIS — S32010G Wedge compression fracture of first lumbar vertebra, subsequent encounter for fracture with delayed healing: Secondary | ICD-10-CM

## 2018-08-24 NOTE — Telephone Encounter (Signed)
Pt scheduled MRI and does not want to do the test. Wants to know if she can reschedule or if she could cancel.

## 2018-08-25 ENCOUNTER — Ambulatory Visit (HOSPITAL_COMMUNITY)
Admission: RE | Admit: 2018-08-25 | Discharge: 2018-08-25 | Disposition: A | Discharge status: Home / Self Care | Payer: Medicare Other | Source: Ambulatory Visit | Attending: Interventional Radiology | Admitting: Interventional Radiology

## 2018-08-25 ENCOUNTER — Telehealth: Payer: Self-pay | Admitting: Cardiology

## 2018-08-25 ENCOUNTER — Other Ambulatory Visit: Payer: Self-pay

## 2018-08-25 DIAGNOSIS — S32010G Wedge compression fracture of first lumbar vertebra, subsequent encounter for fracture with delayed healing: Secondary | ICD-10-CM

## 2018-08-25 DIAGNOSIS — S22080G Wedge compression fracture of T11-T12 vertebra, subsequent encounter for fracture with delayed healing: Secondary | ICD-10-CM | POA: Diagnosis present

## 2018-08-25 NOTE — Telephone Encounter (Signed)
She can reschedule in a couple weeks.

## 2018-08-25 NOTE — Telephone Encounter (Signed)
She called that she is not able to do the stress test and I scheduled Lexiscan stress as she has back pain. She is scheduled for MRI back, she could reschedule to Lexi (so she does not have to walk the treadmill) after the MRI.  JG

## 2018-08-26 ENCOUNTER — Other Ambulatory Visit: Payer: Self-pay | Admitting: Interventional Radiology

## 2018-08-27 ENCOUNTER — Telehealth: Payer: Self-pay | Admitting: Radiology

## 2018-08-27 NOTE — Telephone Encounter (Signed)
Patient called Tues, March 17 with complaint of continued pain 5 wks post Kyphoplasty. She states that "pain makes it difficult to stand straight".  She is requesting refill of Hydrocodone.  Dr Laurence Ferrari notified; MR Thoracic & Lumbar performed 08/25/2018 (see MR report in Epic).  Dr. Laurence Ferrari feels that the patient's description of pain is out of proportion to MR exam.  He has advised that she go to ED for further evaluation.    Patient contacted late afternoon yesterday and again today. She admits that she has been lifting some heavy objects and has not been taking Tramadol.  Patient encouraged to avoid heavy lifting and to begin taking Tramadol.  If pain persists, she should call her primary physician or go to ED for further evaluation.  Patient states that she understands and agrees.  Kutler Vanvranken Riki Rusk, RN 08/27/2018 9:54 AM

## 2018-08-29 ENCOUNTER — Other Ambulatory Visit: Payer: Self-pay | Admitting: Psychiatry

## 2018-08-29 DIAGNOSIS — F411 Generalized anxiety disorder: Secondary | ICD-10-CM

## 2018-08-31 ENCOUNTER — Ambulatory Visit: Payer: Medicare Other | Admitting: Psychiatry

## 2018-09-17 DIAGNOSIS — R55 Syncope and collapse: Secondary | ICD-10-CM | POA: Diagnosis not present

## 2018-09-22 ENCOUNTER — Other Ambulatory Visit: Payer: Self-pay

## 2018-09-22 ENCOUNTER — Encounter: Payer: Self-pay | Admitting: Cardiology

## 2018-09-22 ENCOUNTER — Ambulatory Visit (INDEPENDENT_AMBULATORY_CARE_PROVIDER_SITE_OTHER): Payer: Medicare Other | Admitting: Cardiology

## 2018-09-22 VITALS — BP 132/74 | Ht 65.0 in | Wt 141.0 lb

## 2018-09-22 DIAGNOSIS — I10 Essential (primary) hypertension: Secondary | ICD-10-CM

## 2018-09-22 DIAGNOSIS — R55 Syncope and collapse: Secondary | ICD-10-CM

## 2018-09-22 DIAGNOSIS — Z8249 Family history of ischemic heart disease and other diseases of the circulatory system: Secondary | ICD-10-CM

## 2018-09-22 NOTE — Progress Notes (Signed)
Subjective:   @Patient  ID@: Kelli Morales, female    DOB: [birth year], 68 y.o.   MRN: [id number]  Chief Complaint  Patient presents with  . Hypertension  . Follow-up   This visit type was conducted due to national recommendations for restrictions regarding the COVID-19 Pandemic (e.g. social distancing).  This format is felt to be most appropriate for this patient at this time.  All issues noted in this document were discussed and addressed.  No physical exam was performed (except for noted visual exam findings with Telehealth visits).  The patient has consented to conduct a Telehealth visit and understands insurance will be billed.   I discussed the limitations of evaluation and management by telemedicine and the availability of in person appointments. The patient expressed understanding and agreed to proceed.  Virtual Visit via Video Note is as below  I connected with Kelli Morales, on 09/22/18 at 1450 by telephone and verified that I am speaking with the correct person using two identifiers. Several attempts were made to perform video conferencing; however, patient had technological problems.     I have discussed with her regarding the safety during COVID Pandemic and steps and precautions including social distancing with the patient.    HPI:   Ms. Kelli Morales is a pleasant 68 year old female with anxiety, depression, bipolar disorder, GERD, hypertension, normocytic anemia, recently evaluated by [place] for syncope.    Patient had had 2 episodes of near syncope and 1 episode a few minutes later with complete syncope on 07/12/2018. She had previously had an episode of syncope 5 years ago that was unexplained. Her recent episode did result in acute compression fracture of T12-L1 in which she underwent kyphoplasty on 2/5.  MRI of the brain was negative and she did not have any arrhythmias noted on telemetry during her hospital stay. She had normal echocardiogram during her hospital stay. Her  symptoms felt to be possibly related to vasovagal syncope; however, she was placed on 30 day event monitor that was without any arrhythmias. She is here to discuss results.  Stress testing was ordered; however, was cancelled in view of COVID 19 restrictions and has not been rescheduled yet.  She was not noted to be orthostatic. She denies any symptoms of chest pain, shortness of breath, or palpitations. She has not had any reoccurrence of syncope since her episode in february.   Blood pressure has been controlled at home. She has recently moved to a new house and states that removing herself from stressful situation has helped her overall well being. She does continue to have significant back pain and has actually re-fractured her back due to heavy lifting and is now out of work.    Denies any diabetes or hyperlipidemia. No former tobacco use.   Past Medical History:  Diagnosis Date  . Anxiety   . Asthma   . Bipolar disorder ([place])   . Cataracts, bilateral   . Depression   . GERD (gastroesophageal reflux disease)   . Hydatid cyst    Left  . Hypertension   . Kidney problem    RT kidney is smaller -> decreased output  . Migraine   . Osteoporosis 11/2016   T score -2.8  improved from prior DEXA  . Right rotator cuff tear     Past Surgical History:  Procedure Laterality Date  . BUNIONECTOMY     L foot 07/2011 --RT foot 10/2011  . CATARACT EXTRACTION    . Fimbrioplasty-bilateral-Lysis  of adhesions-Excision of Left ovarian cyst  1986  . Hydatid cyst     Left-Adhesions  . IR KYPHO EA ADDL LEVEL THORACIC OR LUMBAR  07/14/2018  . IR KYPHO LUMBAR INC FX REDUCE BONE BX UNI/BIL CANNULATION INC/IMAGING  07/14/2018  . IR RADIOLOGIST EVAL & MGMT  07/20/2018  . ROTATOR CUFF REPAIR     LEFT  . SHOULDER OPEN ROTATOR CUFF REPAIR Right 08/26/2012   Procedure: RIGHT SHOULDER MINI OPEN ROTATOR CUFF REPAIR POSSIBLE PATCH GRAFT AND SUBACROMIAL DECOMPRESSION;  Surgeon: Johnn Hai, MD;  Location: [facility];   Service: Orthopedics;  Laterality: Right;    Family History  Problem Relation Age of Onset  . Diabetes Father   . Hypertension Father   . Heart disease Father   . Stroke Father   . Cancer Father        Lymphoma  . Hypertension Mother   . Heart disease Mother   . Stroke Mother   . Stroke Paternal Uncle   . Heart disease Brother     Social History   Socioeconomic History  . Marital status: Divorced    Spouse name: Not on file  . Number of children: 1  . Years of education: MSN  . Highest education level: Not on file  Occupational History  . Not on file  Social Needs  . Financial resource strain: Not on file  . Food insecurity:    Worry: Not on file    Inability: Not on file  . Transportation needs:    Medical: Not on file    Non-medical: Not on file  Tobacco Use  . Smoking status: Never Smoker  . Smokeless tobacco: Never Used  Substance and Sexual Activity  . Alcohol use: Yes    Alcohol/week: 0.0 standard drinks    Comment: Rare  . Drug use: No  . Sexual activity: Not Currently    Birth control/protection: Post-menopausal    Comment: 1st intercourse 20 yo-2 partners  Lifestyle  . Physical activity:    Days per week: Not on file    Minutes per session: Not on file  . Stress: Not on file  Relationships  . Social connections:    Talks on phone: Not on file    Gets together: Not on file    Attends religious service: Not on file    Active member of club or organization: Not on file    Attends meetings of clubs or organizations: Not on file    Relationship status: Not on file  . Intimate partner violence:    Fear of current or ex partner: Not on file    Emotionally abused: Not on file    Physically abused: Not on file    Forced sexual activity: Not on file  Other Topics Concern  . Not on file  Social History Narrative   Lives with husband and daughter   Caffeine use: 2 cups per day   Right handed    Current Outpatient Medications on File Prior to Visit   Medication Sig Dispense Refill  . ACETAMINOPHEN 8 HOUR PO Take 650 mg by mouth as needed.    . Ascorbic Acid (VITAMIN C PO) Take 1 tablet by mouth daily.    Marland Kitchen aspirin 81 MG tablet Take 325 mg by mouth 3 (three) times a week. Min, Wed, Fri    . CALCIUM PO Take 1 tablet by mouth daily.    . Carbamazepine (EQUETRO) 100 MG CP12 12 hr capsule Take 100 mg by mouth  at bedtime.    . cholecalciferol (VITAMIN D) 1000 UNITS tablet Take 1,000 Units by mouth 2 (two) times daily.     . clonazePAM (KLONOPIN) 0.5 MG tablet Take 1 tablet (0.5 mg total) by mouth 3 (three) times daily as needed for anxiety (and insomnia). 270 tablet 0  . denosumab (PROLIA) 60 MG/ML SOLN injection Inject 60 mg into the skin every 6 (six) months. Administer in upper arm, thigh, or abdomen    . fluticasone (FLONASE) 50 MCG/ACT nasal spray Place 2 sprays into the nose daily.    . Fluticasone Furoate (ARNUITY ELLIPTA IN) Inhale 1 puff into the lungs at bedtime.     . Glycerin-Hypromellose-PEG 400 ([facility] DRY EYE RELIEF OP) Apply 1 drop to eye at bedtime as needed (dry eyes).    Marland Kitchen guaiFENesin (MUCINEX PO) Take 1 tablet by mouth as needed.     . IRON PO Take 1 tablet by mouth daily.    Marland Kitchen lamoTRIgine (LAMICTAL) 100 MG tablet TAKE 1 TABLET BY MOUTH IN  THE MORNING AND 2 TABLETS  AT BEDTIME. 270 tablet 0  . magnesium gluconate (MAGONATE) 500 MG tablet Take 500 mg by mouth daily.    . Multiple Vitamin (MULTIVITAMIN WITH MINERALS) TABS Take 1 tablet by mouth daily.    . Multiple Vitamins-Minerals (COMPLETE MULTIVITAMIN/MINERAL PO) Take 1 tablet by mouth daily.    Marland Kitchen olmesartan (BENICAR) 40 MG tablet Take 20 mg by mouth daily.     . pantoprazole (PROTONIX) 40 MG tablet Take 40 mg by mouth daily.    . phenylephrine (SUDAFED PE) 10 MG TABS tablet Take 10 mg by mouth every 4 (four) hours as needed.     . polyethylene glycol (MIRALAX / GLYCOLAX) packet Take 17 g by mouth 3 (three) times a week.    . promethazine (PHENERGAN) 25 MG tablet Take 25 mg  by mouth every 6 (six) hours as needed. Nausea    . tiZANidine (ZANAFLEX) 2 MG tablet Take 2 mg by mouth as needed (migraines).     . topiramate (TOPAMAX) 200 MG tablet Take 200 mg by mouth 2 (two) times daily.     . traMADol (ULTRAM) 50 MG tablet Take 1-2 tablets by mouth as needed.    . traZODone (DESYREL) 100 MG tablet TAKE 3 TABLETS BY MOUTH  EVERY NIGHT AT BEDTIME 270 tablet 0  . zonisamide (ZONEGRAN) 100 MG capsule Take 300 mg by mouth daily.     Marland Kitchen docusate sodium (COLACE) 100 MG capsule Take 1 capsule (100 mg total) by mouth 2 (two) times daily. (Patient not taking: Reported on 09/22/2018) 10 capsule 0  . fish oil-omega-3 fatty acids 1000 MG capsule Take 1 g by mouth daily.    Marland Kitchen HYDROcodone-acetaminophen (NORCO/VICODIN) 5-325 MG tablet Take 1 tablet by mouth every 6 (six) hours as needed for severe pain. (Patient not taking: Reported on 09/22/2018) 15 tablet 0   No current facility-administered medications on file prior to visit.      Review of Systems  Constitution: Negative for decreased appetite, malaise/fatigue, weight gain and weight loss.  Eyes: Negative for visual disturbance.  Cardiovascular: Positive for syncope (recurrent). Negative for chest pain, claudication, dyspnea on exertion, leg swelling, orthopnea and palpitations.  Respiratory: Negative for hemoptysis and wheezing.   Endocrine: Negative for cold intolerance and heat intolerance.  Hematologic/Lymphatic: Negative for bleeding problem. Does not bruise/bleed easily.  Skin: Negative for nail changes.  Musculoskeletal: Positive for back pain (since injury). Negative for muscle weakness and myalgias.  Gastrointestinal: Negative for abdominal pain, change in bowel habit, nausea and vomiting.  Neurological: Negative for difficulty with concentration, dizziness, focal weakness and headaches.  Psychiatric/Behavioral: Negative for altered mental status and suicidal ideas.  All other systems reviewed and are negative.  Blood  pressure 132/74, height 5\' 5"  (1.651 m), weight 141 lb (64 kg).       Objective:    *Physical exam not performed as this is a telephone visit*    Cardiac studies:   30 day event monitor 03/12-04/03/2019: NSR. Rare PAC. No reported symptoms. No high degree AV block was noted.   Outside Echocardiogram 07/14/2018:  1. The left ventricle has normal systolic function of 50-03%. The cavity size was normal. There is mildly increased left ventricular wall thickness. Echo evidence of impaired diastolic relaxation. 2. The right ventricle has normal systolic function. The cavity was normal. There is no increase in right ventricular wall thickness. 3. Trivial pericardial effusion. 4. The mitral valve is normal in structure. No evidence of mitral valve stenosis. Trivial mitral regurgitation. 5. The tricuspid valve is normal in structure. 6. The aortic valve is tricuspid. 7. The aortic root and ascending aorta are normal in size and structure. 8. No evidence of left ventricular regional wall motion abnormalities. 9. No complete TR doppler jet so unable to estimate PA systolic pressure.  EKG 07/12/18:  Sinus rhythm Prolonged PR interval Incomplete right bundle branch block Baseline wander Abnormal ekg  Renal Artery Duplex 08/21/2011: Normal caliber aorta. Asymmetrical kidney size, right smaller by 3.0 cm in length. Normal renal arteries, bilaterally.       Assessment & Recommendations:  Vasovagal syncope  Essential hypertension  Family history of heart disease    Plan: I have discussed recent event monitor results with the patient, no arrhythmias or symptoms were noted. She has not had any reoccurrence of syncope. Continue to suspect vasovagal etiology. Will continue with watchful waiting. Encouraged her to stay well hydrated to help with prevention. She had normal echocardiogram. I continue to feel that she needs stress testing for risk stratification also in view of her risk factors.  She will be scheduled for lexiscan nuclear stress test as she is unable to walk on the treadmill due to her back pain. I have encouraged her to contact me for any near syncope or syncope episodes and will consider further evaluation with loop recorder implantation at that time.   Blood pressure appears to be well controlled. Encouraged continued home monitoring. No changes were made to medications. Will continue with management with PCP. Unless her stress test is abnormal, I will see her back on a PRN basis. All questions were answered.Encouraged her to stay home and frequent hand washing in view of COVID pandemic at this time.    Jeri Lager, MSN, APRN, FNP-C [facility], [place] Office: [phone] Fax: [phone]

## 2018-09-28 ENCOUNTER — Ambulatory Visit (INDEPENDENT_AMBULATORY_CARE_PROVIDER_SITE_OTHER): Payer: Medicare Other | Admitting: Psychiatry

## 2018-09-28 ENCOUNTER — Encounter: Payer: Self-pay | Admitting: Psychiatry

## 2018-09-28 ENCOUNTER — Other Ambulatory Visit: Payer: Self-pay

## 2018-09-28 DIAGNOSIS — F411 Generalized anxiety disorder: Secondary | ICD-10-CM

## 2018-09-28 DIAGNOSIS — F3110 Bipolar disorder, current episode manic without psychotic features, unspecified: Secondary | ICD-10-CM

## 2018-09-28 DIAGNOSIS — F5105 Insomnia due to other mental disorder: Secondary | ICD-10-CM | POA: Diagnosis not present

## 2018-09-28 DIAGNOSIS — G43009 Migraine without aura, not intractable, without status migrainosus: Secondary | ICD-10-CM | POA: Diagnosis not present

## 2018-09-28 MED ORDER — CARBAMAZEPINE ER 200 MG PO CP12: 200.0000 mg | ORAL_CAPSULE | Freq: Every day | ORAL | 0 refills | Status: DC

## 2018-09-28 MED ORDER — LAMOTRIGINE 100 MG PO TABS: ORAL_TABLET | ORAL | 0 refills | Status: DC

## 2018-09-28 NOTE — Progress Notes (Signed)
Kelli Morales [id number] 07/25/1950 68 y.o.  Subjective:   Patient ID:  Kelli Morales is a 68 y.o. (DOB 12-21-1950) female.  Chief Complaint:  Chief Complaint  Patient presents with  . Follow-up    Medication Management  . Other    mood swings   . Anxiety    increased    Anxiety  Symptoms include nervous/anxious behavior. Patient reports no confusion, decreased concentration or suicidal ideas.      Kelli Morales presents to the office today for follow-up of bipolar disorder an insomnia chronically.   Last seen August 10, 2018 at which time we started [facility] for mania/hypomania.  Can feel some difference with the Equetro 100 but need an increase.  Not sleeping well bc hurt her back. Getting at least 6 hours.  Not tired bc not working.  Excessive cleaning to try to get tired.  Up late.  Anxiety over it.  Covid anxiety too.  BP up.  More HA.  Less mood swings.  Is spending too much and does things she doesn't ermember. Occ loses control.  Some depression.  Less social DT covid.  Problems with concentration and memory and get lost driving at times.  Broke out crying.  Forget to do things.  Moving  And M had surgery.  Chronic pain after fall in Feb.  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  Cycles from hyper to depressed under stress it's worse.  Fighting with [employer] over settlement.  Get hyper if late to somewhere.  Stressed out.  Can't move forward.  Sleep good with trazadone 300 and clonazepam.    Pt reports that mood is anxious and hyper and describes anxiety as Moderate. Anxiety symptoms include: Excessive Worry,. Pt reports some sleep issues. Pt reports that appetite is good. Pt reports that energy is good and good. Concentration is down slightly. Suicidal thoughts:  denied by patient.  Past Psychiatric Medication Trials: Vraylar early weight gain.  Perphenazine 8 mg, quetiapine 300 mg, Abilify 10, lithium, Geodon, Depakote, CBZ remotely but this is  uncertain,  Risperidone, latuda 5# weight gain.   Mirtazapine, paroxetine, Lexapro, sertraline, duloxetine 120,    Review of Systems:  Review of Systems  Musculoskeletal: Positive for arthralgias and back pain.  Neurological: Positive for headaches. Negative for tremors and weakness.  Psychiatric/Behavioral: Negative for agitation, behavioral problems, confusion, decreased concentration, dysphoric mood, hallucinations, self-injury, sleep disturbance and suicidal ideas. The patient is nervous/anxious. The patient is not hyperactive.   several episodes passing out from poor hydration and eating.  Medications: I have reviewed the patient's current medications.  Current Outpatient Medications  Medication Sig Dispense Refill  . ACETAMINOPHEN 8 HOUR PO Take 650 mg by mouth as needed.    . Ascorbic Acid (VITAMIN C PO) Take 1 tablet by mouth daily.    Marland Kitchen aspirin 81 MG tablet Take 325 mg by mouth 3 (three) times a week. Min, Wed, Fri    . baclofen (LIORESAL) 10 MG tablet Take 10 mg by mouth 3 (three) times daily.    Marland Kitchen CALCIUM PO Take 1 tablet by mouth daily.    . Carbamazepine (EQUETRO) 200 MG CP12 12 hr capsule Take 1 capsule (200 mg total) by mouth at bedtime. 90 capsule 0  . cholecalciferol (VITAMIN D) 1000 UNITS tablet Take 1,000 Units by mouth 2 (two) times daily.     . clonazePAM (KLONOPIN) 0.5 MG tablet Take 1 tablet (0.5 mg total) by mouth 3 (three)  times daily as needed for anxiety (and insomnia). 270 tablet 0  . denosumab (PROLIA) 60 MG/ML SOLN injection Inject 60 mg into the skin every 6 (six) months. Administer in upper arm, thigh, or abdomen    . fluticasone (FLONASE) 50 MCG/ACT nasal spray Place 2 sprays into the nose daily.    . Fluticasone Furoate (ARNUITY ELLIPTA IN) Inhale 1 puff into the lungs at bedtime.     . Glycerin-Hypromellose-PEG 400 ([facility] DRY EYE RELIEF OP) Apply 1 drop to eye at bedtime as needed (dry eyes).    Marland Kitchen guaiFENesin (MUCINEX PO) Take 1 tablet by mouth as needed.      . IRON PO Take 1 tablet by mouth daily.    Marland Kitchen lamoTRIgine (LAMICTAL) 100 MG tablet TAKE 1 TABLET BY MOUTH IN  THE MORNING AND 2 TABLETS  AT BEDTIME. 270 tablet 0  . magnesium gluconate (MAGONATE) 500 MG tablet Take 500 mg by mouth daily.    . Multiple Vitamin (MULTIVITAMIN WITH MINERALS) TABS Take 1 tablet by mouth daily.    . Multiple Vitamins-Minerals (COMPLETE MULTIVITAMIN/MINERAL PO) Take 1 tablet by mouth daily.    Marland Kitchen olmesartan (BENICAR) 40 MG tablet Take 20 mg by mouth daily.     . pantoprazole (PROTONIX) 40 MG tablet Take 40 mg by mouth daily.    . phenylephrine (SUDAFED PE) 10 MG TABS tablet Take 10 mg by mouth every 4 (four) hours as needed.     . polyethylene glycol (MIRALAX / GLYCOLAX) packet Take 17 g by mouth 3 (three) times a week.    . promethazine (PHENERGAN) 25 MG tablet Take 25 mg by mouth every 6 (six) hours as needed. Nausea    . tiZANidine (ZANAFLEX) 2 MG tablet Take 2 mg by mouth as needed (migraines).     . topiramate (TOPAMAX) 200 MG tablet Take 200 mg by mouth 2 (two) times daily.     . traMADol (ULTRAM) 50 MG tablet Take 1-2 tablets by mouth as needed.    . traZODone (DESYREL) 100 MG tablet TAKE 3 TABLETS BY MOUTH  EVERY NIGHT AT BEDTIME 270 tablet 0  . zonisamide (ZONEGRAN) 100 MG capsule Take 300 mg by mouth daily.     . fish oil-omega-3 fatty acids 1000 MG capsule Take 1 g by mouth daily.     No current facility-administered medications for this visit.     Medication Side Effects: None  Allergies:  Allergies  Allergen Reactions  . Dilaudid [Hydromorphone Hcl] Other (See Comments)    Nightmares  . Divalproex Sodium     Became comatose when she took Depakote and Geodon together.  Lindajo Royal [Ziprasidone Hydrochloride]     Became comatose when she took Depakote and Geodon together.  . Nsaids     "kidney problem"  . Perphenazine-Amitriptyline Itching  . Seroquel [Quetiapine Fumerate]     Past Medical History:  Diagnosis Date  . Anxiety   . Asthma   .  Bipolar disorder ([place])   . Cataracts, bilateral   . Depression   . GERD (gastroesophageal reflux disease)   . Hydatid cyst    Left  . Hypertension   . Kidney problem    RT kidney is smaller -> decreased output  . Migraine   . Osteoporosis 11/2016   T score -2.8  improved from prior DEXA  . Right rotator cuff tear     Family History  Problem Relation Age of Onset  . Diabetes Father   . Hypertension Father   .  Heart disease Father   . Stroke Father   . Cancer Father        Lymphoma  . Hypertension Mother   . Heart disease Mother   . Stroke Mother   . Stroke Paternal Uncle   . Heart disease Brother     Social History   Socioeconomic History  . Marital status: Divorced    Spouse name: Not on file  . Number of children: 1  . Years of education: MSN  . Highest education level: Not on file  Occupational History  . Not on file  Social Needs  . Financial resource strain: Not on file  . Food insecurity:    Worry: Not on file    Inability: Not on file  . Transportation needs:    Medical: Not on file    Non-medical: Not on file  Tobacco Use  . Smoking status: Never Smoker  . Smokeless tobacco: Never Used  Substance and Sexual Activity  . Alcohol use: Yes    Alcohol/week: 0.0 standard drinks    Comment: Rare  . Drug use: No  . Sexual activity: Not Currently    Birth control/protection: Post-menopausal    Comment: 1st intercourse 47 yo-2 partners  Lifestyle  . Physical activity:    Days per week: Not on file    Minutes per session: Not on file  . Stress: Not on file  Relationships  . Social connections:    Talks on phone: Not on file    Gets together: Not on file    Attends religious service: Not on file    Active member of club or organization: Not on file    Attends meetings of clubs or organizations: Not on file    Relationship status: Not on file  . Intimate partner violence:    Fear of current or ex partner: Not on file    Emotionally abused: Not on  file    Physically abused: Not on file    Forced sexual activity: Not on file  Other Topics Concern  . Not on file  Social History Narrative   Lives with husband and daughter   Caffeine use: 2 cups per day   Right handed    Past Medical History, Surgical history, Social history, and Family history were reviewed and updated as appropriate.   Please see review of systems for further details on the patient's review from today.   Objective:   Physical Exam:  There were no vitals taken for this visit.  Physical Exam Constitutional:      General: She is not in acute distress.    Appearance: She is well-developed.  Musculoskeletal:        General: No deformity.  Neurological:     Mental Status: She is alert and oriented to person, place, and time.     Motor: No tremor.     Coordination: Coordination normal.     Gait: Gait normal.  Psychiatric:        Attention and Perception: She is attentive. She does not perceive auditory hallucinations.        Mood and Affect: Mood is anxious and elated. Mood is not depressed. Affect is not labile, blunt, angry or inappropriate.        Speech: Speech normal.        Behavior: Behavior normal.        Thought Content: Thought content normal. Thought content is not paranoid. Thought content does not include homicidal or suicidal ideation. Thought content does  not include homicidal or suicidal plan.        Cognition and Memory: She exhibits impaired recent memory.        Judgment: Judgment normal.     Comments: Insight intact. No auditory or visual hallucinations. No delusions.      Lab Review:     Component Value Date/Time   NA 141 07/15/2018 0559   K 4.0 07/15/2018 0559   CL 114 (H) 07/15/2018 0559   CO2 23 07/15/2018 0559   GLUCOSE 95 07/15/2018 0559   BUN 15 07/15/2018 0559   CREATININE 1.39 (H) 07/15/2018 0559   CALCIUM 9.2 07/15/2018 0559   PROT 6.0 (L) 07/13/2018 0610   ALBUMIN 3.3 (L) 07/14/2018 0653   AST 21 07/13/2018 0610    ALT 20 07/13/2018 0610   ALKPHOS 61 07/13/2018 0610   BILITOT 0.5 07/13/2018 0610   GFRNONAA 39 (L) 07/15/2018 0559   GFRAA 45 (L) 07/15/2018 0559       Component Value Date/Time   WBC 5.5 07/14/2018 0653   RBC 3.51 (L) 07/14/2018 0653   HGB 10.4 (L) 07/14/2018 0653   HCT 34.4 (L) 07/14/2018 0653   PLT 142 (L) 07/14/2018 0653   MCV 98.0 07/14/2018 0653   MCH 29.6 07/14/2018 0653   MCHC 30.2 07/14/2018 0653   RDW 11.9 07/14/2018 0653   LYMPHSABS 1.6 07/14/2018 0653   MONOABS 0.4 07/14/2018 0653   EOSABS 0.2 07/14/2018 0653   BASOSABS 0.0 07/14/2018 0653    No results found for: POCLITH, LITHIUM   No results found for: PHENYTOIN, PHENOBARB, VALPROATE, CBMZ   .res Assessment: Plan:    Bipolar I disorder, most recent episode (or current) manic ([place])  Generalized anxiety disorder  Insomnia due to mental condition  Migraine without aura and without status migrainosus, not intractable  Please see After Visit Summary for patient specific instructions.  Greater than 50% of face to face time with patient was spent on counseling and coordination of care. We discussed unstable bipolar with mixed symptoms and how she is not on traditionally potent mood stabilizer.  She is very sensitive to some mood stabilizers and is very sensitive about her weight and anything that might cause weight gain.  She also is med sensitive complicating treatment.  She is currently hypomanic to manic.  This is causing the memory problems as well as the insomnia etc.  No other options except  CBZ and oxcarbazapine with low weight gain risk unless haldol.  Disc SE each in detail including TD.  Discussed potential metabolic side effects associated with atypical antipsychotics, as well as potential risk for movement side effects. Advised pt to contact office if movement side effects occur.  Equetro.  Disc risk SJS etc. With it.  Discussed drug interaction issues and we will need to increase the lamotrigine and  if she tolerates this medication.  We will defer changing the lamotrigine because she is not currently depressed. Samples Equetro 100 increase to 200 each night  And may need to go higher.  We discussed the short-term risks associated with benzodiazepines including sedation and increased fall risk among others.  Discussed long-term side effect risk including dependence, potential withdrawal symptoms, and the potential eventual dose-related risk of dementia.  Ok continue  increase clonazepam, since not a high dosage.  Hope to use it extra just temporarily.  Send in prescription for clonazepam 0.5 mg 3 times daily as needed anxiety or insomnia.  Use the clonazepam to help improve sleep quality quickly because the  insomnia is feeling the mania.  Later we will cut back on the clonazepam.  If the increase in Equetro was helpful for anxiety then gradually reduce the clonazepam.  This appointment was 30 minutes  Follow-up 6 weeks  Lynder Parents MD, DFAPA   No future appointments.  No orders of the defined types were placed in this encounter.     -------------------------------

## 2018-09-29 ENCOUNTER — Telehealth: Payer: Self-pay | Admitting: Psychiatry

## 2018-09-29 ENCOUNTER — Other Ambulatory Visit: Payer: Self-pay | Admitting: Psychiatry

## 2018-09-29 MED ORDER — CARBAMAZEPINE ER 100 MG PO TB12: ORAL_TABLET | ORAL | 1 refills | Status: DC

## 2018-09-29 NOTE — Telephone Encounter (Signed)
Patient called and said that the [facility] you prescribesd was over 400 dollars for a 90 day supply and over 50 for a 30 day supply even in the generic. She can't afford it please call her with other opptions she can try. [phone]

## 2018-09-29 NOTE — Telephone Encounter (Signed)
Called and pt. States that She understands and would like [facility] [address].

## 2018-09-29 NOTE — Telephone Encounter (Signed)
She has Medicare and cannot use discount card.  Explained to her about good Rx.  I will prescribe the Tegretol-XR generic 100 mg.  Asked her where she wants it to be sent in.  The good Rx price at [facility] is $34, [facility], [facility] $36 [facility] $38  Carbamazepine XR 100 mg tablets 1 daily for 1 week then 2 q. at bedtime for 1 week then 3 nightly #90 no refills let her know that may not be enough we might have to go higher.  When she is on this dosage and tolerates it well we might possibly be able to switch to an even cheaper 1 but it is hard to get used to it is better to start the extended release initially and then switch to immediate release if he have to.

## 2018-09-30 ENCOUNTER — Encounter: Payer: Self-pay | Admitting: *Deleted

## 2018-10-04 ENCOUNTER — Encounter: Payer: Self-pay | Admitting: *Deleted

## 2018-10-04 ENCOUNTER — Telehealth: Payer: Self-pay | Admitting: Psychiatry

## 2018-10-04 NOTE — Telephone Encounter (Signed)
The prescription for the carbamazepine says one for 5 nights, then 2 for 5 nights then 3 each night.  That is a very low dose.  The primary side effect risk of carbamazepine is dizziness or perhaps sleepiness which is why she takes it at night.  That should not cause much problem t trouble during the day.  It has a low risk of weight gain and there are few mood stabilizers left for her to try that have a low risk of weight gain that is why we picked it.  There is exceedingly rare risks of a severe allergic reaction similar to the rarity of the reaction like that with lamotrigine like 1 in several thousand people.  I have actually never seen it.  There is a low 1 in several thousand risk of causing some type of anemia but it so rare that we do not routinely check for blood counts but we can do it in her if she wants to.  My recommendation is not to do that Covid it is past.  She has other specific questions let me know.

## 2018-10-04 NOTE — Telephone Encounter (Signed)
VM is full. Will try calling again.

## 2018-10-04 NOTE — Telephone Encounter (Signed)
Pt on carbamazepine IR she said there is a plan to increase her dose. She has read the side effects and is concerned. She would like to talk to someone.

## 2018-10-05 NOTE — Telephone Encounter (Signed)
Called and made pt. Aware. She verbalized understanding and feels more comfortable with taking the medication now.

## 2018-10-07 ENCOUNTER — Encounter: Payer: Self-pay | Admitting: *Deleted

## 2018-10-23 ENCOUNTER — Other Ambulatory Visit: Payer: Self-pay | Admitting: Psychiatry

## 2018-10-26 ENCOUNTER — Telehealth: Payer: Self-pay | Admitting: Psychiatry

## 2018-10-26 NOTE — Telephone Encounter (Signed)
Patient called and said that ever since she increased the carbamazapine she has been experiencing brain fogs and is sleepy during the day. She just can't do this anymore.Please call her with what to do next at [phone]

## 2018-10-26 NOTE — Telephone Encounter (Signed)
She is failed or been intolerant to multiple medications.  I will have the staff schedule her an appointment.

## 2018-10-27 ENCOUNTER — Encounter: Payer: Self-pay | Admitting: Psychiatry

## 2018-10-27 ENCOUNTER — Ambulatory Visit (INDEPENDENT_AMBULATORY_CARE_PROVIDER_SITE_OTHER): Payer: Medicare Other | Admitting: Psychiatry

## 2018-10-27 ENCOUNTER — Other Ambulatory Visit: Payer: Self-pay

## 2018-10-27 DIAGNOSIS — F5105 Insomnia due to other mental disorder: Secondary | ICD-10-CM | POA: Diagnosis not present

## 2018-10-27 DIAGNOSIS — G43009 Migraine without aura, not intractable, without status migrainosus: Secondary | ICD-10-CM

## 2018-10-27 DIAGNOSIS — F411 Generalized anxiety disorder: Secondary | ICD-10-CM

## 2018-10-27 DIAGNOSIS — F3110 Bipolar disorder, current episode manic without psychotic features, unspecified: Secondary | ICD-10-CM | POA: Diagnosis not present

## 2018-10-27 MED ORDER — CLONAZEPAM 0.5 MG PO TABS: 0.5000 mg | ORAL_TABLET | Freq: Three times a day (TID) | ORAL | 0 refills | Status: DC | PRN

## 2018-10-27 NOTE — Progress Notes (Signed)
Kelli Morales [id number] 1950/09/30 68 y.o.   Virtual Visit via Telephone Note  I connected with pt by telephone and verified that I am speaking with the correct person using two identifiers.   I discussed the limitations, risks, security and privacy concerns of performing an evaluation and management service by telephone and the availability of in person appointments. I also discussed with the patient that there may be a patient responsible charge related to this service. The patient expressed understanding and agreed to proceed.  I discussed the assessment and treatment plan with the patient. The patient was provided an opportunity to ask questions and all were answered. The patient agreed with the plan and demonstrated an understanding of the instructions.   The patient was advised to call back or seek an in-person evaluation if the symptoms worsen or if the condition fails to improve as anticipated.  I provided 25 minutes of non-face-to-face time during this encounter. The call started at 1:00 and ended at 125. The patient was located at home and the provider was located office.   Subjective:   Patient ID:  Kelli Morales is a 68 y.o. (DOB 1951/05/22) female.  Chief Complaint:  Chief Complaint  Patient presents with  . Follow-up    Medication Management  . Depression    Medication Management  . Medication Problem    Anxiety  Symptoms include nervous/anxious behavior. Patient reports no confusion, decreased concentration or suicidal ideas.    Depression         Associated symptoms include headaches.  Associated symptoms include no decreased concentration and no suicidal ideas.  Past medical history includes anxiety.     Ylianna Almanzar Laursen presents to the office today for follow-up of bipolar disorder an insomnia chronically.   Last visit was September 28, 2018.  She was started on Equetro for mood stabilization.  She called back the next day stating it would be $400 for 90-day  supply and she wanted to try the generic which we did.  She was somewhat reluctant to take it at all after reading about potential side effects.  She was given reassurance about what is more actually more typical rather than what is rare and she committed to trying it.  However 5 days later she called wanting to change mood stabilizers again.  She complained of having brain fog and sleepiness.  Noticed it at 200 but really a problem at 300 with excessive sleepiness in the daytime.  Sleep fine at 100 mg  The last few days.  Better function.  Better schedule recently helped sleep and mood.  Mood is much better lately.  Not sig manic now.  Not sure how the mania occurred unless Covid triggered.   Anxiety is periodic and related to situations.  Better working with anxiety.  Staying alone causes some anxiety.     Still some problems with her Problems with concentration and memory and get lost driving at times but only new places now.  Got lost driving to new place last time.  Moved lately.    And M had surgery.  Chronic pain after fall in Feb.  Pt reports that mood is anxious and hyper and describes anxiety as Moderate. Anxiety symptoms include: Excessive Worry,. Pt reports some sleep issues. Pt reports that appetite is good. Pt reports that energy is good and good. Concentration is down slightly. Suicidal thoughts:  denied by patient.  Past Psychiatric Medication Trials: Vraylar early weight gain.  Perphenazine 8 mg, quetiapine 300  mg, Abilify 10, lithium, Geodon, Depakote, CBZ remotely but this is uncertain,  Risperidone, latuda 5# weight gain, CBZ 300 sleepiness. Mirtazapine, paroxetine, Lexapro, sertraline, duloxetine 120,    Review of Systems:  Review of Systems  Musculoskeletal: Positive for arthralgias and back pain.  Neurological: Positive for headaches. Negative for tremors and weakness.  Psychiatric/Behavioral: Positive for depression. Negative for agitation, behavioral problems, confusion,  decreased concentration, dysphoric mood, hallucinations, self-injury, sleep disturbance and suicidal ideas. The patient is nervous/anxious. The patient is not hyperactive.   several episodes passing out from poor hydration and eating.  Medications: I have reviewed the patient's current medications.  Current Outpatient Medications  Medication Sig Dispense Refill  . ACETAMINOPHEN 8 HOUR PO Take 650 mg by mouth as needed.    Marland Kitchen amLODipine (NORVASC) 5 MG tablet Take 5 mg by mouth daily.    . Ascorbic Acid (VITAMIN C PO) Take 1 tablet by mouth daily.    Marland Kitchen aspirin 81 MG tablet Take 325 mg by mouth 3 (three) times a week. Min, Wed, Fri    . baclofen (LIORESAL) 10 MG tablet Take 10 mg by mouth 3 (three) times daily.    Marland Kitchen CALCIUM PO Take 1 tablet by mouth daily.    . carbamazepine (TEGRETOL XR) 100 MG 12 hr tablet TAKE 1 TABLET NIGHTLY FOR 5 NIGHTS, THEN 2 TABLETS NIGHTLY FOR 5 NIGHTS THEN 3 TABLETS NIGHTLY (Patient taking differently: Take 100 mg by mouth at bedtime. TAKE 1 TABLET NIGHTLY FOR 5 NIGHTS, THEN 2 TABLETS NIGHTLY FOR 5 NIGHTS THEN 3 TABLETS NIGHTLY) 90 tablet 1  . cholecalciferol (VITAMIN D) 1000 UNITS tablet Take 1,000 Units by mouth 2 (two) times daily.     . clonazePAM (KLONOPIN) 0.5 MG tablet Take 1 tablet (0.5 mg total) by mouth 3 (three) times daily as needed for anxiety (and insomnia). 270 tablet 0  . denosumab (PROLIA) 60 MG/ML SOLN injection Inject 60 mg into the skin every 6 (six) months. Administer in upper arm, thigh, or abdomen    . fish oil-omega-3 fatty acids 1000 MG capsule Take 1 g by mouth daily.    . fluticasone (FLONASE) 50 MCG/ACT nasal spray Place 2 sprays into the nose daily.    . Fluticasone Furoate (ARNUITY ELLIPTA IN) Inhale 1 puff into the lungs at bedtime.     . Glycerin-Hypromellose-PEG 400 ([facility] DRY EYE RELIEF OP) Apply 1 drop to eye at bedtime as needed (dry eyes).    Marland Kitchen guaiFENesin (MUCINEX PO) Take 1 tablet by mouth as needed.     . IRON PO Take 1 tablet by  mouth daily.    Marland Kitchen lamoTRIgine (LAMICTAL) 100 MG tablet TAKE 1 TABLET BY MOUTH IN  THE MORNING AND 2 TABLETS  AT BEDTIME. 270 tablet 0  . magnesium gluconate (MAGONATE) 500 MG tablet Take 500 mg by mouth daily.    . Multiple Vitamin (MULTIVITAMIN WITH MINERALS) TABS Take 1 tablet by mouth daily.    . Multiple Vitamins-Minerals (COMPLETE MULTIVITAMIN/MINERAL PO) Take 1 tablet by mouth daily.    Marland Kitchen olmesartan (BENICAR) 40 MG tablet Take 40 mg by mouth daily.     . pantoprazole (PROTONIX) 40 MG tablet Take 40 mg by mouth daily.    . phenylephrine (SUDAFED PE) 10 MG TABS tablet Take 10 mg by mouth every 4 (four) hours as needed.     . polyethylene glycol (MIRALAX / GLYCOLAX) packet Take 17 g by mouth 3 (three) times a week.    . promethazine (PHENERGAN) 25 MG  tablet Take 25 mg by mouth every 6 (six) hours as needed. Nausea    . topiramate (TOPAMAX) 200 MG tablet Take 200 mg by mouth 2 (two) times daily.     . traMADol (ULTRAM) 50 MG tablet Take 1-2 tablets by mouth as needed.    . traZODone (DESYREL) 100 MG tablet TAKE 3 TABLETS BY MOUTH  EVERY NIGHT AT BEDTIME 270 tablet 0  . zonisamide (ZONEGRAN) 100 MG capsule Take 300 mg by mouth daily.      No current facility-administered medications for this visit.     Medication Side Effects: None  Allergies:  Allergies  Allergen Reactions  . Dilaudid [Hydromorphone Hcl] Other (See Comments)    Nightmares  . Divalproex Sodium     Became comatose when she took Depakote and Geodon together.  Lindajo Royal [Ziprasidone Hydrochloride]     Became comatose when she took Depakote and Geodon together.  . Nsaids     "kidney problem"  . Perphenazine-Amitriptyline Itching  . Seroquel [Quetiapine Fumerate]     Past Medical History:  Diagnosis Date  . Anxiety   . Asthma   . Bipolar disorder ([place])   . Cataracts, bilateral   . Depression   . GERD (gastroesophageal reflux disease)   . Hydatid cyst    Left  . Hypertension   . Kidney problem    RT kidney is  smaller -> decreased output  . Migraine   . Osteoporosis 11/2016   T score -2.8  improved from prior DEXA  . Right rotator cuff tear     Family History  Problem Relation Age of Onset  . Diabetes Father   . Hypertension Father   . Heart disease Father   . Stroke Father   . Cancer Father        Lymphoma  . Hypertension Mother   . Heart disease Mother   . Stroke Mother   . Stroke Paternal Uncle   . Heart disease Brother     Social History   Socioeconomic History  . Marital status: Divorced    Spouse name: Not on file  . Number of children: 1  . Years of education: MSN  . Highest education level: Not on file  Occupational History  . Not on file  Social Needs  . Financial resource strain: Not on file  . Food insecurity:    Worry: Not on file    Inability: Not on file  . Transportation needs:    Medical: Not on file    Non-medical: Not on file  Tobacco Use  . Smoking status: Never Smoker  . Smokeless tobacco: Never Used  Substance and Sexual Activity  . Alcohol use: Yes    Alcohol/week: 0.0 standard drinks    Comment: Rare  . Drug use: No  . Sexual activity: Not Currently    Birth control/protection: Post-menopausal    Comment: 1st intercourse 58 yo-2 partners  Lifestyle  . Physical activity:    Days per week: Not on file    Minutes per session: Not on file  . Stress: Not on file  Relationships  . Social connections:    Talks on phone: Not on file    Gets together: Not on file    Attends religious service: Not on file    Active member of club or organization: Not on file    Attends meetings of clubs or organizations: Not on file    Relationship status: Not on file  . Intimate partner violence:  Fear of current or ex partner: Not on file    Emotionally abused: Not on file    Physically abused: Not on file    Forced sexual activity: Not on file  Other Topics Concern  . Not on file  Social History Narrative   Lives with husband and daughter   Caffeine  use: 2 cups per day   Right handed    Past Medical History, Surgical history, Social history, and Family history were reviewed and updated as appropriate.   Please see review of systems for further details on the patient's review from today.   Objective:   Physical Exam:  There were no vitals taken for this visit.  Physical Exam Neurological:     Mental Status: She is alert and oriented to person, place, and time.     Cranial Nerves: No dysarthria.  Psychiatric:        Attention and Perception: Attention normal.        Mood and Affect: Mood is anxious. Mood is not depressed. Affect is not angry.        Speech: Speech normal.        Behavior: Behavior is cooperative.        Thought Content: Thought content normal. Thought content is not paranoid or delusional. Thought content does not include homicidal or suicidal ideation. Thought content does not include homicidal or suicidal plan.        Cognition and Memory: Cognition and memory normal.        Judgment: Judgment normal.     Comments: She reports manic symptoms have resolved. Insight fair.     Lab Review:     Component Value Date/Time   NA 141 07/15/2018 0559   K 4.0 07/15/2018 0559   CL 114 (H) 07/15/2018 0559   CO2 23 07/15/2018 0559   GLUCOSE 95 07/15/2018 0559   BUN 15 07/15/2018 0559   CREATININE 1.39 (H) 07/15/2018 0559   CALCIUM 9.2 07/15/2018 0559   PROT 6.0 (L) 07/13/2018 0610   ALBUMIN 3.3 (L) 07/14/2018 0653   AST 21 07/13/2018 0610   ALT 20 07/13/2018 0610   ALKPHOS 61 07/13/2018 0610   BILITOT 0.5 07/13/2018 0610   GFRNONAA 39 (L) 07/15/2018 0559   GFRAA 45 (L) 07/15/2018 0559       Component Value Date/Time   WBC 5.5 07/14/2018 0653   RBC 3.51 (L) 07/14/2018 0653   HGB 10.4 (L) 07/14/2018 0653   HCT 34.4 (L) 07/14/2018 0653   PLT 142 (L) 07/14/2018 0653   MCV 98.0 07/14/2018 0653   MCH 29.6 07/14/2018 0653   MCHC 30.2 07/14/2018 0653   RDW 11.9 07/14/2018 0653   LYMPHSABS 1.6 07/14/2018  0653   MONOABS 0.4 07/14/2018 0653   EOSABS 0.2 07/14/2018 0653   BASOSABS 0.0 07/14/2018 0653    No results found for: POCLITH, LITHIUM   No results found for: PHENYTOIN, PHENOBARB, VALPROATE, CBMZ   .res Assessment: Plan:    Bipolar I disorder, most recent episode (or current) manic ([place])  Generalized anxiety disorder  Insomnia due to mental condition  Migraine without aura and without status migrainosus, not intractable   Greater than 50% of face to face time with patient was spent on counseling and coordination of care. We discussed unstable bipolar with mixed symptoms and how she is not on traditionally potent mood stabilizer.  She is very sensitive to some mood stabilizers and is very sensitive about her weight and anything that might cause weight gain.  She also is med sensitive complicating treatment.  She is not very patient with medication trials and gives up easily which is unfortunate because we do not have a lot of options left.  She was recently hypomanic to manic.  This has resolved with carbamazepine.  She is only been at 100 mg for a few days.  She may have a relapse into mania because the dosage is so low.  However, she clearly is very sensitive to it judging by the degree of sleepiness that she was getting even at 200 mg nightly. Therefore we will continue carbamazepine XR 100 mg nightly  No other options except  CBZ and oxcarbazapine with low weight gain risk unless haldol.  Recommend as soon as possible she try reducing trazodone 300 mg nightly to 200 mg nightly as the higher dose may contribute to mood cycling and manic risk.  Specifically since she is sleeping better with the carbamazepine this should be easier to accomplish.     We discussed the short-term risks associated with benzodiazepines including sedation and increased fall risk among others.  Discussed long-term side effect risk including dependence, potential withdrawal symptoms, and the potential  eventual dose-related risk of dementia.  Ok continue  increase clonazepam, since not a high dosage.  Hope to use it extra just temporarily.  Send in prescription for clonazepam 0.5 mg 3 times daily as needed anxiety or insomnia.  Use the clonazepam to help improve sleep quality quickly because the insomnia is feeling the mania.  Later we will cut back on the clonazepam.  If the increase in Equetro was helpful for anxiety then gradually reduce the clonazepam.  This appointment was 30 minutes  Follow-up 3 mos  Lynder Parents MD, DFAPA   No future appointments.  No orders of the defined types were placed in this encounter.     -------------------------------

## 2018-11-15 ENCOUNTER — Other Ambulatory Visit: Payer: Self-pay | Admitting: Psychiatry

## 2018-12-01 ENCOUNTER — Other Ambulatory Visit: Payer: Self-pay | Admitting: Psychiatry

## 2018-12-04 ENCOUNTER — Other Ambulatory Visit: Payer: Self-pay | Admitting: Psychiatry

## 2019-01-13 ENCOUNTER — Telehealth: Payer: Self-pay | Admitting: Psychiatry

## 2019-01-13 ENCOUNTER — Other Ambulatory Visit: Payer: Self-pay

## 2019-01-13 DIAGNOSIS — F411 Generalized anxiety disorder: Secondary | ICD-10-CM

## 2019-01-13 MED ORDER — CLONAZEPAM 0.5 MG PO TABS: 0.5000 mg | ORAL_TABLET | Freq: Three times a day (TID) | ORAL | 0 refills | Status: DC | PRN

## 2019-01-13 NOTE — Telephone Encounter (Signed)
Pt requesting a refill on the Klonopin 0.5mg . Please fill by [facility].

## 2019-01-13 NOTE — Telephone Encounter (Signed)
Last refill 10/29/2018 90 day, pended for approval

## 2019-01-26 ENCOUNTER — Telehealth: Payer: Self-pay | Admitting: Psychiatry

## 2019-01-26 NOTE — Telephone Encounter (Signed)
Patient wants to know if she can take an additional dose of Klonapin to help with anxiety, her and her daughter are going through some things right now. This is causing anxiety for her at this time

## 2019-01-27 ENCOUNTER — Telehealth: Payer: Self-pay | Admitting: Psychiatry

## 2019-01-27 NOTE — Telephone Encounter (Signed)
Pharmacy left v-mail. Clonazapam 0.5 mg order was returned by carrier. Order was resend any questions call [phone]  Ref # [id number]

## 2019-01-29 ENCOUNTER — Other Ambulatory Visit: Payer: Self-pay | Admitting: Psychiatry

## 2019-01-31 NOTE — Telephone Encounter (Signed)
The prescription for clonazepam is written for 0.5 mg up to 3 times a day.  She should not exceed that dosage.  It can adversely affect her memory which is been ongoing concern she has had in the past.

## 2019-02-02 NOTE — Telephone Encounter (Signed)
Left Pt. A VM to return my call.

## 2019-02-02 NOTE — Telephone Encounter (Signed)
Pt. Made aware and verbalized understanding.

## 2019-03-16 ENCOUNTER — Encounter: Payer: Self-pay | Admitting: Gynecology

## 2019-03-18 ENCOUNTER — Telehealth: Payer: Self-pay | Admitting: Psychiatry

## 2019-03-18 NOTE — Telephone Encounter (Signed)
Patient went up on the Tegrotol she is taking 3 at night is causing her to itch, would like for medication to be decreased

## 2019-03-18 NOTE — Telephone Encounter (Signed)
Carbamazepine's can sometimes cause itching.  As long as she has no severe rash, in which case she needs to notify us, then she can reduce the Tegretol to 2 each night.  If she is not on an allergy medicine then start Zyrtec 1 each night which will help her get rid of the itch faster.

## 2019-03-18 NOTE — Telephone Encounter (Signed)
Left detailed information on her personal voicemail and to call back with further questions or concerns.

## 2019-05-02 ENCOUNTER — Telehealth: Payer: Self-pay | Admitting: Psychiatry

## 2019-05-02 ENCOUNTER — Other Ambulatory Visit: Payer: Self-pay

## 2019-05-02 DIAGNOSIS — F411 Generalized anxiety disorder: Secondary | ICD-10-CM

## 2019-05-02 NOTE — Telephone Encounter (Signed)
Pt needs a refill on her Clonazepam. She is switching from mail order and would like it sent in at [facility] [address] in [place].

## 2019-05-02 NOTE — Telephone Encounter (Signed)
Patient now has apt scheduled

## 2019-05-02 NOTE — Telephone Encounter (Signed)
Last refill 01/27/2019 #270

## 2019-05-02 NOTE — Telephone Encounter (Signed)
Noted Rx pended for refill

## 2019-05-03 MED ORDER — CLONAZEPAM 0.5 MG PO TABS: 0.5000 mg | ORAL_TABLET | Freq: Three times a day (TID) | ORAL | 0 refills | Status: DC | PRN

## 2019-05-26 ENCOUNTER — Other Ambulatory Visit: Payer: Self-pay | Admitting: Psychiatry

## 2019-06-01 ENCOUNTER — Telehealth: Payer: Self-pay | Admitting: Psychiatry

## 2019-06-01 ENCOUNTER — Other Ambulatory Visit: Payer: Self-pay | Admitting: Psychiatry

## 2019-06-01 MED ORDER — CARBAMAZEPINE ER 200 MG PO TB12: 200.0000 mg | ORAL_TABLET | Freq: Every day | ORAL | 0 refills | Status: DC

## 2019-06-01 NOTE — Telephone Encounter (Signed)
RX sent

## 2019-06-01 NOTE — Telephone Encounter (Signed)
Patient called and said that she needs anew script of tegrotol 200 mg to [facility]. She has reduced to 200 mg and the cost is cheaper if it is 200 mg.

## 2019-06-09 ENCOUNTER — Ambulatory Visit: Payer: Medicare Other | Admitting: Psychiatry

## 2019-07-01 ENCOUNTER — Ambulatory Visit (INDEPENDENT_AMBULATORY_CARE_PROVIDER_SITE_OTHER): Payer: Medicare Other | Admitting: Psychiatry

## 2019-07-01 ENCOUNTER — Encounter: Payer: Self-pay | Admitting: Psychiatry

## 2019-07-01 DIAGNOSIS — F5105 Insomnia due to other mental disorder: Secondary | ICD-10-CM

## 2019-07-01 DIAGNOSIS — F411 Generalized anxiety disorder: Secondary | ICD-10-CM | POA: Diagnosis not present

## 2019-07-01 DIAGNOSIS — G43009 Migraine without aura, not intractable, without status migrainosus: Secondary | ICD-10-CM | POA: Diagnosis not present

## 2019-07-01 DIAGNOSIS — F3132 Bipolar disorder, current episode depressed, moderate: Secondary | ICD-10-CM | POA: Diagnosis not present

## 2019-07-01 MED ORDER — LAMOTRIGINE 200 MG PO TABS: 200.0000 mg | ORAL_TABLET | Freq: Two times a day (BID) | ORAL | 0 refills | Status: DC

## 2019-07-01 MED ORDER — CARBAMAZEPINE ER 200 MG PO TB12: 400.0000 mg | ORAL_TABLET | Freq: Every day | ORAL | 0 refills | Status: DC

## 2019-07-01 NOTE — Progress Notes (Signed)
[place] [id number] September 15, 1950 69 y.o.   Virtual Visit via Telephone Note  I connected with pt by telephone and verified that I am speaking with the correct person using two identifiers.   I discussed the limitations, risks, security and privacy concerns of performing an evaluation and management service by telephone and the availability of in person appointments. I also discussed with the patient that there may be a patient responsible charge related to this service. The patient expressed understanding and agreed to proceed.  I discussed the assessment and treatment plan with the patient. The patient was provided an opportunity to ask questions and all were answered. The patient agreed with the plan and demonstrated an understanding of the instructions.   The patient was advised to call back or seek an in-person evaluation if the symptoms worsen or if the condition fails to improve as anticipated.  I provided 25 minutes of non-face-to-face time during this encounter. The call started at 1:00 and ended at 125. The patient was located at home and the provider was located office.   Subjective:   Patient ID:  Kelli Morales is a 69 y.o. (DOB 05/26/1951) female.  Chief Complaint:  Chief Complaint  Patient presents with  . Follow-up    Medication Management  . Depression    Medication Management  . Other    Bipolar 1    Depression        Associated symptoms include headaches.  Associated symptoms include no decreased concentration and no suicidal ideas.  Past medical history includes anxiety.   Anxiety Symptoms include nervous/anxious behavior. Patient reports no confusion, decreased concentration, dizziness or suicidal ideas.      Kelli Morales presents to the office today for follow-up of bipolar disorder an insomnia chronically.   visit September 28, 2018.  She was started on Equetro for mood stabilization.  She called back the next day stating it would be $400 for 90-day  supply and she wanted to try the generic which we did.  She was somewhat reluctant to take it at all after reading about potential side effects.  She was given reassurance about what is more actually more typical rather than what is rare and she committed to trying it.  However 5 days later she called wanting to change mood stabilizers again.  She complained of having brain fog and sleepiness.  Noticed it at 200 but really a problem at 300 with excessive sleepiness in the daytime.  Sleep fine at 100 mg  The last few days.  Better function.  Better schedule recently helped sleep and mood.  Mood is much better lately.  Not sig manic now.  Not sure how the mania occurred unless Covid triggered.   Anxiety is periodic and related to situations.  Better working with anxiety.  Staying alone causes some anxiety.    Last seen May 2020.  She called in August wanting to increase clonazepam 0.5 mg 3 times daily to a higher dose because of conflict with her daughter.  She was told not to increase the dosage because she was already and chronically complaining of cognitive problems.  She called back in October indicated she had increased the carbamazepine to 300 mg nightly with some itching but no rash.  She was told she could reduce it to 200 mg nightly and start Zyrtec but to contact us if she had more significant rash.  During the fall 2020 she change carbamazepine to 200 mg nightly.  Not good.  Worse depression without relief for 6 weeks.  Got a new job and thought it would help but it hasn't.  Part of stress M Covid positive and worry over it but reportedly no sx.     Still some problems with her Problems with concentration and memory and get lost driving at times but only new places now.  Got lost driving to new place last time.  Moved lately.    And M had surgery.  Chronic pain after fall in Feb.  Pt reports that mood is anxious and hyper and describes anxiety as Moderate. Anxiety symptoms include: Excessive  Worry,. Pt reports some sleep issues. Pt reports that appetite is good. Pt reports that energy is good and good. Concentration is down slightly. Suicidal thoughts:  denied by patient.  Past Psychiatric Medication Trials: Vraylar early weight gain.  Perphenazine 8 mg, quetiapine 300 mg, Abilify 10, lithium, Geodon, Depakote, CBZ remotely but this is uncertain,  Risperidone, latuda 5# weight gain, CBZ 300 sleepiness. Lamotrigine 300  Mirtazapine, paroxetine, Lexapro, sertraline, duloxetine 120,   Review of Systems:  Review of Systems  Musculoskeletal: Positive for arthralgias and back pain.  Neurological: Positive for headaches. Negative for dizziness, tremors and weakness.  Psychiatric/Behavioral: Positive for depression. Negative for agitation, behavioral problems, confusion, decreased concentration, dysphoric mood, hallucinations, self-injury, sleep disturbance and suicidal ideas. The patient is nervous/anxious. The patient is not hyperactive.   several episodes passing out from poor hydration and eating.  Medications: I have reviewed the patient's current medications.  Current Outpatient Medications  Medication Sig Dispense Refill  . ACETAMINOPHEN 8 HOUR PO Take 650 mg by mouth as needed.    Marland Kitchen albuterol (VENTOLIN HFA) 108 (90 Base) MCG/ACT inhaler     . amLODipine (NORVASC) 5 MG tablet Take 5 mg by mouth daily.    . Ascorbic Acid (VITAMIN C PO) Take 1 tablet by mouth daily.    Marland Kitchen aspirin 81 MG tablet Take 325 mg by mouth 3 (three) times a week. Min, Wed, Fri    . baclofen (LIORESAL) 10 MG tablet Take 10 mg by mouth 3 (three) times daily.    Marland Kitchen CALCIUM PO Take 1 tablet by mouth daily.    . carbamazepine (TEGRETOL XR) 200 MG 12 hr tablet Take 2 tablets (400 mg total) by mouth at bedtime. 180 tablet 0  . cholecalciferol (VITAMIN D) 1000 UNITS tablet Take 1,000 Units by mouth 2 (two) times daily.     . clonazePAM (KLONOPIN) 0.5 MG tablet Take 1 tablet (0.5 mg total) by mouth 3 (three) times  daily as needed for anxiety (and insomnia). 270 tablet 0  . Cyanocobalamin (VITAMIN B 12 PO) Take by mouth.    . denosumab (PROLIA) 60 MG/ML SOLN injection Inject 60 mg into the skin every 6 (six) months. Administer in upper arm, thigh, or abdomen    . fish oil-omega-3 fatty acids 1000 MG capsule Take 1 g by mouth daily.    . fluticasone (FLONASE) 50 MCG/ACT nasal spray Place 2 sprays into the nose daily.    . Fluticasone Furoate (ARNUITY ELLIPTA IN) Inhale 1 puff into the lungs at bedtime.     . Glycerin-Hypromellose-PEG 400 ([facility] DRY EYE RELIEF OP) Apply 1 drop to eye at bedtime as needed (dry eyes).    Marland Kitchen guaiFENesin (MUCINEX PO) Take 1 tablet by mouth as needed.     . IRON PO Take 1 tablet by mouth daily.    . L-FORMULA LYSINE HCL PO Take by mouth.    Marland Kitchen  lamoTRIgine (LAMICTAL) 200 MG tablet Take 1 tablet (200 mg total) by mouth 2 (two) times daily. 180 tablet 0  . magnesium gluconate (MAGONATE) 500 MG tablet Take 500 mg by mouth daily.    . Multiple Vitamins-Minerals (COMPLETE MULTIVITAMIN/MINERAL PO) Take 1 tablet by mouth daily.    . pantoprazole (PROTONIX) 40 MG tablet Take 40 mg by mouth daily.    . phenylephrine (SUDAFED PE) 10 MG TABS tablet Take 10 mg by mouth every 4 (four) hours as needed.     . polyethylene glycol (MIRALAX / GLYCOLAX) packet Take 17 g by mouth 3 (three) times a week.    . Potassium 99 MG TABS Take by mouth.    . promethazine (PHENERGAN) 25 MG tablet Take 25 mg by mouth every 6 (six) hours as needed. Nausea    . topiramate (TOPAMAX) 200 MG tablet Take 200 mg by mouth daily.     . traMADol (ULTRAM) 50 MG tablet Take 1-2 tablets by mouth as needed.    . traZODone (DESYREL) 100 MG tablet TAKE 3 TABLETS BY MOUTH AT  BEDTIME 270 tablet 3  . zonisamide (ZONEGRAN) 100 MG capsule Take 300 mg by mouth daily.      No current facility-administered medications for this visit.    Medication Side Effects: None  Allergies:  Allergies  Allergen Reactions  . Dilaudid  [Hydromorphone Hcl] Other (See Comments)    Nightmares  . Divalproex Sodium     Became comatose when she took Depakote and Geodon together.  Lindajo Royal [Ziprasidone Hydrochloride]     Became comatose when she took Depakote and Geodon together.  . Nsaids     "kidney problem"  . Perphenazine-Amitriptyline Itching  . Seroquel [Quetiapine Fumerate]     Past Medical History:  Diagnosis Date  . Anxiety   . Asthma   . Bipolar disorder ([place])   . Cataracts, bilateral   . Depression   . GERD (gastroesophageal reflux disease)   . Hydatid cyst    Left  . Hypertension   . Kidney problem    RT kidney is smaller -> decreased output  . Migraine   . Osteoporosis 11/2016   T score -2.8  improved from prior DEXA  . Right rotator cuff tear     Family History  Problem Relation Age of Onset  . Diabetes Father   . Hypertension Father   . Heart disease Father   . Stroke Father   . Cancer Father        Lymphoma  . Hypertension Mother   . Heart disease Mother   . Stroke Mother   . Stroke Paternal Uncle   . Heart disease Brother     Social History   Socioeconomic History  . Marital status: Divorced    Spouse name: Not on file  . Number of children: 1  . Years of education: MSN  . Highest education level: Not on file  Occupational History  . Not on file  Tobacco Use  . Smoking status: Never Smoker  . Smokeless tobacco: Never Used  Substance and Sexual Activity  . Alcohol use: Yes    Alcohol/week: 0.0 standard drinks    Comment: Rare  . Drug use: No  . Sexual activity: Not Currently    Birth control/protection: Post-menopausal    Comment: 1st intercourse 38 yo-2 partners  Other Topics Concern  . Not on file  Social History Narrative   Lives with husband and daughter   Caffeine use: 2 cups per day  Right handed   Social Determinants of Health   Financial Resource Strain:   . Difficulty of Paying Living Expenses: Not on file  Food Insecurity:   . Worried About Paediatric nurse in the Last Year: Not on file  . Ran Out of Food in the Last Year: Not on file  Transportation Needs:   . Lack of Transportation (Medical): Not on file  . Lack of Transportation (Non-Medical): Not on file  Physical Activity:   . Days of Exercise per Week: Not on file  . Minutes of Exercise per Session: Not on file  Stress:   . Feeling of Stress : Not on file  Social Connections:   . Frequency of Communication with Friends and Family: Not on file  . Frequency of Social Gatherings with Friends and Family: Not on file  . Attends Religious Services: Not on file  . Active Member of Clubs or Organizations: Not on file  . Attends Archivist Meetings: Not on file  . Marital Status: Not on file  Intimate Partner Violence:   . Fear of Current or Ex-Partner: Not on file  . Emotionally Abused: Not on file  . Physically Abused: Not on file  . Sexually Abused: Not on file    Past Medical History, Surgical history, Social history, and Family history were reviewed and updated as appropriate.   Please see review of systems for further details on the patient's review from today.   Objective:   Physical Exam:  There were no vitals taken for this visit.  Physical Exam Neurological:     Mental Status: She is alert and oriented to person, place, and time.     Cranial Nerves: No dysarthria.  Psychiatric:        Attention and Perception: Attention normal.        Mood and Affect: Mood is anxious and depressed. Affect is not angry.        Speech: Speech normal.        Behavior: Behavior is cooperative.        Thought Content: Thought content normal. Thought content is not paranoid or delusional. Thought content does not include homicidal or suicidal ideation. Thought content does not include homicidal or suicidal plan.        Cognition and Memory: Cognition and memory normal.        Judgment: Judgment normal.     Comments: She has had an increase in depression since her last  visit. Insight fair.     Lab Review:     Component Value Date/Time   NA 141 07/15/2018 0559   K 4.0 07/15/2018 0559   CL 114 (H) 07/15/2018 0559   CO2 23 07/15/2018 0559   GLUCOSE 95 07/15/2018 0559   BUN 15 07/15/2018 0559   CREATININE 1.39 (H) 07/15/2018 0559   CALCIUM 9.2 07/15/2018 0559   PROT 6.0 (L) 07/13/2018 0610   ALBUMIN 3.3 (L) 07/14/2018 0653   AST 21 07/13/2018 0610   ALT 20 07/13/2018 0610   ALKPHOS 61 07/13/2018 0610   BILITOT 0.5 07/13/2018 0610   GFRNONAA 39 (L) 07/15/2018 0559   GFRAA 45 (L) 07/15/2018 0559       Component Value Date/Time   WBC 5.5 07/14/2018 0653   RBC 3.51 (L) 07/14/2018 0653   HGB 10.4 (L) 07/14/2018 0653   HCT 34.4 (L) 07/14/2018 0653   PLT 142 (L) 07/14/2018 0653   MCV 98.0 07/14/2018 0653   MCH 29.6 07/14/2018 [id number]  MCHC 30.2 07/14/2018 0653   RDW 11.9 07/14/2018 0653   LYMPHSABS 1.6 07/14/2018 0653   MONOABS 0.4 07/14/2018 0653   EOSABS 0.2 07/14/2018 0653   BASOSABS 0.0 07/14/2018 0653    No results found for: POCLITH, LITHIUM   No results found for: PHENYTOIN, PHENOBARB, VALPROATE, CBMZ   .res Assessment: Plan:    Bipolar 1 disorder, depressed, moderate ([place]) - Plan: lamoTRIgine (LAMICTAL) 200 MG tablet, carbamazepine (TEGRETOL XR) 200 MG 12 hr tablet  Generalized anxiety disorder  Insomnia due to mental condition  Migraine without aura and without status migrainosus, not intractable   Greater than 50% of 30 min non face to face time with patient was spent on counseling and coordination of care. She is very sensitive to some mood stabilizers and is very sensitive about her weight and anything that might cause weight gain.  She also is med sensitive complicating treatment.  She is not very patient with medication trials and gives up easily which is unfortunate because we do not have a lot of options left.  She was recently hypomanic to manic prior to the last visit in November 2020.  This had resolved with  carbamazepine.  She was initially sleepy on it and complained of some itching but that has resolved.  She accidentally increased the carbamazepine XR to 400 mg nightly and is tolerating it.  We discussed hepatic conduction of metabolism of medications including the carbamazepine itself and lamotrigine specifically.  This occurs over time and is a known event and can be compensated for by dosage adjustment.  She complains of more depression over the last several weeks without sufficient explanation through external events.  We discussed how her lamotrigine level may have dropped as a result of the carbamazepine and in fact that was likely and that may explain the increase in depression.  Therefore increase lamotrigine to 200 mg twice daily from 300 mg daily. She was encouraged to continue carbamazepine XR 400 mg nightly as she is tolerating it and it can have an antianxiety effect if she keeps the dosage higher plus that is a little closer to a more typical dose  We discussed the short-term risks associated with benzodiazepines including sedation and increased fall risk among others.  Discussed long-term side effect risk including dependence, potential withdrawal symptoms, and the potential eventual dose-related risk of dementia.  Ok continue  clonazepam, since not a high dosage.  Hope to use it extra just temporarily.  Send in prescription for clonazepam 0.5 mg 3 times daily as needed anxiety or insomnia.  Use the clonazepam to help improve sleep quality quickly because the insomnia is feeling the mania.  Later we will cut back on the clonazepam.  If the increase in Equetro was helpful for anxiety then gradually reduce the clonazepam.  This appointment was 30 minutes  Follow-up 6 weeks  Lynder Parents MD, DFAPA   Future Appointments  Date Time Provider [place]  08/19/2019  3:30 PM Larey Days, MD [facility]    No orders of the defined types were placed in this encounter.      -------------------------------

## 2019-07-03 ENCOUNTER — Other Ambulatory Visit: Payer: Self-pay | Admitting: Psychiatry

## 2019-07-03 DIAGNOSIS — F3132 Bipolar disorder, current episode depressed, moderate: Secondary | ICD-10-CM

## 2019-07-30 ENCOUNTER — Ambulatory Visit: Payer: Medicare Other | Attending: Internal Medicine

## 2019-07-30 DIAGNOSIS — Z23 Encounter for immunization: Secondary | ICD-10-CM | POA: Insufficient documentation

## 2019-07-30 NOTE — Progress Notes (Signed)
   Covid-19 Vaccination Clinic  Name:  Debara Fugere Jake    MRN: [id number] DOB: Aug 07, 1950  07/30/2019  Ms. Umbaugh was observed post Covid-19 immunization for 15 minutes without incidence. She was provided with Vaccine Information Sheet and instruction to access the V-Safe system.   Ms. Pullara was instructed to call 911 with any severe reactions post vaccine: Marland Kitchen Difficulty breathing  . Swelling of your face and throat  . A fast heartbeat  . A bad rash all over your body  . Dizziness and weakness    Immunizations Administered    Name Date Dose VIS Date Route   Pfizer COVID-19 Vaccine 07/30/2019  1:53 AM 0.3 mL 05/20/2019 Intramuscular   Manufacturer: [place]   Lot: [id number]   [place]: [id number]

## 2019-08-01 ENCOUNTER — Other Ambulatory Visit: Payer: Self-pay | Admitting: Psychiatry

## 2019-08-01 DIAGNOSIS — F411 Generalized anxiety disorder: Secondary | ICD-10-CM

## 2019-08-01 NOTE — Telephone Encounter (Signed)
Last apt 01/22

## 2019-08-19 ENCOUNTER — Encounter: Payer: Medicare Other | Admitting: Obstetrics and Gynecology

## 2019-08-19 DIAGNOSIS — Z0289 Encounter for other administrative examinations: Secondary | ICD-10-CM

## 2019-08-21 ENCOUNTER — Other Ambulatory Visit: Payer: Self-pay | Admitting: Psychiatry

## 2019-08-21 DIAGNOSIS — F3132 Bipolar disorder, current episode depressed, moderate: Secondary | ICD-10-CM

## 2019-08-24 ENCOUNTER — Ambulatory Visit: Payer: Medicare Other | Attending: Internal Medicine

## 2019-08-24 DIAGNOSIS — Z23 Encounter for immunization: Secondary | ICD-10-CM

## 2019-08-24 NOTE — Progress Notes (Signed)
   Covid-19 Vaccination Clinic  Name:  Kelli Morales    MRN: [id number] DOB: 01-07-1951  08/24/2019  [name] was observed post Covid-19 immunization for 15 minutes without incident. She was provided with Vaccine Information Sheet and instruction to access the V-Safe system.   [name] was instructed to call 911 with any severe reactions post vaccine: Marland Kitchen Difficulty breathing  . Swelling of face and throat  . A fast heartbeat  . A bad rash all over body  . Dizziness and weakness   Immunizations Administered    Name Date Dose VIS Date Route   Pfizer COVID-19 Vaccine 08/24/2019 10:33 AM 0.3 mL 05/20/2019 Intramuscular   Manufacturer: [place]   Lot: [id number]   [place]: [id number]

## 2019-09-21 ENCOUNTER — Other Ambulatory Visit: Payer: Self-pay

## 2019-09-21 ENCOUNTER — Emergency Department (HOSPITAL_COMMUNITY)
Admission: EM | Admit: 2019-09-21 | Discharge: 2019-09-21 | Disposition: A | Discharge status: Home / Self Care | Payer: Medicare Other | Attending: Emergency Medicine | Admitting: Emergency Medicine

## 2019-09-21 ENCOUNTER — Emergency Department (HOSPITAL_COMMUNITY): Payer: Medicare Other

## 2019-09-21 DIAGNOSIS — I129 Hypertensive chronic kidney disease with stage 1 through stage 4 chronic kidney disease, or unspecified chronic kidney disease: Secondary | ICD-10-CM | POA: Insufficient documentation

## 2019-09-21 DIAGNOSIS — N183 Chronic kidney disease, stage 3 unspecified: Secondary | ICD-10-CM | POA: Diagnosis not present

## 2019-09-21 DIAGNOSIS — Z79899 Other long term (current) drug therapy: Secondary | ICD-10-CM | POA: Diagnosis not present

## 2019-09-21 DIAGNOSIS — R42 Dizziness and giddiness: Secondary | ICD-10-CM | POA: Diagnosis present

## 2019-09-21 DIAGNOSIS — R519 Headache, unspecified: Secondary | ICD-10-CM | POA: Insufficient documentation

## 2019-09-21 DIAGNOSIS — R55 Syncope and collapse: Secondary | ICD-10-CM

## 2019-09-21 DIAGNOSIS — Z7982 Long term (current) use of aspirin: Secondary | ICD-10-CM | POA: Diagnosis not present

## 2019-09-21 LAB — COMPREHENSIVE METABOLIC PANEL
ALT: 14 U/L (ref 0–44)
AST: 23 U/L (ref 15–41)
Albumin: 4.7 g/dL (ref 3.5–5.0)
Alkaline Phosphatase: 57 U/L (ref 38–126)
Anion gap: 11 (ref 5–15)
BUN: 23 mg/dL (ref 8–23)
CO2: 26 mmol/L (ref 22–32)
Calcium: 9.4 mg/dL (ref 8.9–10.3)
Chloride: 96 mmol/L — ABNORMAL LOW (ref 98–111)
Creatinine, Ser: 1.27 mg/dL — ABNORMAL HIGH (ref 0.44–1.00)
GFR calc Af Amer: 50 mL/min — ABNORMAL LOW (ref >60)
GFR calc non Af Amer: 43 mL/min — ABNORMAL LOW (ref >60)
Glucose, Bld: 110 mg/dL — ABNORMAL HIGH (ref 70–99)
Potassium: 3.3 mmol/L — ABNORMAL LOW (ref 3.5–5.1)
Sodium: 133 mmol/L — ABNORMAL LOW (ref 135–145)
Total Bilirubin: 0.7 mg/dL (ref 0.3–1.2)
Total Protein: 7.5 g/dL (ref 6.5–8.1)

## 2019-09-21 LAB — CBC
HCT: 38.9 % (ref 36.0–46.0)
Hemoglobin: 12.5 g/dL (ref 12.0–15.0)
MCH: 30 pg (ref 26.0–34.0)
MCHC: 32.1 g/dL (ref 30.0–36.0)
MCV: 93.3 fL (ref 80.0–100.0)
Platelets: 180 10*3/uL (ref 150–400)
RBC: 4.17 MIL/uL (ref 3.87–5.11)
RDW: 11.7 % (ref 11.5–15.5)
WBC: 5.9 10*3/uL (ref 4.0–10.5)
nRBC: 0 % (ref 0.0–0.2)

## 2019-09-21 MED ORDER — MECLIZINE HCL 25 MG PO TABS
25.0000 mg | ORAL_TABLET | Freq: Once | ORAL | Status: AC
  Administered 2019-09-21: 17:00:00 25 mg via ORAL
  Filled 2019-09-21: qty 1

## 2019-09-21 MED ORDER — SODIUM CHLORIDE 0.9 % IV BOLUS
1000.0000 mL | Freq: Once | INTRAVENOUS | Status: AC
  Administered 2019-09-21: 1000 mL via INTRAVENOUS

## 2019-09-21 MED ORDER — MECLIZINE HCL 12.5 MG PO TABS: 12.5000 mg | ORAL_TABLET | Freq: Three times a day (TID) | ORAL | 0 refills | Status: DC | PRN

## 2019-09-21 MED ORDER — ACETAMINOPHEN 500 MG PO TABS
1000.0000 mg | ORAL_TABLET | Freq: Once | ORAL | Status: AC
  Administered 2019-09-21: 17:00:00 1000 mg via ORAL
  Filled 2019-09-21: qty 2

## 2019-09-21 MED ORDER — ONDANSETRON HCL 4 MG/2ML IJ SOLN
4.0000 mg | Freq: Once | INTRAMUSCULAR | Status: AC
  Administered 2019-09-21: 4 mg via INTRAVENOUS
  Filled 2019-09-21: qty 2

## 2019-09-21 NOTE — ED Triage Notes (Signed)
Per EMS: Patient reportedly woke up this morning with vertigo like symptoms. Patient had a syncopal episode associated with dizziness and reports nausea and emesis associated with it. Patient has a reported hx of syncopal episodes.  Patient reportedly has been trying to keep fluids down to decrease the dizziness but her nausea has been increasing.

## 2019-09-21 NOTE — Discharge Instructions (Signed)
Please schedule follow-up appoint with primary doctor for recheck later this week.  Take meclizine as needed for dizziness and nausea.  Return to ER for worsening vomiting, pain, fever or other new concerning symptom.

## 2019-09-21 NOTE — ED Provider Notes (Signed)
[facility] DEPT Provider Note   CSN: [id number] Arrival date & time: 09/21/19  1521     History Chief Complaint  Patient presents with  . Near Syncope    Kelli Morales is a 69 y.o. female.  Presents to ER with new onset dizziness, near syncopal episode.  Patient reports this morning around 11 AM noted sudden onset of dizziness.  States that she has also had severe nausea, vomiting.  Nonbloody nonbilious.  Has felt very lightheaded, like she was going to pass out.  At one point fell, denies head trauma.  HPI     Past Medical History:  Diagnosis Date  . Anxiety   . Asthma   . Bipolar disorder ([place])   . Cataracts, bilateral   . Depression   . GERD (gastroesophageal reflux disease)   . Hydatid cyst    Left  . Hypertension   . Kidney problem    RT kidney is smaller -> decreased output  . Migraine   . Osteoporosis 11/2016   T score -2.8  improved from prior DEXA  . Right rotator cuff tear     Patient Active Problem List   Diagnosis Date Noted  . Vasovagal syncope 08/18/2018  . CKD (chronic kidney disease) stage 3, GFR 30-59 ml/min 08/18/2018  . Fall 07/12/2018  . 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 Right rotator cuff tear 08/26/2012  . Hypertension   . Depression   . Kidney problem   . Osteoporosis   . Asthma   . Bipolar disorder ([place])   . Migraine   . Hydatid cyst     Past Surgical History:  Procedure Laterality Date  . BUNIONECTOMY     L foot 07/2011 --RT foot 10/2011  . CATARACT EXTRACTION    . Fimbrioplasty-bilateral-Lysis of adhesions-Excision of Left ovarian cyst  1986  . Hydatid cyst     Left-Adhesions  . IR KYPHO EA ADDL LEVEL THORACIC OR LUMBAR  07/14/2018  . IR KYPHO LUMBAR INC FX REDUCE BONE BX UNI/BIL CANNULATION INC/IMAGING  07/14/2018  . IR RADIOLOGIST EVAL & MGMT  07/20/2018  . ROTATOR CUFF REPAIR     LEFT  . SHOULDER OPEN ROTATOR CUFF REPAIR Right 08/26/2012   Procedure: RIGHT SHOULDER MINI OPEN ROTATOR CUFF REPAIR POSSIBLE  PATCH GRAFT AND SUBACROMIAL DECOMPRESSION;  Surgeon: Johnn Hai, MD;  Location: [facility];  Service: Orthopedics;  Laterality: Right;     OB History    Gravida  2   Para      Term      Preterm      AB  1   Living  1     SAB      TAB      Ectopic      Multiple      Live Births              Family History  Problem Relation Age of Onset  . Diabetes Father   . Hypertension Father   . Heart disease Father   . Stroke Father   . Cancer Father        Lymphoma  . Hypertension Mother   . Heart disease Mother   . Stroke Mother   . Stroke Paternal Uncle   . Heart disease Brother     Social History   Tobacco Use  . Smoking status: Never Smoker  . Smokeless tobacco: Never Used  Substance Use Topics  . Alcohol use: Yes    Alcohol/week: 0.0 standard drinks  Comment: Rare  . Drug use: No    Home Medications Prior to Admission medications   Medication Sig Start Date End Date Taking? Authorizing Provider  ACETAMINOPHEN 8 HOUR PO Take 650 mg by mouth as needed.    [provider]  albuterol (VENTOLIN HFA) 108 (90 Base) MCG/ACT inhaler  03/17/19   [provider]  amLODipine (NORVASC) 5 MG tablet Take 5 mg by mouth daily. 10/13/18   [provider]  Ascorbic Acid (VITAMIN C PO) Take 1 tablet by mouth daily.    [provider]  aspirin 81 MG tablet Take 325 mg by mouth 3 (three) times a week. Min, Wed, Fri    [provider]  baclofen (LIORESAL) 10 MG tablet Take 10 mg by mouth 3 (three) times daily.    [provider]  CALCIUM PO Take 1 tablet by mouth daily.    [provider]  carbamazepine (TEGRETOL XR) 200 MG 12 hr tablet TAKE 2 TABLETS BY MOUTH AT  BEDTIME 07/03/19   Cottle, Billey Co., MD  cholecalciferol (VITAMIN D) 1000 UNITS tablet Take 1,000 Units by mouth 2 (two) times daily.     [provider]  clonazePAM (KLONOPIN) 0.5 MG tablet TAKE 1 TABLET (0.5 MG TOTAL) BY MOUTH 3 (THREE) TIMES  DAILY AS NEEDED FOR ANXIETY (AND INSOMNIA). 08/01/19   Cottle, Billey Co., MD  Cyanocobalamin (VITAMIN B 12 PO) Take by mouth.    [provider]  denosumab (PROLIA) 60 MG/ML SOLN injection Inject 60 mg into the skin every 6 (six) months. Administer in upper arm, thigh, or abdomen    [provider]  fish oil-omega-3 fatty acids 1000 MG capsule Take 1 g by mouth daily.    [provider]  fluticasone (FLONASE) 50 MCG/ACT nasal spray Place 2 sprays into the nose daily.    [provider]  Fluticasone Furoate (ARNUITY ELLIPTA IN) Inhale 1 puff into the lungs at bedtime.     [provider]  Glycerin-Hypromellose-PEG 400 ([employer] DRY EYE RELIEF OP) Apply 1 drop to eye at bedtime as needed (dry eyes).    [provider]  guaiFENesin (MUCINEX PO) Take 1 tablet by mouth as needed.     [provider]  IRON PO Take 1 tablet by mouth daily.    [provider]  L-FORMULA LYSINE HCL PO Take by mouth.    [provider]  lamoTRIgine (LAMICTAL) 200 MG tablet TAKE 1 TABLET BY MOUTH  TWICE DAILY 08/22/19   Cottle, Billey Co., MD  magnesium gluconate (MAGONATE) 500 MG tablet Take 500 mg by mouth daily.    [provider]  meclizine (ANTIVERT) 12.5 MG tablet Take 1 tablet (12.5 mg total) by mouth 3 (three) times daily as needed for dizziness. 09/21/19   Lucrezia Starch, MD  Multiple Vitamins-Minerals (COMPLETE MULTIVITAMIN/MINERAL PO) Take 1 tablet by mouth daily.    [provider]  pantoprazole (PROTONIX) 40 MG tablet Take 40 mg by mouth daily.    [provider]  phenylephrine (SUDAFED PE) 10 MG TABS tablet Take 10 mg by mouth every 4 (four) hours as needed.     [provider]  polyethylene glycol (MIRALAX / GLYCOLAX) packet Take 17 g by mouth 3 (three) times a week.    [provider]  Potassium 99 MG TABS Take by mouth.    [provider]  promethazine (PHENERGAN) 25 MG tablet  Take 25 mg by mouth every 6 (six)  hours as needed. Nausea    [provider]  topiramate (TOPAMAX) 200 MG tablet Take 200 mg by mouth daily.     [provider]  traMADol (ULTRAM) 50 MG tablet Take 1-2 tablets by mouth as needed. 08/02/18   [provider]  traZODone (DESYREL) 100 MG tablet TAKE 3 TABLETS BY MOUTH AT  BEDTIME 01/31/19   Cottle, Billey Co., MD  zonisamide (ZONEGRAN) 100 MG capsule Take 300 mg by mouth daily.     [provider]    Allergies    Dilaudid [hydromorphone hcl], Divalproex sodium, Geodon [ziprasidone hydrochloride], Nsaids, Perphenazine-amitriptyline, and Seroquel [quetiapine fumerate]  Review of Systems   Review of Systems  Constitutional: Negative for chills and fever.  HENT: Negative for ear pain and sore throat.   Eyes: Negative for pain and visual disturbance.  Respiratory: Negative for cough and shortness of breath.   Cardiovascular: Negative for chest pain and palpitations.  Gastrointestinal: Negative for abdominal pain and vomiting.  Genitourinary: Negative for dysuria and hematuria.  Musculoskeletal: Negative for arthralgias and back pain.  Skin: Negative for color change and rash.  Neurological: Positive for dizziness and headaches. Negative for seizures and syncope.  All other systems reviewed and are negative.   Physical Exam Updated Vital Signs BP 126/85 (BP Location: Left Arm)   Pulse 81   Temp 97.8 F (36.6 C) (Oral)   Resp 15   SpO2 97%   Physical Exam Vitals and nursing note reviewed.  Constitutional:      General: She is not in acute distress.    Appearance: She is well-developed.  HENT:     Head: Normocephalic and atraumatic.  Eyes:     Conjunctiva/sclera: Conjunctivae normal.  Cardiovascular:     Rate and Rhythm: Normal rate and regular rhythm.     Heart sounds: No murmur.  Pulmonary:     Effort: Pulmonary effort is normal. No respiratory distress.     Breath sounds: Normal breath sounds.   Abdominal:     Palpations: Abdomen is soft.     Tenderness: There is no abdominal tenderness.  Musculoskeletal:        General: No deformity or signs of injury.     Cervical back: Neck supple.  Skin:    General: Skin is warm and dry.     Capillary Refill: Capillary refill takes less than 2 seconds.  Neurological:     Mental Status: She is alert.     Comments: AAOx3 CN 2-12 intact, noted leftward gaze fatiguable nystagmus, no vertical nystagmus, speech clear visual fields intact 5/5 strength in b/l UE and LE Sensation to light touch intact in b/l UE and LE Normal FNF Normal gait  Psychiatric:        Mood and Affect: Mood normal.        Behavior: Behavior normal.     ED Results / Procedures / Treatments   Labs (all labs ordered are listed, but only abnormal results are displayed) Labs Reviewed  COMPREHENSIVE METABOLIC PANEL - Abnormal; Notable for the following components:      Result Value   Sodium 133 (*)    Potassium 3.3 (*)    Chloride 96 (*)    Glucose, Bld 110 (*)    Creatinine, Ser 1.27 (*)    GFR calc non Af Amer 43 (*)    GFR calc Af Amer 50 (*)    All other components within normal limits  CBC    EKG EKG Interpretation  Date/Time:  Wednesday September 21 2019 17:06:50 EDT Ventricular Rate:  60 PR Interval:  228 QRS Duration: 100 QT Interval:  450 QTC Calculation: 450 R Axis:   76 Text Interpretation: Sinus rhythm with 1st degree A-V block Incomplete right bundle branch block Borderline ECG Confirmed by Madalyn Rob [phone]) on 09/21/2019 6:56:06 PM   Radiology DG Chest 2 View  Result Date: 09/21/2019 CLINICAL DATA:  Woke up this morning with vertical like symptoms. Syncopal episode. EXAM: CHEST - 2 VIEW COMPARISON:  08/26/2012, MRI spine 08/25/2018 FINDINGS: Lungs are adequately inflated without consolidation or effusion. Cardiomediastinal silhouette is within normal. Surgical anchors over the right humeral head. Degenerative change of the spine.  Evidence of previous kyphoplasty of T12 and L1. IMPRESSION: No active cardiopulmonary disease. Electronically Signed   By: Marin Olp M.D.   On: 09/21/2019 16:07   MR BRAIN WO CONTRAST  Result Date: 09/21/2019 CLINICAL DATA:  Sudden onset vertigo, concern for central etiology, stroke; vertigo, central. EXAM: MRI HEAD WITHOUT CONTRAST TECHNIQUE: Multiplanar, multiecho pulse sequences of the brain and surrounding structures were obtained without intravenous contrast. COMPARISON:  Brain MRI 07/12/2018, head CT 09/01/2006 FINDINGS: Brain: Mild intermittent motion degradation, most notably of the coronal T2 weighted sequence. There is no evidence of acute infarct. No evidence of intracranial mass. No midline shift or extra-axial fluid collection. No chronic intracranial blood products. Minimal scattered T2 hyperintensity within the cerebral white matter is nonspecific, but consistent with chronic small vessel ischemic disease Cerebral volume is normal for age. Vascular: Flow voids maintained within the proximal large arterial vessels. Skull and upper cervical spine: No focal marrow lesion. Sinuses/Orbits: Visualized orbits demonstrate no acute abnormality. Mild ethmoid sinus mucosal thickening. Small right mastoid effusion. IMPRESSION: Mildly motion degraded exam. No evidence of acute intracranial abnormality, including acute infarction. Minimal chronic small vessel ischemic changes within the cerebral white matter. Mild ethmoid sinus mucosal thickening. Small right mastoid effusion. Electronically Signed   By: Kellie Simmering DO   On: 09/21/2019 18:32    Procedures Procedures (including critical care time)  Medications Ordered in ED Medications  ondansetron (ZOFRAN) injection 4 mg (4 mg Intravenous Given 09/21/19 1621)  sodium chloride 0.9 % bolus 1,000 mL (0 mLs Intravenous Stopped 09/21/19 1823)  acetaminophen (TYLENOL) tablet 1,000 mg (1,000 mg Oral Given 09/21/19 1719)  meclizine (ANTIVERT) tablet 25 mg  (25 mg Oral Given 09/21/19 1719)    ED Course  I have reviewed the triage vital signs and the nursing notes.  Pertinent labs & imaging results that were available during my care of the patient were reviewed by me and considered in my medical decision making (see chart for details).  Clinical Course as of Sep 20 1899  Wed Sep 21, 2019  1855 Reassessed, symptoms have completely resolved, reviewed MRI results, labs with patient, will discharge home   [RD]    Clinical Course User Index [RD] Lucrezia Starch, MD   MDM Rules/Calculators/A&P                      69 year old lady who presented to ER with complaints of dizziness, lightheadedness, near syncope.  On exam patient had a normal neurologic exam, did note some lateral nystagmus.  Given age, sudden onset, reported difficulty with walking and possible fall, concerned about possible central etiology, stroke.  MRI brain was obtained and this was negative.  EKG without ischemic changes, electrolytes grossly normal except for mild hypokalemia.  Suspect most likely vasovagal or BPPV.  Symptoms resolved after  receiving meclizine, Zofran and Tylenol.  Remained well-appearing while observed in ER, will discharge home with plan to follow-up with PCP.    After the discussed management above, the patient was determined to be safe for discharge.  The patient was in agreement with this plan and all questions regarding their care were answered.  ED return precautions were discussed and the patient will return to the ED with any significant worsening of condition.   Final Clinical Impression(s) / ED Diagnoses Final diagnoses:  Near syncope  Vertigo    Rx / DC Orders ED Discharge Orders         Ordered    meclizine (ANTIVERT) 12.5 MG tablet  3 times daily PRN     09/21/19 1850           Lucrezia Starch, MD 09/21/19 1901

## 2019-09-22 ENCOUNTER — Other Ambulatory Visit: Payer: Self-pay

## 2019-09-23 ENCOUNTER — Encounter: Payer: Self-pay | Admitting: Obstetrics and Gynecology

## 2019-09-23 ENCOUNTER — Ambulatory Visit (INDEPENDENT_AMBULATORY_CARE_PROVIDER_SITE_OTHER): Payer: Medicare Other | Admitting: Obstetrics and Gynecology

## 2019-09-23 VITALS — BP 116/70 | Ht 65.0 in | Wt 145.0 lb

## 2019-09-23 DIAGNOSIS — Z01419 Encounter for gynecological examination (general) (routine) without abnormal findings: Secondary | ICD-10-CM

## 2019-09-23 DIAGNOSIS — M810 Age-related osteoporosis without current pathological fracture: Secondary | ICD-10-CM

## 2019-09-23 NOTE — Progress Notes (Signed)
[place] [birth year] [id number]  SUBJECTIVE:  69 y.o. G2P0011 female for annual routine gynecologic exam. She has no gynecologic concerns. Her mother just passed away this morning.  Current Outpatient Medications  Medication Sig Dispense Refill  . ACETAMINOPHEN 8 HOUR PO Take 650 mg by mouth as needed.    Marland Kitchen albuterol (VENTOLIN HFA) 108 (90 Base) MCG/ACT inhaler     . amLODipine (NORVASC) 5 MG tablet Take 5 mg by mouth daily.    . Ascorbic Acid (VITAMIN C PO) Take 1 tablet by mouth daily.    Marland Kitchen aspirin 81 MG tablet Take 325 mg by mouth 3 (three) times a week. Min, Wed, Fri    . baclofen (LIORESAL) 10 MG tablet Take 10 mg by mouth 3 (three) times daily.    Marland Kitchen CALCIUM PO Take 1 tablet by mouth daily.    . carbamazepine (TEGRETOL XR) 200 MG 12 hr tablet TAKE 2 TABLETS BY MOUTH AT  BEDTIME 180 tablet 3  . cholecalciferol (VITAMIN D) 1000 UNITS tablet Take 1,000 Units by mouth 2 (two) times daily.     . clonazePAM (KLONOPIN) 0.5 MG tablet TAKE 1 TABLET (0.5 MG TOTAL) BY MOUTH 3 (THREE) TIMES DAILY AS NEEDED FOR ANXIETY (AND INSOMNIA). 270 tablet 0  . Cyanocobalamin (VITAMIN B 12 PO) Take by mouth.    . denosumab (PROLIA) 60 MG/ML SOLN injection Inject 60 mg into the skin every 6 (six) months. Administer in upper arm, thigh, or abdomen    . fluticasone (FLONASE) 50 MCG/ACT nasal spray Place 2 sprays into the nose daily.    . Fluticasone Furoate (ARNUITY ELLIPTA IN) Inhale 1 puff into the lungs at bedtime.     . Glycerin-Hypromellose-PEG 400 ([employer] DRY EYE RELIEF OP) Apply 1 drop to eye at bedtime as needed (dry eyes).    Marland Kitchen guaiFENesin (MUCINEX PO) Take 1 tablet by mouth as needed.     . IRON PO Take 1 tablet by mouth daily.    Marland Kitchen lamoTRIgine (LAMICTAL) 200 MG tablet TAKE 1 TABLET BY MOUTH  TWICE DAILY 180 tablet 3  . magnesium gluconate (MAGONATE) 500 MG tablet Take 500 mg by mouth daily.    . meclizine (ANTIVERT) 12.5 MG tablet Take 1 tablet (12.5 mg total) by mouth 3 (three) times daily as  needed for dizziness. 30 tablet 0  . Multiple Vitamins-Minerals (COMPLETE MULTIVITAMIN/MINERAL PO) Take 1 tablet by mouth daily.    . pantoprazole (PROTONIX) 40 MG tablet Take 40 mg by mouth daily.    . phenylephrine (SUDAFED PE) 10 MG TABS tablet Take 10 mg by mouth every 4 (four) hours as needed.     . polyethylene glycol (MIRALAX / GLYCOLAX) packet Take 17 g by mouth 3 (three) times a week.    . Potassium 99 MG TABS Take by mouth.    . promethazine (PHENERGAN) 25 MG tablet Take 25 mg by mouth every 6 (six) hours as needed. Nausea    . topiramate (TOPAMAX) 200 MG tablet Take 200 mg by mouth daily.     . traMADol (ULTRAM) 50 MG tablet Take 1-2 tablets by mouth as needed.    . traZODone (DESYREL) 100 MG tablet TAKE 3 TABLETS BY MOUTH AT  BEDTIME 270 tablet 3  . zonisamide (ZONEGRAN) 100 MG capsule Take 300 mg by mouth daily.     . fish oil-omega-3 fatty acids 1000 MG capsule Take 1 g by mouth daily.    . L-FORMULA LYSINE HCL PO Take by mouth.  No current facility-administered medications for this visit.   Allergies: Dilaudid [hydromorphone hcl], Divalproex sodium, Geodon [ziprasidone hydrochloride], Nsaids, Perphenazine-amitriptyline, and Seroquel [quetiapine fumerate]  No LMP recorded. Patient is postmenopausal.  Past medical history,surgical history, problem list, medications, allergies, family history and social history were all reviewed and documented as reviewed in the EPIC chart.  ROS:  Feeling well. No dyspnea or chest pain on exertion.  No abdominal pain, change in bowel habits, black or bloody stools.  No urinary tract symptoms. GYN ROS: no abnormal bleeding, pelvic pain or discharge, no breast pain or new or enlarging lumps on self exam. No neurological complaints.  OBJECTIVE:  BP 116/70   Ht 5\' 5"  (1.651 m)   Wt 145 lb (65.8 kg)   BMI 24.13 kg/m  The patient appears well, alert, oriented x 3, in no distress. ENT normal.  Neck supple. No cervical or supraclavicular  adenopathy or thyromegaly.  Lungs are clear, good air entry, no wheezes, rhonchi or rales. S1 and S2 normal, no murmurs, regular rate and rhythm.  Abdomen soft without tenderness, guarding, mass or organomegaly.  Neurological is normal, no focal findings.  BREAST EXAM: breasts appear normal, no suspicious masses, no skin or nipple changes or axillary nodes  PELVIC EXAM: VULVA: normal appearing vulva with no masses, tenderness or lesions, atrophic changes, VAGINA: normal appearing vagina with normal color and discharge, no lesions, CERVIX: normal appearing cervix without discharge or lesions, UTERUS: uterus is normal size, shape, consistency and nontender, ADNEXA: normal adnexa in size, nontender and no masses  Chaperone: [name] present during the examination  ASSESSMENT:  69 y.o. G2P0011 here for annual gynecologic exam  PLAN:    1. Postmenopausal.  No significant hot flashes or night sweats.  No vaginal bleeding. 2. Pap smear 04/2018 was normal.  No significant history of abnormal Pap smears.  Next Pap smear due 2022 following the current guidelines recommending the 3 year interval if she should so desire screening, will readdress on annual basis. 3. Mammogram 07/2018.  Normal breast exam today.  She is reminded to schedule an annual mammogram this year. 4. Colonoscopy 2015.  Recommended that she follow up at the recommended interval.   5.  Osteoporosis.  DEXA 2020.  We do not have a copy of the report, but she says bone density was stable.  Managed by her primary care doctor, Dr. Dema Severin.  6. Health maintenance.  No labs today as she normally has these completed with her primary care doctor.  Return annually or sooner, prn.  Joseph Pierini MD, FACOG  09/23/19

## 2019-10-31 ENCOUNTER — Other Ambulatory Visit: Payer: Self-pay | Admitting: Psychiatry

## 2019-10-31 DIAGNOSIS — F411 Generalized anxiety disorder: Secondary | ICD-10-CM

## 2019-11-01 NOTE — Telephone Encounter (Signed)
Last apt 06/2019 was due back 6 weeks

## 2019-11-01 NOTE — Telephone Encounter (Signed)
Call to RS

## 2019-11-02 NOTE — Telephone Encounter (Signed)
I got her rs she is not doing well and said she would die before her next appt 6/24. I have put her on the cancelation list if we get anything sooner. She said she is very hyper.

## 2019-11-03 ENCOUNTER — Telehealth: Payer: Medicare Other | Admitting: Psychiatry

## 2019-11-04 NOTE — Telephone Encounter (Signed)
Also her Lamictal is 200 mg bid she said.

## 2019-11-04 NOTE — Telephone Encounter (Signed)
Contacted patient today didn't realize she had a message attached to her refill request. Patient said she's so manic, can't get her mind to slow down. Not sleeping well. Taking carbamazepine XR 200 mg at hs instead of 400 mg due to causing headaches. Asking if maybe she could try geodon again to see if it would help with her mania?    Uses [facility]

## 2019-11-05 NOTE — Telephone Encounter (Signed)
OK start Geodon 20 mg in PM with food for 1 week, then 20 mg BID.  She's very med sensitive.

## 2019-11-08 ENCOUNTER — Other Ambulatory Visit: Payer: Self-pay

## 2019-11-08 MED ORDER — ZIPRASIDONE HCL 20 MG PO CAPS: ORAL_CAPSULE | ORAL | 0 refills | Status: DC

## 2019-11-08 NOTE — Telephone Encounter (Signed)
Patient aware of adding Geogon 20 mg given instructions. Rx sent to [facility].   Patient is able to sleep some but wakes up every morning at 3 am.

## 2019-11-16 ENCOUNTER — Other Ambulatory Visit: Payer: Self-pay | Admitting: Psychiatry

## 2019-11-21 ENCOUNTER — Other Ambulatory Visit: Payer: Self-pay

## 2019-11-23 ENCOUNTER — Ambulatory Visit: Payer: Medicare Other | Admitting: Obstetrics and Gynecology

## 2019-11-23 ENCOUNTER — Encounter: Payer: Self-pay | Admitting: Obstetrics and Gynecology

## 2019-11-23 ENCOUNTER — Other Ambulatory Visit: Payer: Self-pay

## 2019-11-23 VITALS — BP 118/76

## 2019-11-23 DIAGNOSIS — N952 Postmenopausal atrophic vaginitis: Secondary | ICD-10-CM

## 2019-11-23 DIAGNOSIS — N9489 Other specified conditions associated with female genital organs and menstrual cycle: Secondary | ICD-10-CM | POA: Diagnosis not present

## 2019-11-23 DIAGNOSIS — R102 Pelvic and perineal pain unspecified side: Secondary | ICD-10-CM

## 2019-11-23 DIAGNOSIS — N941 Unspecified dyspareunia: Secondary | ICD-10-CM | POA: Diagnosis not present

## 2019-11-23 MED ORDER — ESTRADIOL 0.1 MG/GM VA CREA: 1.0000 | TOPICAL_CREAM | Freq: Every day | VAGINAL | 12 refills | Status: DC

## 2019-11-23 NOTE — Patient Instructions (Signed)
Apply Estrace 0.1 mg/g vaginal cream to nightly for 3 weeks using the vaginal applicator, and you can decrease use to twice weekly administration If Estrace cream is very expensive to fill, please let us know and we can try instead sending a prescription for a custom compounded estrogen cream formulation

## 2019-11-23 NOTE — Progress Notes (Signed)
[place] [birth year] [id number]  SUBJECTIVE:  69 y.o. G2P0011 female presents for vaginal dryness and painful intercourse. She had not been sexually active for 18 years but has had development of a relationship recently with a partner and they had a sexual encounter and she had a lot of discomfort with initial penetration throughout the course, despite using lubricants.  She had slight bleeding after intercourse but no continued bleeding and none currently.  Current Outpatient Medications  Medication Sig Dispense Refill  . ACETAMINOPHEN 8 HOUR PO Take 650 mg by mouth as needed.    Marland Kitchen albuterol (VENTOLIN HFA) 108 (90 Base) MCG/ACT inhaler     . amLODipine (NORVASC) 5 MG tablet Take 5 mg by mouth daily.    . Ascorbic Acid (VITAMIN C PO) Take 1 tablet by mouth daily.    Marland Kitchen aspirin 81 MG tablet Take 325 mg by mouth 3 (three) times a week. Min, Wed, Fri    . baclofen (LIORESAL) 10 MG tablet Take 10 mg by mouth 3 (three) times daily.    Marland Kitchen CALCIUM PO Take 1 tablet by mouth daily.    . carbamazepine (TEGRETOL XR) 200 MG 12 hr tablet TAKE 2 TABLETS BY MOUTH AT  BEDTIME 180 tablet 3  . cholecalciferol (VITAMIN D) 1000 UNITS tablet Take 1,000 Units by mouth 2 (two) times daily.     . clonazePAM (KLONOPIN) 0.5 MG tablet TAKE 1 TABLET (0.5 MG TOTAL) BY MOUTH 3 (THREE) TIMES DAILY AS NEEDED FOR ANXIETY (AND INSOMNIA). 270 tablet 0  . Cyanocobalamin (VITAMIN B 12 PO) Take by mouth.    . denosumab (PROLIA) 60 MG/ML SOLN injection Inject 60 mg into the skin every 6 (six) months. Administer in upper arm, thigh, or abdomen    . fish oil-omega-3 fatty acids 1000 MG capsule Take 1 g by mouth daily.    . fluticasone (FLONASE) 50 MCG/ACT nasal spray Place 2 sprays into the nose daily.    . Fluticasone Furoate (ARNUITY ELLIPTA IN) Inhale 1 puff into the lungs at bedtime.     . Glycerin-Hypromellose-PEG 400 ([facility] DRY EYE RELIEF OP) Apply 1 drop to eye at bedtime as needed (dry eyes).    Marland Kitchen guaiFENesin (MUCINEX  PO) Take 1 tablet by mouth as needed.     . IRON PO Take 1 tablet by mouth daily.    . L-FORMULA LYSINE HCL PO Take by mouth.    . lamoTRIgine (LAMICTAL) 200 MG tablet TAKE 1 TABLET BY MOUTH  TWICE DAILY 180 tablet 3  . magnesium gluconate (MAGONATE) 500 MG tablet Take 500 mg by mouth daily.    . meclizine (ANTIVERT) 12.5 MG tablet Take 1 tablet (12.5 mg total) by mouth 3 (three) times daily as needed for dizziness. 30 tablet 0  . Multiple Vitamins-Minerals (COMPLETE MULTIVITAMIN/MINERAL PO) Take 1 tablet by mouth daily.    . pantoprazole (PROTONIX) 40 MG tablet Take 40 mg by mouth daily.    . phenylephrine (SUDAFED PE) 10 MG TABS tablet Take 10 mg by mouth every 4 (four) hours as needed.     . polyethylene glycol (MIRALAX / GLYCOLAX) packet Take 17 g by mouth 3 (three) times a week.    . Potassium 99 MG TABS Take by mouth.    . promethazine (PHENERGAN) 25 MG tablet Take 25 mg by mouth every 6 (six) hours as needed. Nausea    . topiramate (TOPAMAX) 200 MG tablet Take 200 mg by mouth daily.     . traMADol Veatrice Bourbon)  50 MG tablet Take 1-2 tablets by mouth as needed.    . traZODone (DESYREL) 100 MG tablet TAKE 3 TABLETS BY MOUTH AT  BEDTIME 270 tablet 3  . ziprasidone (GEODON) 20 MG capsule Take 1 capsule (20 mg) every pm with evening meal for 7 days, then increase to 1 capsule bid with meals. 60 capsule 0  . zonisamide (ZONEGRAN) 100 MG capsule Take 300 mg by mouth daily.      No current facility-administered medications for this visit.   Allergies: Dilaudid [hydromorphone hcl], Divalproex sodium, Nsaids, Perphenazine-amitriptyline, and Seroquel [quetiapine fumerate]  No LMP recorded. Patient is postmenopausal.  Past medical history,surgical history, problem list, medications, allergies, family history and social history were all reviewed and documented as reviewed in the EPIC chart.  OBJECTIVE:  BP 118/76  The patient appears well, alert, oriented x 3, in no distress. PELVIC EXAM: VULVA:  normal appearing vulva with no masses, tenderness or lesions, no bleeding, atrophic changes, VAGINA: normal atrophic appearing vagina with normal color and discharge, no lesions, CERVIX: normal appearing cervix without discharge or lesions  Chaperone: [name] present during the examination  ASSESSMENT:  69 y.o. G2P1011 here for dyspareunia due to vulvovaginal atrophy  PLAN:  We discussed common symptoms of dyspareunia with postmenopausal vaginal atrophy and that if regular use of lubricants is not solving the problem, consideration should be given to vaginal estrogen therapy.  We discussed the risks and benefits of local vaginal estrogen cream therapy.  Theoretical risks of systemic absorption and associated risks of thrombotic disease to include heart attack, stroke, DVT, PE, breast cancer issue and uterine cancer.  Overall, using vaginal estrogen cream as prescribed providing a low-dose of hormone should be relatively safe.  After our discussion today she wanted to try the estrogen cream so I gave her a prescription for Estrace 0.1 mg/g vaginal cream to apply nightly for 3 weeks and then she can decrease use to twice weekly administration.  She knows to call if there are any signs of abnormal bleeding or other concerning symptoms.   Joseph Pierini MD 11/23/19

## 2019-12-01 ENCOUNTER — Other Ambulatory Visit: Payer: Self-pay

## 2019-12-01 ENCOUNTER — Encounter: Payer: Self-pay | Admitting: Psychiatry

## 2019-12-01 ENCOUNTER — Ambulatory Visit (INDEPENDENT_AMBULATORY_CARE_PROVIDER_SITE_OTHER): Payer: Medicare Other | Admitting: Psychiatry

## 2019-12-01 DIAGNOSIS — F5105 Insomnia due to other mental disorder: Secondary | ICD-10-CM | POA: Diagnosis not present

## 2019-12-01 DIAGNOSIS — G43009 Migraine without aura, not intractable, without status migrainosus: Secondary | ICD-10-CM | POA: Diagnosis not present

## 2019-12-01 DIAGNOSIS — F411 Generalized anxiety disorder: Secondary | ICD-10-CM | POA: Diagnosis not present

## 2019-12-01 DIAGNOSIS — F3181 Bipolar II disorder: Secondary | ICD-10-CM | POA: Diagnosis not present

## 2019-12-01 MED ORDER — TOPIRAMATE 200 MG PO TABS: 200.0000 mg | ORAL_TABLET | Freq: Every day | ORAL | 1 refills | Status: DC

## 2019-12-01 MED ORDER — CLONAZEPAM 0.5 MG PO TABS: 0.5000 mg | ORAL_TABLET | Freq: Three times a day (TID) | ORAL | 0 refills | Status: DC | PRN

## 2019-12-01 MED ORDER — ZIPRASIDONE HCL 20 MG PO CAPS: 20.0000 mg | ORAL_CAPSULE | Freq: Two times a day (BID) | ORAL | 1 refills | Status: DC

## 2019-12-01 MED ORDER — LAMOTRIGINE 200 MG PO TABS: 400.0000 mg | ORAL_TABLET | Freq: Two times a day (BID) | ORAL | 1 refills | Status: DC

## 2019-12-01 NOTE — Progress Notes (Signed)
Kelli Morales [id number] 1950-11-26 69 y.o.    Subjective:   Patient ID:  Kelli Morales is a 69 y.o. (DOB 26-Oct-1950) female.  Chief Complaint:  Chief Complaint  Patient presents with  . Follow-up  . Manic Behavior  . Depression  . Anxiety    Depression        Associated symptoms include no decreased concentration, no headaches and no suicidal ideas.  Past medical history includes anxiety.   Anxiety Patient reports no confusion, decreased concentration, dizziness, nervous/anxious behavior or suicidal ideas.      Kelli Morales presents to the office today for follow-up of bipolar disorder an insomnia chronically.   visit September 28, 2018.  She was started on Equetro for mood stabilization.  She called back the next day stating it would be $400 for 90-day supply and she wanted to try the generic which we did.  She was somewhat reluctant to take it at all after reading about potential side effects.  She was given reassurance about what is more actually more typical rather than what is rare and she committed to trying it.  However 5 days later she called wanting to change mood stabilizers again.  She complained of having brain fog and sleepiness.  Noticed it at 200 but really a problem at 300 with excessive sleepiness in the daytime.  Sleep fine at 100 mg  The last few days.  Better function.  Better schedule recently helped sleep and mood.  Mood is much better lately.  Not sig manic now.  Not sure how the mania occurred unless Covid triggered.   Anxiety is periodic and related to situations.  Better working with anxiety.  Staying alone causes some anxiety.    seen May 2020.  She called in August wanting to increase clonazepam 0.5 mg 3 times daily to a higher dose because of conflict with her daughter.  She was told not to increase the dosage because she was already and chronically complaining of cognitive problems.  July 01, 2019 appointment with the following noted: She called back  in October indicated she had increased the carbamazepine to 300 mg nightly with some itching but no rash.  She was told she could reduce it to 200 mg nightly and start Zyrtec but to contact us if she had more significant rash.  During the fall 2020 she change carbamazepine to 200 mg nightly. Not good.  Worse depression without relief for 6 weeks.  Got a new job and thought it would help but it hasn't.  Part of stress M Covid positive and worry over it but reportedly no sx.    Still some problems with her Problems with concentration and memory and get lost driving at times but only new places now.  Got lost driving to new place last time.  Moved lately.    And M had surgery.  Chronic pain after fall in Feb. Plan:Therefore increase lamotrigine to 200 mg twice daily from 300 mg daily. She was encouraged to continue carbamazepine XR 400 mg nightly as she is tolerating it and it can have an antianxiety effect if she keeps the dosage higher plus that is a little closer to a more typical dose Recommend follow-up in 6 weeks  12/01/2019 appointment with the following noted: Patient did not follow-up within recommended suggested time. Stopped CBZ bc weight gain. Started Geodon in it's place in April.  It's doing quite well.  No where near as manic.  When calmer depression is not  there. Terrible year, M got Covid and pneumonia and 12-Oct-2022 died.  Pt has to manage things.  When she died I lost it and more manic than ever in her life with spending excessively.   Off and on some manic sx but Geodon really helping.  Tolerating it well without N or restlessness.  Taking it with food. Sleep is much better now.  Was bad until Geodon with mixed sx from Jan to 12-Oct-2022. Contract ended and new contract now so working.  Work is good for her. Anxiety is a lot better.   Has a BF and it helps. . Pt reports that mood is anxious and hyper and describes anxiety as Moderate. Anxiety symptoms include: Excessive Worry,. Pt reports some  sleep issues. Pt reports that appetite is good. Pt reports that energy is good and good. Concentration is down slightly. Suicidal thoughts:  denied by patient.  Past Psychiatric Medication Trials: Vraylar early weight gain.  Perphenazine 8 mg, quetiapine 300 mg, Abilify 10, lithium, Geodon, Depakote, CBZ remotely but this is uncertain,  Risperidone, latuda 5# weight gain, CBZ 300 sleepiness. Lamotrigine 300  Mirtazapine, paroxetine, Lexapro, sertraline, duloxetine 120,   Review of Systems:  Review of Systems  Musculoskeletal: Positive for back pain. Negative for arthralgias.  Neurological: Negative for dizziness, tremors, weakness and headaches.  Psychiatric/Behavioral: Positive for depression. Negative for agitation, behavioral problems, confusion, decreased concentration, dysphoric mood, hallucinations, self-injury, sleep disturbance and suicidal ideas. The patient is not nervous/anxious and is not hyperactive.   several episodes passing out from poor hydration and eating.  Medications: I have reviewed the patient's current medications.  Current Outpatient Medications  Medication Sig Dispense Refill  . ACETAMINOPHEN 8 HOUR PO Take 650 mg by mouth as needed.    Marland Kitchen albuterol (VENTOLIN HFA) 108 (90 Base) MCG/ACT inhaler     . amLODipine (NORVASC) 5 MG tablet Take 5 mg by mouth daily.    . Ascorbic Acid (VITAMIN C PO) Take 1 tablet by mouth daily.    Marland Kitchen aspirin 81 MG tablet Take 325 mg by mouth 3 (three) times a week. Min, Wed, Fri    . baclofen (LIORESAL) 10 MG tablet Take 10 mg by mouth 3 (three) times daily.    Marland Kitchen CALCIUM PO Take 1 tablet by mouth daily.    . cholecalciferol (VITAMIN D) 1000 UNITS tablet Take 1,000 Units by mouth 2 (two) times daily.     . clonazePAM (KLONOPIN) 0.5 MG tablet TAKE 1 TABLET (0.5 MG TOTAL) BY MOUTH 3 (THREE) TIMES DAILY AS NEEDED FOR ANXIETY (AND INSOMNIA). 270 tablet 0  . Cyanocobalamin (VITAMIN B 12 PO) Take by mouth.    . denosumab (PROLIA) 60 MG/ML SOLN  injection Inject 60 mg into the skin every 6 (six) months. Administer in upper arm, thigh, or abdomen    . estradiol (ESTRACE VAGINAL) 0.1 MG/GM vaginal cream Place 1 Applicatorful vaginally at bedtime. Nightly for 3 weeks. Then decrease to 2 nights per week. 42.5 g 12  . fish oil-omega-3 fatty acids 1000 MG capsule Take 1 g by mouth daily.    . fluticasone (FLONASE) 50 MCG/ACT nasal spray Place 2 sprays into the nose daily.    . Fluticasone Furoate (ARNUITY ELLIPTA IN) Inhale 1 puff into the lungs at bedtime.     . Glycerin-Hypromellose-PEG 400 ([facility] DRY EYE RELIEF OP) Apply 1 drop to eye at bedtime as needed (dry eyes).    Marland Kitchen guaiFENesin (MUCINEX PO) Take 1 tablet by mouth as needed.     Marland Kitchen  IRON PO Take 1 tablet by mouth daily.    . L-FORMULA LYSINE HCL PO Take by mouth.    . lamoTRIgine (LAMICTAL) 200 MG tablet TAKE 1 TABLET BY MOUTH  TWICE DAILY (Patient taking differently: Take 400 mg by mouth 2 (two) times daily. ) 180 tablet 3  . magnesium gluconate (MAGONATE) 500 MG tablet Take 500 mg by mouth daily.    . meclizine (ANTIVERT) 12.5 MG tablet Take 1 tablet (12.5 mg total) by mouth 3 (three) times daily as needed for dizziness. 30 tablet 0  . Multiple Vitamins-Minerals (COMPLETE MULTIVITAMIN/MINERAL PO) Take 1 tablet by mouth daily.    . pantoprazole (PROTONIX) 40 MG tablet Take 40 mg by mouth daily.    . phenylephrine (SUDAFED PE) 10 MG TABS tablet Take 10 mg by mouth every 4 (four) hours as needed.     . polyethylene glycol (MIRALAX / GLYCOLAX) packet Take 17 g by mouth 3 (three) times a week.    . Potassium 99 MG TABS Take by mouth.    . promethazine (PHENERGAN) 25 MG tablet Take 25 mg by mouth every 6 (six) hours as needed. Nausea    . topiramate (TOPAMAX) 200 MG tablet Take 200 mg by mouth daily.     . traMADol (ULTRAM) 50 MG tablet Take 1-2 tablets by mouth as needed.    . traZODone (DESYREL) 100 MG tablet TAKE 3 TABLETS BY MOUTH AT  BEDTIME 270 tablet 3  . ziprasidone (GEODON) 20 MG  capsule Take 1 capsule (20 mg) every pm with evening meal for 7 days, then increase to 1 capsule bid with meals. 60 capsule 0  . zonisamide (ZONEGRAN) 100 MG capsule Take 300 mg by mouth daily.     . carbamazepine (TEGRETOL XR) 200 MG 12 hr tablet TAKE 2 TABLETS BY MOUTH AT  BEDTIME (Patient not taking: Reported on 12/01/2019) 180 tablet 3   No current facility-administered medications for this visit.    Medication Side Effects: None  Allergies:  Allergies  Allergen Reactions  . Dilaudid [Hydromorphone Hcl] Other (See Comments)    Nightmares  . Divalproex Sodium     Became comatose when she took Depakote and Geodon together.  . Nsaids     "kidney problem"  . Perphenazine-Amitriptyline Itching  . Seroquel [Quetiapine Fumerate]     Past Medical History:  Diagnosis Date  . Anxiety   . Asthma   . Bipolar disorder ([place])   . Cataracts, bilateral   . Depression   . GERD (gastroesophageal reflux disease)   . Hydatid cyst    Left  . Hypertension   . Kidney problem    RT kidney is smaller -> decreased output  . Migraine   . Osteoporosis 11/2016   T score -2.8  improved from prior DEXA  . Right rotator cuff tear   . Vertigo     Family History  Problem Relation Age of Onset  . Diabetes Father   . Hypertension Father   . Heart disease Father   . Stroke Father   . Cancer Father        Lymphoma  . Hypertension Mother   . Heart disease Mother   . Stroke Mother   . Stroke Paternal Uncle   . Heart disease Brother     Social History   Socioeconomic History  . Marital status: Divorced    Spouse name: Not on file  . Number of children: 1  . Years of education: MSN  . Highest education level:  Not on file  Occupational History  . Not on file  Tobacco Use  . Smoking status: Never Smoker  . Smokeless tobacco: Never Used  Vaping Use  . Vaping Use: Never used  Substance and Sexual Activity  . Alcohol use: Yes    Alcohol/week: 0.0 standard drinks    Comment: Rare  .  Drug use: No  . Sexual activity: Not Currently    Birth control/protection: Post-menopausal    Comment: 1st intercourse 60 yo-2 partners  Other Topics Concern  . Not on file  Social History Narrative   Lives with husband and daughter   Caffeine use: 2 cups per day   Right handed   Social Determinants of Health   Financial Resource Strain:   . Difficulty of Paying Living Expenses:   Food Insecurity:   . Worried About Charity fundraiser in the Last Year:   . Arboriculturist in the Last Year:   Transportation Needs:   . Film/video editor (Medical):   Marland Kitchen Lack of Transportation (Non-Medical):   Physical Activity:   . Days of Exercise per Week:   . Minutes of Exercise per Session:   Stress:   . Feeling of Stress :   Social Connections:   . Frequency of Communication with Friends and Family:   . Frequency of Social Gatherings with Friends and Family:   . Attends Religious Services:   . Active Member of Clubs or Organizations:   . Attends Archivist Meetings:   Marland Kitchen Marital Status:   Intimate Partner Violence:   . Fear of Current or Ex-Partner:   . Emotionally Abused:   Marland Kitchen Physically Abused:   . Sexually Abused:     Past Medical History, Surgical history, Social history, and Family history were reviewed and updated as appropriate.   Please see review of systems for further details on the patient's review from today.   Objective:   Physical Exam:  There were no vitals taken for this visit.  Physical Exam Constitutional:      General: She is not in acute distress. Musculoskeletal:        General: No deformity.  Neurological:     Mental Status: She is alert and oriented to person, place, and time.     Cranial Nerves: No dysarthria.     Coordination: Coordination normal.  Psychiatric:        Attention and Perception: Attention and perception normal. She does not perceive auditory or visual hallucinations.        Mood and Affect: Mood is not anxious or  depressed. Affect is not labile, blunt, angry or inappropriate.        Speech: Speech normal.        Behavior: Behavior normal. Behavior is cooperative.        Thought Content: Thought content normal. Thought content is not paranoid or delusional. Thought content does not include homicidal or suicidal ideation. Thought content does not include homicidal or suicidal plan.        Cognition and Memory: Cognition and memory normal.        Judgment: Judgment normal.     Comments:  Insight intact     Lab Review:     Component Value Date/Time   NA 133 (L) 09/21/2019 1600   K 3.3 (L) 09/21/2019 1600   CL 96 (L) 09/21/2019 1600   CO2 26 09/21/2019 1600   GLUCOSE 110 (H) 09/21/2019 1600   BUN 23 09/21/2019 1600  CREATININE 1.27 (H) 09/21/2019 1600   CALCIUM 9.4 09/21/2019 1600   PROT 7.5 09/21/2019 1600   ALBUMIN 4.7 09/21/2019 1600   AST 23 09/21/2019 1600   ALT 14 09/21/2019 1600   ALKPHOS 57 09/21/2019 1600   BILITOT 0.7 09/21/2019 1600   GFRNONAA 43 (L) 09/21/2019 1600   GFRAA 50 (L) 09/21/2019 1600       Component Value Date/Time   WBC 5.9 09/21/2019 1600   RBC 4.17 09/21/2019 1600   HGB 12.5 09/21/2019 1600   HCT 38.9 09/21/2019 1600   PLT 180 09/21/2019 1600   MCV 93.3 09/21/2019 1600   MCH 30.0 09/21/2019 1600   MCHC 32.1 09/21/2019 1600   RDW 11.7 09/21/2019 1600   LYMPHSABS 1.6 07/14/2018 0653   MONOABS 0.4 07/14/2018 0653   EOSABS 0.2 07/14/2018 0653   BASOSABS 0.0 07/14/2018 0653    No results found for: POCLITH, LITHIUM   No results found for: PHENYTOIN, PHENOBARB, VALPROATE, CBMZ   .res Assessment: Plan:    Mixed bipolar II disorder with rapid cycling ([place])  Generalized anxiety disorder  Insomnia due to mental condition  Migraine without aura and without status migrainosus, not intractable   Greater than 50% of 30 min non face to face time with patient was spent on counseling and coordination of care. She is very sensitive to some mood stabilizers  and is very sensitive about her weight and anything that might cause weight gain.  She also is med sensitive complicating treatment.  Overall however she seen significant mood stability and reduction of manic symptoms and depression with the increase in lamotrigine to this very high dose and the addition of Geodon 20 mg twice daily.  She is not seeing side effects with either medication.  Given the history noted above of difficulty achieving mood stability without unacceptable side effects we will continue the current medications.  Continue lamotrigine to 400 mg twice daily  Continue Geodon 20 mg BID Continue topiramate 200 mg daily.  Continue clonazepam 0.5 mg TID  We discussed the short-term risks associated with benzodiazepines including sedation and increased fall risk among others.  Discussed long-term side effect risk including dependence, potential withdrawal symptoms, and the potential eventual dose-related risk of dementia.  But recent studies from 2020 dispute this association between benzodiazepines and dementia risk. Newer studies in 2020 do not support an association with dementia. Clonazepam is being used as an augmentation for mood stabilization and not simply for anxiety.  She does need it for anxiety however.  This appointment was 30 minutes  Follow-up 6 weeks  Lynder Parents MD, DFAPA   No future appointments.  No orders of the defined types were placed in this encounter.     -------------------------------

## 2019-12-04 ENCOUNTER — Other Ambulatory Visit: Payer: Self-pay | Admitting: Psychiatry

## 2019-12-04 DIAGNOSIS — F3181 Bipolar II disorder: Secondary | ICD-10-CM

## 2019-12-26 ENCOUNTER — Telehealth: Payer: Self-pay | Admitting: *Deleted

## 2019-12-26 MED ORDER — NONFORMULARY OR COMPOUNDED ITEM: 3 refills | Status: DC

## 2019-12-26 NOTE — Telephone Encounter (Signed)
Just FYI)   Patient called to follow up from [place] on 11/23/19 reports the estradiol (estrace) 0.1 mg/gm at local pharmacy is too expensive. She would like compound estradiol 0.02% cream called into [facility] as discussed with Dr.Kendall at office visit. Rx called in.

## 2020-01-02 ENCOUNTER — Emergency Department (EMERGENCY_DEPARTMENT_HOSPITAL)
Admission: EM | Admit: 2020-01-02 | Discharge: 2020-01-02 | Disposition: A | Discharge status: Other Acute Inpatient Hospital | Payer: Medicare Other | Source: Home / Self Care | Attending: Emergency Medicine | Admitting: Emergency Medicine

## 2020-01-02 ENCOUNTER — Other Ambulatory Visit: Payer: Self-pay

## 2020-01-02 ENCOUNTER — Encounter: Payer: Self-pay | Admitting: Psychiatry

## 2020-01-02 ENCOUNTER — Inpatient Hospital Stay
Admission: AD | Admit: 2020-01-02 | Discharge: 2020-01-04 | DRG: 885 | Disposition: A | Discharge status: Home / Self Care | Payer: Medicare Other | Source: Intra-hospital | Attending: Psychiatry | Admitting: Psychiatry

## 2020-01-02 ENCOUNTER — Encounter: Payer: Self-pay | Admitting: Internal Medicine

## 2020-01-02 DIAGNOSIS — Z79899 Other long term (current) drug therapy: Secondary | ICD-10-CM

## 2020-01-02 DIAGNOSIS — G43909 Migraine, unspecified, not intractable, without status migrainosus: Secondary | ICD-10-CM | POA: Diagnosis present

## 2020-01-02 DIAGNOSIS — K219 Gastro-esophageal reflux disease without esophagitis: Secondary | ICD-10-CM | POA: Diagnosis present

## 2020-01-02 DIAGNOSIS — F3130 Bipolar disorder, current episode depressed, mild or moderate severity, unspecified: Secondary | ICD-10-CM | POA: Diagnosis present

## 2020-01-02 DIAGNOSIS — Z823 Family history of stroke: Secondary | ICD-10-CM | POA: Diagnosis not present

## 2020-01-02 DIAGNOSIS — Z888 Allergy status to other drugs, medicaments and biological substances status: Secondary | ICD-10-CM | POA: Diagnosis not present

## 2020-01-02 DIAGNOSIS — I10 Essential (primary) hypertension: Secondary | ICD-10-CM | POA: Diagnosis present

## 2020-01-02 DIAGNOSIS — Z807 Family history of other malignant neoplasms of lymphoid, hematopoietic and related tissues: Secondary | ICD-10-CM | POA: Diagnosis not present

## 2020-01-02 DIAGNOSIS — T50902A Poisoning by unspecified drugs, medicaments and biological substances, intentional self-harm, initial encounter: Secondary | ICD-10-CM

## 2020-01-02 DIAGNOSIS — Z885 Allergy status to narcotic agent status: Secondary | ICD-10-CM

## 2020-01-02 DIAGNOSIS — F319 Bipolar disorder, unspecified: Secondary | ICD-10-CM | POA: Diagnosis not present

## 2020-01-02 DIAGNOSIS — J449 Chronic obstructive pulmonary disease, unspecified: Secondary | ICD-10-CM | POA: Diagnosis present

## 2020-01-02 DIAGNOSIS — Z79891 Long term (current) use of opiate analgesic: Secondary | ICD-10-CM | POA: Diagnosis not present

## 2020-01-02 DIAGNOSIS — Z7982 Long term (current) use of aspirin: Secondary | ICD-10-CM

## 2020-01-02 DIAGNOSIS — Z915 Personal history of self-harm: Secondary | ICD-10-CM

## 2020-01-02 DIAGNOSIS — Z833 Family history of diabetes mellitus: Secondary | ICD-10-CM | POA: Diagnosis not present

## 2020-01-02 DIAGNOSIS — F419 Anxiety disorder, unspecified: Secondary | ICD-10-CM | POA: Diagnosis present

## 2020-01-02 DIAGNOSIS — Z8249 Family history of ischemic heart disease and other diseases of the circulatory system: Secondary | ICD-10-CM

## 2020-01-02 DIAGNOSIS — F3163 Bipolar disorder, current episode mixed, severe, without psychotic features: Secondary | ICD-10-CM | POA: Diagnosis not present

## 2020-01-02 DIAGNOSIS — M810 Age-related osteoporosis without current pathological fracture: Secondary | ICD-10-CM | POA: Diagnosis present

## 2020-01-02 DIAGNOSIS — F3181 Bipolar II disorder: Secondary | ICD-10-CM | POA: Diagnosis present

## 2020-01-02 DIAGNOSIS — Z20822 Contact with and (suspected) exposure to covid-19: Secondary | ICD-10-CM | POA: Diagnosis present

## 2020-01-02 DIAGNOSIS — Z886 Allergy status to analgesic agent status: Secondary | ICD-10-CM | POA: Diagnosis not present

## 2020-01-02 DIAGNOSIS — F310 Bipolar disorder, current episode hypomanic: Secondary | ICD-10-CM | POA: Diagnosis not present

## 2020-01-02 LAB — ETHANOL: Alcohol, Ethyl (B): <10 mg/dL (ref <10)

## 2020-01-02 LAB — URINE DRUG SCREEN, QUALITATIVE (ARMC ONLY)
Amphetamines, Ur Screen: NOT DETECTED
Barbiturates, Ur Screen: NOT DETECTED
Benzodiazepine, Ur Scrn: NOT DETECTED
Cannabinoid 50 Ng, Ur ~~LOC~~: NOT DETECTED
Cocaine Metabolite,Ur ~~LOC~~: NOT DETECTED
MDMA (Ecstasy)Ur Screen: NOT DETECTED
Methadone Scn, Ur: NOT DETECTED
Opiate, Ur Screen: NOT DETECTED
Phencyclidine (PCP) Ur S: NOT DETECTED
Tricyclic, Ur Screen: NOT DETECTED

## 2020-01-02 LAB — CBC WITH DIFFERENTIAL/PLATELET
Abs Immature Granulocytes: 0.01 10*3/uL (ref 0.00–0.07)
Basophils Absolute: 0 10*3/uL (ref 0.0–0.1)
Basophils Relative: 1 %
Eosinophils Absolute: 0.1 10*3/uL (ref 0.0–0.5)
Eosinophils Relative: 1 %
HCT: 40.5 % (ref 36.0–46.0)
Hemoglobin: 13.6 g/dL (ref 12.0–15.0)
Immature Granulocytes: 0 %
Lymphocytes Relative: 14 %
Lymphs Abs: 0.8 10*3/uL (ref 0.7–4.0)
MCH: 30.2 pg (ref 26.0–34.0)
MCHC: 33.6 g/dL (ref 30.0–36.0)
MCV: 89.8 fL (ref 80.0–100.0)
Monocytes Absolute: 0.3 10*3/uL (ref 0.1–1.0)
Monocytes Relative: 6 %
Neutro Abs: 4.7 10*3/uL (ref 1.7–7.7)
Neutrophils Relative %: 78 %
Platelets: 210 10*3/uL (ref 150–400)
RBC: 4.51 MIL/uL (ref 3.87–5.11)
RDW: 11.9 % (ref 11.5–15.5)
WBC: 6 10*3/uL (ref 4.0–10.5)
nRBC: 0 % (ref 0.0–0.2)

## 2020-01-02 LAB — COMPREHENSIVE METABOLIC PANEL
ALT: 19 U/L (ref 0–44)
AST: 28 U/L (ref 15–41)
Albumin: 5 g/dL (ref 3.5–5.0)
Alkaline Phosphatase: 91 U/L (ref 38–126)
Anion gap: 9 (ref 5–15)
BUN: 27 mg/dL — ABNORMAL HIGH (ref 8–23)
CO2: 23 mmol/L (ref 22–32)
Calcium: 9.3 mg/dL (ref 8.9–10.3)
Chloride: 105 mmol/L (ref 98–111)
Creatinine, Ser: 1.58 mg/dL — ABNORMAL HIGH (ref 0.44–1.00)
GFR calc Af Amer: 39 mL/min — ABNORMAL LOW (ref >60)
GFR calc non Af Amer: 33 mL/min — ABNORMAL LOW (ref >60)
Glucose, Bld: 114 mg/dL — ABNORMAL HIGH (ref 70–99)
Potassium: 3.8 mmol/L (ref 3.5–5.1)
Sodium: 137 mmol/L (ref 135–145)
Total Bilirubin: 0.7 mg/dL (ref 0.3–1.2)
Total Protein: 8.4 g/dL — ABNORMAL HIGH (ref 6.5–8.1)

## 2020-01-02 LAB — BLOOD GAS, VENOUS
Acid-base deficit: 12.8 mmol/L — ABNORMAL HIGH (ref 0.0–2.0)
Bicarbonate: 18.5 mmol/L — ABNORMAL LOW (ref 20.0–28.0)
O2 Saturation: 57.9 %
Patient temperature: 37
pCO2, Ven: 67 mmHg — ABNORMAL HIGH (ref 44.0–60.0)
pH, Ven: 7.05 — CL (ref 7.250–7.430)
pO2, Ven: 45 mmHg (ref 32.0–45.0)

## 2020-01-02 LAB — MAGNESIUM: Magnesium: 2.6 mg/dL — ABNORMAL HIGH (ref 1.7–2.4)

## 2020-01-02 LAB — SARS CORONAVIRUS 2 BY RT PCR (HOSPITAL ORDER, PERFORMED IN ~~LOC~~ HOSPITAL LAB): SARS Coronavirus 2: NEGATIVE

## 2020-01-02 LAB — ACETAMINOPHEN LEVEL: Acetaminophen (Tylenol), Serum: <10 ug/mL — ABNORMAL LOW (ref 10–30)

## 2020-01-02 LAB — SALICYLATE LEVEL: Salicylate Lvl: <7 mg/dL — ABNORMAL LOW (ref 7.0–30.0)

## 2020-01-02 MED ORDER — ACETAMINOPHEN 325 MG PO TABS: 650.0000 mg | ORAL_TABLET | Freq: Four times a day (QID) | ORAL | Status: DC | PRN

## 2020-01-02 MED ORDER — DIPHENHYDRAMINE HCL 25 MG PO CAPS
25.0000 mg | ORAL_CAPSULE | Freq: Four times a day (QID) | ORAL | Status: DC | PRN
  Administered 2020-01-02: 25 mg via ORAL
  Filled 2020-01-02: qty 1

## 2020-01-02 MED ORDER — LACTATED RINGERS IV BOLUS
1000.0000 mL | Freq: Once | INTRAVENOUS | Status: AC
  Administered 2020-01-02: 1000 mL via INTRAVENOUS

## 2020-01-02 MED ORDER — TRAZODONE HCL 50 MG PO TABS
50.0000 mg | ORAL_TABLET | Freq: Every evening | ORAL | Status: DC | PRN
  Administered 2020-01-02: 50 mg via ORAL
  Filled 2020-01-02: qty 1

## 2020-01-02 MED ORDER — ALUM & MAG HYDROXIDE-SIMETH 200-200-20 MG/5ML PO SUSP: 30.0000 mL | ORAL | Status: DC | PRN

## 2020-01-02 MED ORDER — MAGNESIUM HYDROXIDE 400 MG/5ML PO SUSP: 30.0000 mL | Freq: Every day | ORAL | Status: DC | PRN

## 2020-01-02 NOTE — Consult Note (Signed)
[place] [place] [facility] Consult   Reason for Consult:  Overdose  Referring Physician:  EDP Patient Identification: Kelli Morales MRN:  [id number] Principal Diagnosis: Bipolar affective disorder, current episode hypomanic ([place]) Diagnosis:  Principal Problem:   Bipolar affective disorder, current episode hypomanic ([place])  Total Time spent with patient: 45 minutes  Subjective:   Kelli Morales is a 69 y.o. female patient admitted with overdose.  Patient seen and evaluated in person by this provider.  She states, "They said I took too many meds and driving crazy and brought me here.  I was acting crazy also."  She is hyperverbal on assessment with pressured speech at times.  Her mother recently died and she told the EDP that she intentionally took an overdose.  Poor sleep, "not sleeping since my mother died" in 10-05-2022.  History of bipolar, hypomanic on assessment, pleasant and cooperative, treated by Dr Charlott Holler.  Inpatient hospitalization needed.  HPI per MD:  Kelli Morales is a 69 y.o. female presents medical history of anxiety, asthma, bipolar disorder, depression, HTN, and migraines who presents via EMS for assessment of concern for drug overdose after she was found somnolent in her work vehicle inside the road.  Patient is placed on monitor provide any history on arrival.  No other history is available from EMS aside from patient having stable vital signs and a blood glucose of 140 in route.  No other history is immediately available.  Past Psychiatric History: bipolar d/o  Risk to Self:  yes Risk to Others:  none Prior Inpatient Therapy:  none Prior Outpatient Therapy:  Dr Charlott Holler  Past Medical History:  Past Medical History:  Diagnosis Date  . Anxiety   . Asthma   . Bipolar disorder ([place])   . Cataracts, bilateral   . Depression   . GERD (gastroesophageal reflux disease)   . Hydatid cyst    Left  . Hypertension   . Kidney problem    RT kidney is smaller -> decreased output   . Migraine   . Osteoporosis 11/2016   T score -2.8  improved from prior DEXA  . Right rotator cuff tear   . Vertigo     Past Surgical History:  Procedure Laterality Date  . BUNIONECTOMY     L foot 07/2011 --RT foot 10/2011  . CATARACT EXTRACTION    . Fimbrioplasty-bilateral-Lysis of adhesions-Excision of Left ovarian cyst  1986  . Hydatid cyst     Left-Adhesions  . IR KYPHO EA ADDL LEVEL THORACIC OR LUMBAR  07/14/2018  . IR KYPHO LUMBAR INC FX REDUCE BONE BX UNI/BIL CANNULATION INC/IMAGING  07/14/2018  . IR RADIOLOGIST EVAL & MGMT  07/20/2018  . ROTATOR CUFF REPAIR     LEFT  . SHOULDER OPEN ROTATOR CUFF REPAIR Right 08/26/2012   Procedure: RIGHT SHOULDER MINI OPEN ROTATOR CUFF REPAIR POSSIBLE PATCH GRAFT AND SUBACROMIAL DECOMPRESSION;  Surgeon: Johnn Hai, MD;  Location: [facility];  Service: Orthopedics;  Laterality: Right;   Family History:  Family History  Problem Relation Age of Onset  . Diabetes Father   . Hypertension Father   . Heart disease Father   . Stroke Father   . Cancer Father        Lymphoma  . Hypertension Mother   . Heart disease Mother   . Stroke Mother   . Stroke Paternal Uncle   . Heart disease Brother    Family Psychiatric  History: none Social History:  Social History   Substance and Sexual  Activity  Alcohol Use Yes  . Alcohol/week: 0.0 standard drinks   Comment: Rare     Social History   Substance and Sexual Activity  Drug Use No    Social History   Socioeconomic History  . Marital status: Divorced    Spouse name: Not on file  . Number of children: 1  . Years of education: MSN  . Highest education level: Not on file  Occupational History  . Not on file  Tobacco Use  . Smoking status: Never Smoker  . Smokeless tobacco: Never Used  Vaping Use  . Vaping Use: Never used  Substance and Sexual Activity  . Alcohol use: Yes    Alcohol/week: 0.0 standard drinks    Comment: Rare  . Drug use: No  . Sexual activity: Not Currently    Birth  control/protection: Post-menopausal    Comment: 1st intercourse 68 yo-2 partners  Other Topics Concern  . Not on file  Social History Narrative   Lives with husband and daughter   Caffeine use: 2 cups per day   Right handed   Social Determinants of Health   Financial Resource Strain:   . Difficulty of Paying Living Expenses:   Food Insecurity:   . Worried About Charity fundraiser in the Last Year:   . Arboriculturist in the Last Year:   Transportation Needs:   . Film/video editor (Medical):   Marland Kitchen Lack of Transportation (Non-Medical):   Physical Activity:   . Days of Exercise per Week:   . Minutes of Exercise per Session:   Stress:   . Feeling of Stress :   Social Connections:   . Frequency of Communication with Friends and Family:   . Frequency of Social Gatherings with Friends and Family:   . Attends Religious Services:   . Active Member of Clubs or Organizations:   . Attends Archivist Meetings:   Marland Kitchen Marital Status:    Additional Social History:    Allergies:   Allergies  Allergen Reactions  . Dilaudid [Hydromorphone Hcl] Other (See Comments)    Nightmares  . Divalproex Sodium     Became comatose when she took Depakote and Geodon together.  . Nsaids     "kidney problem"  . Perphenazine-Amitriptyline Itching  . Seroquel [Quetiapine Fumerate]     Labs:  Results for orders placed or performed during the hospital encounter of 01/02/20 (from the past 48 hour(s))  CBC with Differential     Status: None   Collection Time: 01/02/20 11:22 AM  Result Value Ref Range   WBC 6.0 4.0 - 10.5 K/uL   RBC 4.51 3.87 - 5.11 MIL/uL   Hemoglobin 13.6 12.0 - 15.0 g/dL   HCT 40.5 36 - 46 %   MCV 89.8 80.0 - 100.0 fL   MCH 30.2 26.0 - 34.0 pg   MCHC 33.6 30.0 - 36.0 g/dL   RDW 11.9 11.5 - 15.5 %   Platelets 210 150 - 400 K/uL   nRBC 0.0 0.0 - 0.2 %   Neutrophils Relative % 78 %   Neutro Abs 4.7 1.7 - 7.7 K/uL   Lymphocytes Relative 14 %   Lymphs Abs 0.8 0.7 -  4.0 K/uL   Monocytes Relative 6 %   Monocytes Absolute 0.3 0 - 1 K/uL   Eosinophils Relative 1 %   Eosinophils Absolute 0.1 0 - 0 K/uL   Basophils Relative 1 %   Basophils Absolute 0.0 0 - 0 K/uL  Immature Granulocytes 0 %   Abs Immature Granulocytes 0.01 0.00 - 0.07 K/uL    Comment: Performed at [facility], [address] [place], [place] [address]  Comprehensive metabolic panel     Status: Abnormal   Collection Time: 01/02/20 11:22 AM  Result Value Ref Range   Sodium 137 135 - 145 mmol/L   Potassium 3.8 3.5 - 5.1 mmol/L    Comment: HEMOLYSIS AT THIS LEVEL MAY AFFECT RESULT   Chloride 105 98 - 111 mmol/L   CO2 23 22 - 32 mmol/L   Glucose, Bld 114 (H) 70 - 99 mg/dL    Comment: Glucose reference range applies only to samples taken after fasting for at least 8 hours.   BUN 27 (H) 8 - 23 mg/dL   Creatinine, Ser 1.58 (H) 0.44 - 1.00 mg/dL   Calcium 9.3 8.9 - 10.3 mg/dL   Total Protein 8.4 (H) 6.5 - 8.1 g/dL   Albumin 5.0 3.5 - 5.0 g/dL   AST 28 15 - 41 U/L   ALT 19 0 - 44 U/L   Alkaline Phosphatase 91 38 - 126 U/L   Total Bilirubin 0.7 0.3 - 1.2 mg/dL   GFR calc non Af Amer 33 (L) >60 mL/min   GFR calc Af Amer 39 (L) >60 mL/min   Anion gap 9 5 - 15    Comment: Performed at [facility], [address] [place], [place] [address]  Blood gas, venous     Status: Abnormal   Collection Time: 01/02/20 11:22 AM  Result Value Ref Range   pH, Ven 7.05 (LL) 7.25 - 7.43    Comment: CRITICAL RESULT CALLED TO, READ BACK BY AND VERIFIED WITH:  Hulan Saas MD AT 1210, 01/02/2020, LS    pCO2, Ven 67 (H) 44 - 60 mmHg   pO2, Ven 45.0 32 - 45 mmHg   Bicarbonate 18.5 (L) 20.0 - 28.0 mmol/L   Acid-base deficit 12.8 (H) 0.0 - 2.0 mmol/L   O2 Saturation 57.9 %   Patient temperature 37.0    Collection site VENOUS    Sample type VENOUS     Comment: Performed at [facility], [address] [place], [place] [address]  Magnesium     Status: Abnormal   Collection  Time: 01/02/20 11:22 AM  Result Value Ref Range   Magnesium 2.6 (H) 1.7 - 2.4 mg/dL    Comment: Performed at [facility], [address] [place], [place] [address]  SARS Coronavirus 2 by RT PCR (hospital order, performed in [facility] hospital lab) Nasopharyngeal Nasopharyngeal Swab     Status: None   Collection Time: 01/02/20 11:39 AM   Specimen: Nasopharyngeal Swab  Result Value Ref Range   SARS Coronavirus 2 NEGATIVE NEGATIVE    Comment: (NOTE) SARS-CoV-2 target nucleic acids are NOT DETECTED.  The SARS-CoV-2 RNA is generally detectable in upper and lower respiratory specimens during the acute phase of infection. The lowest concentration of SARS-CoV-2 viral copies this assay can detect is 250 copies / mL. A negative result does not preclude SARS-CoV-2 infection and should not be used as the sole basis for treatment or other patient management decisions.  A negative result may occur with improper specimen collection / handling, submission of specimen other than nasopharyngeal swab, presence of viral mutation(s) within the areas targeted by this assay, and inadequate number of viral copies (<250 copies / mL). A negative result must be combined with clinical observations, patient history, and epidemiological information.  Fact Sheet for Patients:   StrictlyIdeas.no  Fact Sheet for Healthcare Providers: BankingDealers.co.za  This test is not yet approved or  cleared by the [place] FDA and has been authorized for detection and/or diagnosis of SARS-CoV-2 by FDA under an Emergency Use Authorization (EUA).  This EUA will remain in effect (meaning this test can be used) for the duration of the COVID-19 declaration under Section 564(b)(1) of the Act, 21 U.S.C. section 360bbb-3(b)(1), unless the authorization is terminated or revoked sooner.  Performed at [facility], [address] [place], [place]  [address]   Acetaminophen level     Status: Abnormal   Collection Time: 01/02/20 11:53 AM  Result Value Ref Range   Acetaminophen (Tylenol), Serum <10 (L) 10 - 30 ug/mL    Comment: (NOTE) Therapeutic concentrations vary significantly. A range of 10-30 ug/mL  may be an effective concentration for many patients. However, some  are best treated at concentrations outside of this range. Acetaminophen concentrations >150 ug/mL at 4 hours after ingestion  and >50 ug/mL at 12 hours after ingestion are often associated with  toxic reactions.  Performed at [facility], [address] [place], [place] [address]   Ethanol     Status: None   Collection Time: 01/02/20 11:53 AM  Result Value Ref Range   Alcohol, Ethyl (B) <10 <10 mg/dL    Comment: (NOTE) Lowest detectable limit for serum alcohol is 10 mg/dL.  For medical purposes only. Performed at [facility], [address] [place], [place] [address]   Salicylate level     Status: Abnormal   Collection Time: 01/02/20 11:53 AM  Result Value Ref Range   Salicylate Lvl <2.2 (L) 7.0 - 30.0 mg/dL    Comment: Performed at [facility], [address] [place], [place] [address]    No current facility-administered medications for this encounter.   Current Outpatient Medications  Medication Sig Dispense Refill  . ACETAMINOPHEN 8 HOUR PO Take 650 mg by mouth as needed.    Marland Kitchen albuterol (VENTOLIN HFA) 108 (90 Base) MCG/ACT inhaler     . amLODipine (NORVASC) 5 MG tablet Take 5 mg by mouth daily.    . Ascorbic Acid (VITAMIN C PO) Take 1 tablet by mouth daily.    Marland Kitchen aspirin 81 MG tablet Take 325 mg by mouth 3 (three) times a week. Min, Wed, Fri    . baclofen (LIORESAL) 10 MG tablet Take 10 mg by mouth 3 (three) times daily.    Marland Kitchen CALCIUM PO Take 1 tablet by mouth daily.    . cholecalciferol (VITAMIN D) 1000 UNITS tablet Take 1,000 Units by mouth 2 (two) times daily.     . clonazePAM (KLONOPIN) 0.5 MG tablet Take 1  tablet (0.5 mg total) by mouth 3 (three) times daily as needed for anxiety (and insomnia). 270 tablet 0  . Cyanocobalamin (VITAMIN B 12 PO) Take by mouth.    . denosumab (PROLIA) 60 MG/ML SOLN injection Inject 60 mg into the skin every 6 (six) months. Administer in upper arm, thigh, or abdomen    . fish oil-omega-3 fatty acids 1000 MG capsule Take 1 g by mouth daily.    . fluticasone (FLONASE) 50 MCG/ACT nasal spray Place 2 sprays into the nose daily.    . Fluticasone Furoate (ARNUITY ELLIPTA IN) Inhale 1 puff into the lungs at bedtime.     . Glycerin-Hypromellose-PEG 400 ([facility] DRY EYE RELIEF OP) Apply 1 drop to eye at bedtime as needed (  dry eyes).    Marland Kitchen guaiFENesin (MUCINEX PO) Take 1 tablet by mouth as needed.     . IRON PO Take 1 tablet by mouth daily.    . L-FORMULA LYSINE HCL PO Take by mouth.    . lamoTRIgine (LAMICTAL) 200 MG tablet Take 2 tablets (400 mg total) by mouth 2 (two) times daily. 360 tablet 1  . magnesium gluconate (MAGONATE) 500 MG tablet Take 500 mg by mouth daily.    . meclizine (ANTIVERT) 12.5 MG tablet Take 1 tablet (12.5 mg total) by mouth 3 (three) times daily as needed for dizziness. 30 tablet 0  . Multiple Vitamins-Minerals (COMPLETE MULTIVITAMIN/MINERAL PO) Take 1 tablet by mouth daily.    . NONFORMULARY OR COMPOUNDED ITEM Estradiol vaginal cream 0.02% insert 1 gram vaginally twice weekly 90 each 3  . pantoprazole (PROTONIX) 40 MG tablet Take 40 mg by mouth daily.    . phenylephrine (SUDAFED PE) 10 MG TABS tablet Take 10 mg by mouth every 4 (four) hours as needed.     . polyethylene glycol (MIRALAX / GLYCOLAX) packet Take 17 g by mouth 3 (three) times a week.    . Potassium 99 MG TABS Take by mouth.    . promethazine (PHENERGAN) 25 MG tablet Take 25 mg by mouth every 6 (six) hours as needed. Nausea    . topiramate (TOPAMAX) 200 MG tablet Take 1 tablet (200 mg total) by mouth daily. 90 tablet 1  . traMADol (ULTRAM) 50 MG tablet Take 1-2 tablets by mouth as needed.    .  traZODone (DESYREL) 100 MG tablet TAKE 3 TABLETS BY MOUTH AT  BEDTIME 270 tablet 3  . ziprasidone (GEODON) 20 MG capsule Take 1 capsule (20 mg total) by mouth 2 (two) times daily with a meal. 60 capsule 2  . zonisamide (ZONEGRAN) 100 MG capsule Take 300 mg by mouth daily.       Musculoskeletal: Strength & Muscle Tone: within normal limits Gait & Station: normal Patient leans: N/A  Psychiatric Specialty Exam: Physical Exam Vitals and nursing note reviewed.  Constitutional:      Appearance: Normal appearance.  HENT:     Head: Normocephalic.     Nose: Nose normal.  Cardiovascular:     Pulses: Normal pulses.  Pulmonary:     Effort: Pulmonary effort is normal.  Musculoskeletal:        General: Normal range of motion.     Cervical back: Normal range of motion.  Neurological:     General: No focal deficit present.     Mental Status: She is alert and oriented to person, place, and time.  Psychiatric:        Attention and Perception: Attention and perception normal.        Mood and Affect: Mood is depressed.        Speech: Speech normal.        Behavior: Behavior is cooperative.        Thought Content: Thought content includes suicidal ideation. Thought content includes suicidal plan.        Cognition and Memory: Cognition is impaired. Memory is impaired.        Judgment: Judgment is inappropriate.     Review of Systems  Psychiatric/Behavioral: Positive for dysphoric mood. The patient is nervous/anxious.   All other systems reviewed and are negative.   Blood pressure 104/76, pulse 60, temperature 98.2 F (36.8 C), temperature source Oral, resp. rate 12, height 5\' 5"  (1.651 m), weight 65.8 kg, SpO2 97 %.  Body mass index is 24.14 kg/m.  General Appearance: Casual  Eye Contact:  Fair  Speech:  Pressured at times  Volume:  Normal  Mood:  Anxious and Depressed  Affect:  Congruent  Thought Process:  Coherent and Descriptions of Associations: Intact  Orientation:  Full (Time,  Place, and Person)  Thought Content:  Rumination  Suicidal Thoughts:  Yes.  without intent/plan  Homicidal Thoughts:  No  Memory:  Immediate;   Fair Recent;   Fair Remote;   Fair  Judgement:  Poor  Insight:  Lacking  Psychomotor Activity:  Normal  Concentration:  Concentration: Fair and Attention Span: Fair  Recall:  [employer] of Knowledge:  Fair  Language:  Good  Akathisia:  No  Handed:  Right  AIMS (if indicated):     Assets:  Housing Leisure Time Physical Health Resilience Social Support  ADL's:  Intact  Cognition:  WNL  Sleep:        Treatment Plan Summary: Daily contact with patient to assess and evaluate symptoms and progress in treatment, Medication management and Plan bipolar affective disorder, hypomania:    -Admit inpatient hospitalization -Refrain from medication restart until poison control clearance  Disposition: Recommend psychiatric Inpatient admission when medically cleared.  Waylan Boga, NP 01/02/2020 2:54 PM

## 2020-01-02 NOTE — ED Notes (Signed)
Patient belongings patient has 2 bags   1 brown pocketbook 1 pr black shoes 1 black bra  1 pair black pants 1 black/white shirt 1 blk/ white jacket shirt 1 iphone red case 1 pair black panties 1 pr black knee highs

## 2020-01-02 NOTE — ED Notes (Signed)
[facility] PENDING  CONSULT ?

## 2020-01-02 NOTE — Plan of Care (Signed)
  Problem: Education: Goal: Knowledge of [place] [facility] Education information/materials will improve Outcome: Not Progressing Goal: Emotional status will improve Outcome: Not Progressing Goal: Mental status will improve Outcome: Not Progressing Goal: Verbalization of understanding the information provided will improve Outcome: Not Progressing   Problem: Activity: Goal: Interest or engagement in activities will improve Outcome: Not Progressing Goal: Sleeping patterns will improve Outcome: Not Progressing Patient is a new admit to the unit.  Problem: Coping: Goal: Ability to verbalize frustrations and anger appropriately will improve Outcome: Not Progressing Goal: Ability to demonstrate self-control will improve Outcome: Not Progressing   Problem: Health Behavior/Discharge Planning: Goal: Identification of resources available to assist in meeting health care needs will improve Outcome: Not Progressing Goal: Compliance with treatment plan for underlying cause of condition will improve Outcome: Not Progressing   Problem: Physical Regulation: Goal: Ability to maintain clinical measurements within normal limits will improve Outcome: Not Progressing   Problem: Safety: Goal: Periods of time without injury will increase Outcome: Not Progressing

## 2020-01-02 NOTE — ED Triage Notes (Signed)
Patient arrived via [facility], due to possible drug overdose. 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 Patient per EMS responded briefly when asked her name and what she had taken, patient stated Baclofen and Tramodol. Per EMS patient appeared to be getting worse the closer they got to hospital. VSS, patient alert when yell at her, patient oriented to name only.

## 2020-01-02 NOTE — BH Assessment (Addendum)
Patient can come down at 8pm  Call to give report: [phone]  Patient is to be admitted to [facility] by Dr. Janese Banks.  Attending Physician will be. Dr. Janese Banks.   Patient has been assigned to room 316, by [place], RN.   Intake Paper Work has been signed and placed on patient chart.  ER staff is aware of the admission: 1. Otho Perl, ER Secretary  2. Siadecki, ER MD  3. Abigail Butts Patient's Nurse  4. THO Patient Access.

## 2020-01-02 NOTE — ED Provider Notes (Signed)
[facility] Emergency Department Provider Note  ____________________________________________   First MD Initiated Contact with Patient 01/02/20 [phone]     (approximate)  I have reviewed the triage vital signs and the nursing notes.   HISTORY  Chief Complaint Drug Overdose   HPI Kelli Morales is a 69 y.o. female presents medical history of anxiety, asthma, bipolar disorder, depression, HTN, and migraines who presents via EMS for assessment of concern for drug overdose after she was found somnolent in her work vehicle inside the road.  Patient is placed on monitor provide any history on arrival.  No other history is available from EMS aside from patient having stable vital signs and a blood glucose of 140 in route.  No other history is immediately available.       Past Medical History:  Diagnosis Date  . Anxiety   . Asthma   . Bipolar disorder ([place])   . Cataracts, bilateral   . Depression   . GERD (gastroesophageal reflux disease)   . Hydatid cyst    Left  . Hypertension   . Kidney problem    RT kidney is smaller -> decreased output  . Migraine   . Osteoporosis 11/2016   T score -2.8  improved from prior DEXA  . Right rotator cuff tear   . Vertigo     Patient Active Problem List   Diagnosis Date Noted  . Vasovagal syncope 08/18/2018  . CKD (chronic kidney disease) stage 3, GFR 30-59 ml/min 08/18/2018  . Fall 07/12/2018  . 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 Right rotator cuff tear 08/26/2012  . Hypertension   . Kidney problem   . Osteoporosis   . Asthma   . Bipolar affective disorder, current episode hypomanic ([place])   . Migraine   . Hydatid cyst     Past Surgical History:  Procedure Laterality Date  . BUNIONECTOMY     L foot 07/2011 --RT foot 10/2011  . CATARACT EXTRACTION    . Fimbrioplasty-bilateral-Lysis of adhesions-Excision of Left ovarian cyst  1986  . Hydatid cyst     Left-Adhesions  . IR KYPHO EA ADDL LEVEL THORACIC OR LUMBAR   07/14/2018  . IR KYPHO LUMBAR INC FX REDUCE BONE BX UNI/BIL CANNULATION INC/IMAGING  07/14/2018  . IR RADIOLOGIST EVAL & MGMT  07/20/2018  . ROTATOR CUFF REPAIR     LEFT  . SHOULDER OPEN ROTATOR CUFF REPAIR Right 08/26/2012   Procedure: RIGHT SHOULDER MINI OPEN ROTATOR CUFF REPAIR POSSIBLE PATCH GRAFT AND SUBACROMIAL DECOMPRESSION;  Surgeon: Johnn Hai, MD;  Location: [facility];  Service: Orthopedics;  Laterality: Right;    Prior to Admission medications   Medication Sig Start Date End Date Taking? Authorizing Provider  ACETAMINOPHEN 8 HOUR PO Take 650 mg by mouth as needed.    [provider]  albuterol (VENTOLIN HFA) 108 (90 Base) MCG/ACT inhaler  03/17/19   [provider]  amLODipine (NORVASC) 5 MG tablet Take 5 mg by mouth daily. 10/13/18   [provider]  Ascorbic Acid (VITAMIN C PO) Take 1 tablet by mouth daily.    [provider]  aspirin 81 MG tablet Take 325 mg by mouth 3 (three) times a week. Min, Wed, Fri    [provider]  baclofen (LIORESAL) 10 MG tablet Take 10 mg by mouth 3 (three) times daily.    [provider]  CALCIUM PO Take 1 tablet by mouth daily.    [provider]  cholecalciferol (VITAMIN D) 1000  UNITS tablet Take 1,000 Units by mouth 2 (two) times daily.     [provider]  clonazePAM (KLONOPIN) 0.5 MG tablet Take 1 tablet (0.5 mg total) by mouth 3 (three) times daily as needed for anxiety (and insomnia). 12/01/19   Cottle, Billey Co., MD  Cyanocobalamin (VITAMIN B 12 PO) Take by mouth.    [provider]  denosumab (PROLIA) 60 MG/ML SOLN injection Inject 60 mg into the skin every 6 (six) months. Administer in upper arm, thigh, or abdomen    [provider]  fish oil-omega-3 fatty acids 1000 MG capsule Take 1 g by mouth daily.    [provider]  fluticasone (FLONASE) 50 MCG/ACT nasal spray Place 2 sprays into the nose daily.    [provider]  Fluticasone  Furoate (ARNUITY ELLIPTA IN) Inhale 1 puff into the lungs at bedtime.     [provider]  Glycerin-Hypromellose-PEG 400 ([employer] DRY EYE RELIEF OP) Apply 1 drop to eye at bedtime as needed (dry eyes).    [provider]  guaiFENesin (MUCINEX PO) Take 1 tablet by mouth as needed.     [provider]  IRON PO Take 1 tablet by mouth daily.    [provider]  L-FORMULA LYSINE HCL PO Take by mouth.    [provider]  lamoTRIgine (LAMICTAL) 200 MG tablet Take 2 tablets (400 mg total) by mouth 2 (two) times daily. 12/01/19   Cottle, Billey Co., MD  magnesium gluconate (MAGONATE) 500 MG tablet Take 500 mg by mouth daily.    [provider]  meclizine (ANTIVERT) 12.5 MG tablet Take 1 tablet (12.5 mg total) by mouth 3 (three) times daily as needed for dizziness. 09/21/19   Lucrezia Starch, MD  Multiple Vitamins-Minerals (COMPLETE MULTIVITAMIN/MINERAL PO) Take 1 tablet by mouth daily.    [provider]  NONFORMULARY OR COMPOUNDED ITEM Estradiol vaginal cream 0.02% insert 1 gram vaginally twice weekly 12/26/19   Joseph Pierini, MD  pantoprazole (PROTONIX) 40 MG tablet Take 40 mg by mouth daily.    [provider]  phenylephrine (SUDAFED PE) 10 MG TABS tablet Take 10 mg by mouth every 4 (four) hours as needed.     [provider]  polyethylene glycol (MIRALAX / GLYCOLAX) packet Take 17 g by mouth 3 (three) times a week.    [provider]  Potassium 99 MG TABS Take by mouth.    [provider]  promethazine (PHENERGAN) 25 MG tablet Take 25 mg by mouth every 6 (six) hours as needed. Nausea    [provider]  topiramate (TOPAMAX) 200 MG tablet Take 1 tablet (200 mg total) by mouth daily. 12/01/19   Cottle, Billey Co., MD  traMADol Veatrice Bourbon) 50 MG tablet Take 1-2 tablets by mouth as needed. 08/02/18   [provider]  traZODone (DESYREL) 100 MG tablet TAKE 3 TABLETS BY MOUTH AT  BEDTIME 11/17/19    Cottle, Billey Co., MD  ziprasidone (GEODON) 20 MG capsule Take 1 capsule (20 mg total) by mouth 2 (two) times daily with a meal. 12/05/19   Cottle, Billey Co., MD  zonisamide (ZONEGRAN) 100 MG capsule Take 300 mg by mouth daily.     [provider]    Allergies Dilaudid [hydromorphone hcl], Divalproex sodium, Nsaids, Perphenazine-amitriptyline, and Seroquel [quetiapine fumerate]  Family History  Problem Relation Age of Onset  . Diabetes Father   . Hypertension Father   . Heart disease Father   .  Stroke Father   . Cancer Father        Lymphoma  . Hypertension Mother   . Heart disease Mother   . Stroke Mother   . Stroke Paternal Uncle   . Heart disease Brother     Social History Social History   Tobacco Use  . Smoking status: Never Smoker  . Smokeless tobacco: Never Used  Vaping Use  . Vaping Use: Never used  Substance Use Topics  . Alcohol use: Yes    Alcohol/week: 0.0 standard drinks    Comment: Rare  . Drug use: No    Review of Systems  Review of Systems  Unable to perform ROS: Mental status change    ____________________________________________   PHYSICAL EXAM:  VITAL SIGNS: ED Triage Vitals  Enc Vitals Group     BP 01/02/20 1006 120/70     Pulse Rate 01/02/20 1006 60     Resp 01/02/20 1006 12     Temp 01/02/20 1006 98.2 F (36.8 C)     Temp Source 01/02/20 1006 Oral     SpO2 01/02/20 0958 96 %     Weight 01/02/20 1008 145 lb 1 oz (65.8 kg)     Height 01/02/20 1008 5\' 5"  (1.651 m)     Head Circumference --      Peak Flow --      Pain Score 01/02/20 1008 0     Pain Loc --      Pain Edu? --      Excl. in [place]? --    Physical Exam Vitals and nursing note reviewed.  HENT:     Head: Normocephalic and atraumatic.     Right Ear: External ear normal.     Left Ear: External ear normal.     Nose: Nose normal.     Mouth/Throat:     Mouth: Mucous membranes are dry.  Eyes:     Conjunctiva/sclera: Conjunctivae normal.  Cardiovascular:      Rate and Rhythm: Normal rate and regular rhythm.     Pulses: Normal pulses.  Pulmonary:     Effort: Pulmonary effort is normal. No respiratory distress.     Breath sounds: No wheezing.  Abdominal:     General: There is no distension.     Palpations: Abdomen is soft.     Tenderness: There is no abdominal tenderness.  Musculoskeletal:     Right lower leg: No edema.     Left lower leg: No edema.  Skin:    General: Skin is warm.     Capillary Refill: Capillary refill takes 2 to 3 seconds.  Neurological:     General: No focal deficit present.     Mental Status: She is disoriented and confused.  Psychiatric:        Cognition and Memory: Cognition is impaired.    On arrival with significant verbal stimulation patient is able to give a thumbs up to this examiner in both hands and move her toes on command.  She is able to state her name but is not oriented to date or year or immediate events leading to her coming to emergency room. ____________________________________________   LABS (all labs ordered are listed, but only abnormal results are displayed)  CBC unremarkable.  BMP remarkable for a creatinine of 1.58 which is compared to that of 1.27 obtained 3 months ago otherwise no significant metabolic derangements.  Magnesium 2.6.  Serum acetaminophen, salicylates, ethanol unremarkable.  UDS unremarkable.  Initial VBG with a pH of  7.05, PCO2 of 67, and HCO3 of 18.5.  Repeat VBG with a pH of 7.29, PCO2 of 44, and HCO3 of 21.2. ____________________________________________  EKG  Slightly prolonged PR interval at 230 with otherwise unremarkable intervals and no evidence of acute ischemia or other underlying arrhythmia. ____________________________________________   ____________________________________________   INITIAL IMPRESSION / ASSESSMENT AND PLAN / ED COURSE        On arrival patient's initial presentation is most concerning for overdose of undetermined intent with evidence on  initial VBG of acute hypercarbic respiratory failure.  However over the course of the next 30 minutes to 1 hour patient was noted to have improvement in her mental status and throughout this time and end-tidal CO2 of 35-40 with SPO2 saturations greater than 95% and respiratory rate between 25 and 30.  Labs otherwise largely unremarkable and patient's overall presentation is not consistent with acute traumatic injury, sepsis, CVA, or other clear etiology for altered mental status.  In addition given she had improving mental status on multiple reassessments indicative of metabolism of ingested substances have low suspicion for other etiologies for initial altered mental status.  On several reassessments patient stated she had been feeling more depressed over the last several weeks in light of her mother's recent death and that she knows she took more medication than she was supposed to specifically of her tramadol, baclofen, and Klonopin because she "did not want to feel so bad anymore".  Patient denies any overt SI, HI, or hallucinations given concern for possible intentional ingestion in setting of multiple psychiatric morbidities and recent stressors psychiatry service was consulted to make further recommendations.  Patient was signed out to oncoming provider at approximately 1600 with plan to follow-up psychiatry recommendations.        ____________________________________________   FINAL CLINICAL IMPRESSION(S) / ED DIAGNOSES  Final diagnoses:  Intentional drug overdose, initial encounter [facility])     ED Discharge Orders    None       Note:  This document was prepared using Dragon voice recognition software and may include unintentional dictation errors.   Lucrezia Starch, MD 01/02/20 [phone]

## 2020-01-02 NOTE — ED Notes (Signed)
Patient appears to be becoming more alert, she is asking for water, and keeps sitting up in her bed, saying she is ready to go home. Patient is redirectable and cooperative, denies having any pain and denies feeling sick to stomach

## 2020-01-02 NOTE — BH Assessment (Signed)
Assessment Note Kelli Morales is an 69 y.o. female who presents to [facility] ED involuntarily for treatment. Per triage note, Patient arrived via [facility], due to possible drug overdose. 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 Patient per EMS responded briefly when asked her name and what she had taken, patient stated Baclofen and Tramodol. Per EMS patient appeared to be getting worse the closer they got to hospital. VSS, patient alert when yell at her, patient oriented to name only.   During TTS assessment pt presents alert and oriented x 2, hyper manic depression but cooperative, and mood-congruent with affect. The pt does not appear to be responding to internal or external stimuli. Neither is the pt presenting with any delusional thinking. Pt verified some of the information provided to triage RN. Pt reports to be unsure why she is here and stated "I was stopped by state patrol and they said I probably took to many medications because I was driving crazy". Pt is unable to provide further details at this time. Pt stated "I don't remember where I came from or where I was going". Pt reports endorsing mood swings, disorganized thoughts, insomnia and difficulty remembering things very often. Pt confirmed medications compliance and stated "Sometimes my medications work and sometimes they don't". Pt denied misuse of her medications or an attempt to commit suicide. Pt however, describes potential misuse of medications stating "sometimes I only take certain ones to help me sleep". Pt reports struggles sleeping since the loss of her mother in April, 2021 and to be unsure of the number of hours she is sleeping. Pt denies eating struggles. Pt reports a MH hx of Bipolar and current OPT services with Dr. Clovis Pu every three months [facility]) for med management. Pt denies an INPT hx or substance use. 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 Pt denies any current  SI/HI/AH/VH but refused to provide a collateral contact stating "I do not want you talking to my daughter or my ex-husband". Pt unable to contract for safety at this time.   Per Waylan Boga, NP pt is recommended for overnight observation and reassess in the morning.    Diagnosis: Bipolar I disorder, Current or most recent episode depressed  Past Medical History:  Past Medical History:  Diagnosis Date  . Anxiety   . Asthma   . Bipolar disorder ([place])   . Cataracts, bilateral   . Depression   . GERD (gastroesophageal reflux disease)   . Hydatid cyst    Left  . Hypertension   . Kidney problem    RT kidney is smaller -> decreased output  . Migraine   . Osteoporosis 11/2016   T score -2.8  improved from prior DEXA  . Right rotator cuff tear   . Vertigo     Past Surgical History:  Procedure Laterality Date  . BUNIONECTOMY     L foot 07/2011 --RT foot 10/2011  . CATARACT EXTRACTION    . Fimbrioplasty-bilateral-Lysis of adhesions-Excision of Left ovarian cyst  1986  . Hydatid cyst     Left-Adhesions  . IR KYPHO EA ADDL LEVEL THORACIC OR LUMBAR  07/14/2018  . IR KYPHO LUMBAR INC FX REDUCE BONE BX UNI/BIL CANNULATION INC/IMAGING  07/14/2018  . IR RADIOLOGIST EVAL & MGMT  07/20/2018  . ROTATOR CUFF REPAIR     LEFT  . SHOULDER OPEN ROTATOR CUFF REPAIR Right 08/26/2012   Procedure: RIGHT  SHOULDER MINI OPEN ROTATOR CUFF REPAIR POSSIBLE PATCH GRAFT AND SUBACROMIAL DECOMPRESSION;  Surgeon: Johnn Hai, MD;  Location: [facility];  Service: Orthopedics;  Laterality: Right;    Family History:  Family History  Problem Relation Age of Onset  . Diabetes Father   . Hypertension Father   . Heart disease Father   . Stroke Father   . Cancer Father        Lymphoma  . Hypertension Mother   . Heart disease Mother   . Stroke Mother   . Stroke Paternal Uncle   . Heart disease Brother     Social History:  reports that she has never smoked. She has never used smokeless tobacco. She reports current  alcohol use. She reports that she does not use drugs.  Additional Social History:  Alcohol / Drug Use Pain Medications: see mar Prescriptions: see mar Over the Counter: see mar History of alcohol / drug use?: No history of alcohol / drug abuse  CIWA: CIWA-Ar BP: 104/76 Pulse Rate: 60 COWS:    Allergies:  Allergies  Allergen Reactions  . Dilaudid [Hydromorphone Hcl] Other (See Comments)    Nightmares  . Divalproex Sodium     Became comatose when she took Depakote and Geodon together.  . Nsaids     "kidney problem"  . Perphenazine-Amitriptyline Itching  . Seroquel [Quetiapine Fumerate]     Home Medications: (Not in a hospital admission)   OB/GYN Status:  No LMP recorded. Patient is postmenopausal.  General Assessment Data Location of Assessment: [facility] ED TTS Assessment: In system Is this a Tele or Face-to-Face Assessment?: Face-to-Face Is this an Initial Assessment or a Re-assessment for this encounter?: Initial Assessment Patient Accompanied by:: N/A Language Other than English: No Living Arrangements: Other (Comment) (private home ) What gender do you identify as?: Female Date Telepsych consult ordered in [facility]: 01/02/20 Marital status: Divorced [place] name: unknown  Pregnancy Status: No Living Arrangements: Alone Can pt return to current living arrangement?: Yes Admission Status: Involuntary Petitioner: Police Is patient capable of signing voluntary admission?: Yes Referral Source: Other Insurance type: Production manager Exam ([place]) Medical Exam completed: Yes  Crisis Care Plan Living Arrangements: Alone Legal Guardian:  (self ) Name of Psychiatrist: Dr. Clovis Pu   Education Status Is patient currently in school?: No Is the patient employed, unemployed or receiving disability?: Employed ([employer] )  Risk to self with the past 6 months Suicidal Ideation: No Has patient been a risk to self within the past 6 months  prior to admission? : No Suicidal Intent: No Has patient had any suicidal intent within the past 6 months prior to admission? : No Is patient at risk for suicide?: No, but patient needs Medical Clearance Suicidal Plan?: No Has patient had any suicidal plan within the past 6 months prior to admission? : No Access to Means: No What has been your use of drugs/alcohol within the last 12 months?: None reported  Previous Attempts/Gestures: No How many times?: 0 Other Self Harm Risks: None reported  Triggers for Past Attempts: None known Intentional Self Injurious Behavior: None Family Suicide History: No Recent stressful life event(s): Loss (Comment) (Mother in April, 21) Persecutory voices/beliefs?: No Depression: Yes Depression Symptoms: Insomnia Substance abuse history and/or treatment for substance abuse?: No Suicide prevention information given to non-admitted patients: Not applicable  Risk to Others within the past 6 months Homicidal Ideation: No Does patient have any lifetime risk of violence toward others beyond  the six months prior to admission? : No Thoughts of Harm to Others: No Current Homicidal Intent: No Current Homicidal Plan: No Access to Homicidal Means: No Identified Victim: n/a History of harm to others?: No Assessment of Violence: None Noted Violent Behavior Description: n/a Does patient have access to weapons?: No Criminal Charges Pending?: Yes Describe Pending Criminal Charges: Driving charge  Does patient have a court date: Yes Court Date:  (Pt reports to be unsure) Is patient on probation?: Unknown  Psychosis Hallucinations: None noted Delusions: None noted  Mental Status Report Appearance/Hygiene: Unremarkable Eye Contact: Good Motor Activity: Freedom of movement Speech: Pressured Level of Consciousness: Alert Mood: Depressed, Anxious, Pleasant Affect: Depressed, Anxious Anxiety Level: Minimal Thought Processes: Relevant, Thought  Blocking Judgement: Partial Orientation: Not oriented Obsessive Compulsive Thoughts/Behaviors: None  Cognitive Functioning Concentration: Fair Memory: Recent Impaired, Remote Impaired Is patient IDD: No Insight: Fair Impulse Control: Fair Appetite: Good Have you had any weight changes? : No Change Sleep: Decreased Total Hours of Sleep:  (pt reports to be unsure ) Vegetative Symptoms: None     Prior Inpatient Therapy Prior Inpatient Therapy: No  Prior Outpatient Therapy Prior Outpatient Therapy: current med management [facility]) Does patient have an ACCT team?: No Does patient have Intensive In-House Services?  : No Does patient have [facility] services? : Unknown Does patient have P4CC services?: Unknown  ADL Screening (condition at time of admission) Is the patient deaf or have difficulty hearing?: No Does the patient have difficulty concentrating, remembering, or making decisions?: No Does the patient have difficulty dressing or bathing?: No Does the patient have difficulty walking or climbing stairs?: No Weakness of Legs: None Weakness of Arms/Hands: None  Home Assistive Devices/Equipment Home Assistive Devices/Equipment: None  Therapy Consults (therapy consults require a physician order) PT Evaluation Needed: No OT Evalulation Needed: No SLP Evaluation Needed: No Abuse/Neglect Assessment (Assessment to be complete while patient is alone) Abuse/Neglect Assessment Can Be Completed: Yes Physical Abuse: Denies Verbal Abuse: Denies Sexual Abuse: Denies Exploitation of patient/patient's resources: Denies Self-Neglect: Denies Values / Beliefs Cultural Requests During Hospitalization: None Spiritual Requests During Hospitalization: None Consults Spiritual Care Consult Needed: No Transition of Care Team Consult Needed: No            Disposition:  Disposition Initial Assessment Completed for this Encounter: Yes Patient referred to: Other (Comment)  On Site  Evaluation by:   Reviewed with Physician:    Shanon Ace 01/02/2020 3:44 PM

## 2020-01-02 NOTE — ED Notes (Signed)
This RN received call from poison control who states pt is cleared from their services.

## 2020-01-02 NOTE — Tx Team (Signed)
Initial Treatment Plan 01/02/2020 10:09 PM [name] Leonhard VCB:[id number]    PATIENT STRESSORS: Other: Being hospitalized    PATIENT STRENGTHS: Capable of independent living Communication skills General fund of knowledge Supportive family/friends Work skills   PATIENT IDENTIFIED PROBLEMS: Pt denies having any issues at this time.                     DISCHARGE CRITERIA:  Ability to meet basic life and health needs Improved stabilization in mood, thinking, and/or behavior Verbal commitment to aftercare and medication compliance  PRELIMINARY DISCHARGE PLAN: Outpatient therapy Return to previous living arrangement Return to previous work or school arrangements  PATIENT/FAMILY INVOLVEMENT: This treatment plan has been presented to and reviewed with the patient, Kelli Morales.  The patient has been given the opportunity to ask questions and make suggestions.  Harlow Basley L Butler-Nicholson, LPN 3/84/6659, 93:57 PM

## 2020-01-02 NOTE — ED Notes (Signed)
Patient transferred to room 3 from the ED< she is alert and oriented, no signs of distress.

## 2020-01-02 NOTE — ED Notes (Signed)
Patient is medically cleared per Dr. Creig Hines, patient is dressing out into the burgundy scrubs

## 2020-01-02 NOTE — ED Notes (Signed)
Spoke with [employer] [phone]), spoke with Magda Paganini, RN and informed her about patient. Magda Paganini stated make sure we get a tylenol, salicylate and iron level since she takes these medications at home. Magda Paganini also informed me that if she overdosed on Baclofen or Tylenol it should leave her body system in about 8 hrs since her arrival to ED.

## 2020-01-02 NOTE — Progress Notes (Signed)
Patient is 69 yr old female presenting in no acute distress under IVC to the unit.  She was alert and oriented X4. Initially upset at having to come  down to the unit. She was able to be calmed and participated fully in her assessment and signed all of her admission documents. Skin assessment  noted bilateral surgical scars on feet from bunion removal. Surgical scar across mid section from LAP procedure 30+yrs ago. Bilateral shoulder scars from rotator cuff surgery. Skin warm, dry and  clear of any abnormal marks.  No contraband on person or belongings. She was acclimated to the unit and  provided change of clothes and extra blanket. She remains safe on the unit with 15 minute safety checks and informed to contact staff with any questions

## 2020-01-02 NOTE — ED Notes (Signed)
PT  PLACED UNDER  IVC PAPERS  PER  DR  Hulan Saas  MD  INFORMED  GREG CHARGE NURSE  SECURITY  AND  JADEKA  RN

## 2020-01-03 DIAGNOSIS — F3163 Bipolar disorder, current episode mixed, severe, without psychotic features: Secondary | ICD-10-CM

## 2020-01-03 MED ORDER — CLONIDINE HCL 0.1 MG PO TABS
0.1000 mg | ORAL_TABLET | Freq: Once | ORAL | Status: AC
  Administered 2020-01-03: 0.1 mg via ORAL
  Filled 2020-01-03: qty 1

## 2020-01-03 MED ORDER — TOPIRAMATE 100 MG PO TABS
200.0000 mg | ORAL_TABLET | Freq: Every day | ORAL | Status: DC
  Administered 2020-01-03 – 2020-01-04 (×2): 200 mg via ORAL
  Filled 2020-01-03 (×2): qty 2

## 2020-01-03 MED ORDER — ZONISAMIDE 100 MG PO CAPS
300.0000 mg | ORAL_CAPSULE | Freq: Every day | ORAL | Status: DC
  Administered 2020-01-03 – 2020-01-04 (×2): 300 mg via ORAL
  Filled 2020-01-03 (×2): qty 3

## 2020-01-03 MED ORDER — BACLOFEN 10 MG PO TABS: 10.0000 mg | ORAL_TABLET | Freq: Three times a day (TID) | ORAL | Status: DC | PRN

## 2020-01-03 MED ORDER — VITAMIN D 25 MCG (1000 UNIT) PO TABS
1000.0000 [IU] | ORAL_TABLET | Freq: Every day | ORAL | Status: DC
  Administered 2020-01-03 – 2020-01-04 (×2): 1000 [IU] via ORAL
  Filled 2020-01-03 (×3): qty 1

## 2020-01-03 MED ORDER — ZIPRASIDONE HCL 40 MG PO CAPS
40.0000 mg | ORAL_CAPSULE | Freq: Two times a day (BID) | ORAL | Status: DC
  Administered 2020-01-03 – 2020-01-04 (×2): 40 mg via ORAL
  Filled 2020-01-03 (×2): qty 1

## 2020-01-03 MED ORDER — PANTOPRAZOLE SODIUM 40 MG PO TBEC
40.0000 mg | DELAYED_RELEASE_TABLET | Freq: Every day | ORAL | Status: DC
  Administered 2020-01-03 – 2020-01-04 (×2): 40 mg via ORAL
  Filled 2020-01-03 (×2): qty 1

## 2020-01-03 MED ORDER — OMEGA-3-ACID ETHYL ESTERS 1 G PO CAPS
1.0000 g | ORAL_CAPSULE | Freq: Every day | ORAL | Status: DC
  Administered 2020-01-03 – 2020-01-04 (×2): 1 g via ORAL
  Filled 2020-01-03 (×2): qty 1

## 2020-01-03 MED ORDER — ASPIRIN EC 81 MG PO TBEC
81.0000 mg | DELAYED_RELEASE_TABLET | Freq: Every day | ORAL | Status: DC
  Administered 2020-01-03 – 2020-01-04 (×2): 81 mg via ORAL
  Filled 2020-01-03 (×2): qty 1

## 2020-01-03 MED ORDER — ALBUTEROL SULFATE HFA 108 (90 BASE) MCG/ACT IN AERS
1.0000 | INHALATION_SPRAY | Freq: Four times a day (QID) | RESPIRATORY_TRACT | Status: DC | PRN
  Filled 2020-01-03: qty 6.7

## 2020-01-03 MED ORDER — AMLODIPINE BESYLATE 5 MG PO TABS
10.0000 mg | ORAL_TABLET | Freq: Every day | ORAL | Status: DC
  Administered 2020-01-03 – 2020-01-04 (×2): 10 mg via ORAL
  Filled 2020-01-03 (×2): qty 2

## 2020-01-03 MED ORDER — CLONAZEPAM 0.5 MG PO TABS
0.5000 mg | ORAL_TABLET | Freq: Three times a day (TID) | ORAL | Status: DC | PRN
  Administered 2020-01-03: 0.5 mg via ORAL
  Filled 2020-01-03: qty 1

## 2020-01-03 MED ORDER — TRAZODONE HCL 100 MG PO TABS
300.0000 mg | ORAL_TABLET | Freq: Every day | ORAL | Status: DC
  Administered 2020-01-03: 300 mg via ORAL
  Filled 2020-01-03: qty 3

## 2020-01-03 MED ORDER — POLYETHYLENE GLYCOL 3350 17 G PO PACK: 17.0000 g | PACK | Freq: Every day | ORAL | Status: DC | PRN

## 2020-01-03 MED ORDER — LAMOTRIGINE 100 MG PO TABS
400.0000 mg | ORAL_TABLET | Freq: Two times a day (BID) | ORAL | Status: DC
  Administered 2020-01-03 – 2020-01-04 (×3): 400 mg via ORAL
  Filled 2020-01-03 (×3): qty 4

## 2020-01-03 NOTE — [facility] Suicide Risk Assessment (Signed)
[place] INPATIENT:  Family/Significant Other Suicide Prevention Education  Suicide Prevention Education:  Education Completed; Arcenia Scarbro, daughter, [phone] has been identified by the patient as the family member/significant other with whom the patient will be residing, and identified as the person(s) who will aid the patient in the event of a mental health crisis (suicidal ideations/suicide attempt).  With written consent from the patient, the family member/significant other has been provided the following suicide prevention education, prior to the and/or following the discharge of the patient.  The suicide prevention education provided includes the following:  Suicide risk factors  Suicide prevention and interventions  National Suicide Hotline telephone number  [facility] assessment telephone number  [facility] Emergency Assistance [place] and/or Residential Mobile Crisis Unit telephone number  Request made of family/significant other to:  Remove weapons (e.g., guns, rifles, knives), all items previously/currently identified as safety concern.    Remove drugs/medications (over-the-counter, prescriptions, illicit drugs), all items previously/currently identified as a safety concern.  The family member/significant other verbalizes understanding of the suicide prevention education information provided.  The family member/significant other agrees to remove the items of safety concern listed above.  Daughter reports "from what she told me she is under suicide watch".  Daughter reports "I have always thought she was on too many medication and has some mental health issues."  She reports that "she has not been in the best state of mind in general".  She reports that "she's had fainting spells and sometimes it cam be like talking to a different person".  She reports that her mother has attempted in the past. She reports that patient is not a danger to self or  others.  She reports concerns that patients psychiatrist is not effective. She reports that she and the patient have a spotty relationship.  She reports "I am now on antidepressants because my mother has made me want to commit suicide.  She has had trauma in her life and now feels that she needs to cause trauma to others".  She reports that the patient's reports of being behind on rent and evicted are not true.  She reports that the patient has a history of being reckless with her money.  Rozann Lesches 01/03/2020, 3:45 PM

## 2020-01-03 NOTE — H&P (Signed)
Psychiatric Admission Assessment Adult  Patient Identification: Kelli Morales MRN:  [id number] Date of Evaluation:  01/03/2020 Chief Complaint:  Bipolar disorder (manic depression) ([place]) [F31.9] Principal Diagnosis: <principal problem not specified> Diagnosis:  Active Problems:   Bipolar disorder (manic depression) ([place])  History of Present Illness: Patient is seen and examined.  Patient is a 69 year old female with a past psychiatric history significant for bipolar disorder, migraine headaches, hypertension, COPD, and recent intentional overdose.  The patient stated that she had been under recent increased psychosocial stressors.  She had been helping care for her mother who is ill, and her mother died in 2022-10-09.  Unfortunately since then she had an increase in psychosocial stressors because of issues with regard to the patient's mother's Medicaid and other paperwork with regard to her death.  Reportedly the patient stop by the state patrol, and they said that she had probably taken too many medications because she was "driving crazy".  The patient stated she did not remember any of that.  She stated that she had not misused any of her medications.  She is followed by Dr. Clovis Pu at the [facility] outpatient clinic.  She is on significant degrees of anticonvulsant medication because of migraine headaches.  She was recently started on Geodon 20 mg p.o. twice daily.  She denied any problems with this.  Dr. Ebbie Latus last note from 6/24 revealed the patient had been taking Tegretol as well, but it stopped that secondary to weight gain.  Geodon was started in 10-09-22 of this year.  It stated that it was doing quite well, and that the patient was not near as "manic".  She has a chronic problem with sleep issues, and the note revealed that her sleep continues to be bad until Geodon was started.  She denied any suicidal ideation at that time.  She has previously been treated with Vraylar,  perphenazine, Abilify, lithium, Geodon, Depakote, Tegretol, Risperdal, Latuda, Lamictal, mirtazapine, paroxetine, Lexapro, sertraline, duloxetine.  The patient denied suicidal ideation.  She stated that she felt as though she was under enormous psychosocial stressors, but was requesting discharge already on admission.  She was admitted to the hospital for evaluation and stabilization.  Associated Signs/Symptoms: Depression Symptoms:  depressed mood, anhedonia, insomnia, psychomotor agitation, anxiety, disturbed sleep, (Hypo) Manic Symptoms:  Impulsivity, Labiality of Mood, Anxiety Symptoms:  Excessive Worry, Psychotic Symptoms:  Denied PTSD Symptoms: Negative Total Time spent with patient: 30 minutes  Past Psychiatric History: Patient has been diagnosed with bipolar disorder type I, bipolar disorder type II.  She is followed at [facility] behavioral health outpatient clinic by [place].  She has been on multiple medications in the past.  See list in HPI.  This is her first psychiatric hospitalization.  Is the patient at risk to self? Yes.    Has the patient been a risk to self in the past 6 months? No.  Has the patient been a risk to self within the distant past? No.  Is the patient a risk to others? No.  Has the patient been a risk to others in the past 6 months? No.  Has the patient been a risk to others within the distant past? No.   Prior Inpatient Therapy:   Prior Outpatient Therapy:    Alcohol Screening: 1. How often do you have a drink containing alcohol?: Never 2. How many drinks containing alcohol do you have on a typical day when you are drinking?: 1 or 2 3. How often  do you have six or more drinks on one occasion?: Never AUDIT-C Score: 0 4. How often during the last year have you found that you were not able to stop drinking once you had started?: Never 5. How often during the last year have you failed to do what was normally expected from you  because of drinking?: Never 6. How often during the last year have you needed a first drink in the morning to get yourself going after a heavy drinking session?: Never 7. How often during the last year have you had a feeling of guilt of remorse after drinking?: Never 8. How often during the last year have you been unable to remember what happened the night before because you had been drinking?: Never 9. Have you or someone else been injured as a result of your drinking?: No 10. Has a relative or friend or a doctor or another health worker been concerned about your drinking or suggested you cut down?: No Alcohol Use Disorder Identification Test Final Score (AUDIT): 0 Alcohol Brief Interventions/Follow-up: AUDIT Score <7 follow-up not indicated Substance Abuse History in the last 12 months:  No. Consequences of Substance Abuse: Negative Previous Psychotropic Medications: Yes  Psychological Evaluations: Yes  Past Medical History:  Past Medical History:  Diagnosis Date  . Anxiety   . Asthma   . Bipolar disorder ([facility])   . Cataracts, bilateral   . Depression   . GERD (gastroesophageal reflux disease)   . Hydatid cyst    Left  . Hypertension   . Kidney problem    RT kidney is smaller -> decreased output  . Migraine   . Osteoporosis 11/2016   T score -2.8  improved from prior DEXA  . Right rotator cuff tear   . Vertigo     Past Surgical History:  Procedure Laterality Date  . BUNIONECTOMY     L foot 07/2011 --RT foot 10/2011  . CATARACT EXTRACTION    . Fimbrioplasty-bilateral-Lysis of adhesions-Excision of Left ovarian cyst  1986  . Hydatid cyst     Left-Adhesions  . IR KYPHO EA ADDL LEVEL THORACIC OR LUMBAR  07/14/2018  . IR KYPHO LUMBAR INC FX REDUCE BONE BX UNI/BIL CANNULATION INC/IMAGING  07/14/2018  . IR RADIOLOGIST EVAL & MGMT  07/20/2018  . ROTATOR CUFF REPAIR     LEFT  . SHOULDER OPEN ROTATOR CUFF REPAIR Right 08/26/2012   Procedure: RIGHT SHOULDER MINI OPEN ROTATOR CUFF REPAIR  POSSIBLE PATCH GRAFT AND SUBACROMIAL DECOMPRESSION;  Surgeon: Johnn Hai, MD;  Location: [facility];  Service: Orthopedics;  Laterality: Right;   Family History:  Family History  Problem Relation Age of Onset  . Diabetes Father   . Hypertension Father   . Heart disease Father   . Stroke Father   . Cancer Father        Lymphoma  . Hypertension Mother   . Heart disease Mother   . Stroke Mother   . Stroke Paternal Uncle   . Heart disease Brother    Family Psychiatric  History: Denied Tobacco Screening:   Social History:  Social History   Substance and Sexual Activity  Alcohol Use Yes  . Alcohol/week: 0.0 standard drinks   Comment: Rare     Social History   Substance and Sexual Activity  Drug Use No    Additional Social History: Marital status: Divorced Divorced, when?: "last year" Additional relationship information: Pt reports that she is currently in a relationship. Does patient have children?: Yes How many  children?: 1 How is patient's relationship with their children?: Pt reports that hse has a daughter and the relationship is "very good".                         Allergies:   Allergies  Allergen Reactions  . Dilaudid [Hydromorphone Hcl] Other (See Comments)    Nightmares  . Divalproex Sodium     Became comatose when she took Depakote and Geodon together.  . Nsaids     "kidney problem"  . Perphenazine-Amitriptyline Itching  . Seroquel [Quetiapine Fumerate]    Lab Results:  Results for orders placed or performed during the hospital encounter of 01/02/20 (from the past 48 hour(s))  CBC with Differential     Status: None   Collection Time: 01/02/20 11:22 AM  Result Value Ref Range   WBC 6.0 4.0 - 10.5 K/uL   RBC 4.51 3.87 - 5.11 MIL/uL   Hemoglobin 13.6 12.0 - 15.0 g/dL   HCT 40.5 36 - 46 %   MCV 89.8 80.0 - 100.0 fL   MCH 30.2 26.0 - 34.0 pg   MCHC 33.6 30.0 - 36.0 g/dL   RDW 11.9 11.5 - 15.5 %   Platelets 210 150 - 400 K/uL   nRBC 0.0 0.0 -  0.2 %   Neutrophils Relative % 78 %   Neutro Abs 4.7 1.7 - 7.7 K/uL   Lymphocytes Relative 14 %   Lymphs Abs 0.8 0.7 - 4.0 K/uL   Monocytes Relative 6 %   Monocytes Absolute 0.3 0 - 1 K/uL   Eosinophils Relative 1 %   Eosinophils Absolute 0.1 0 - 0 K/uL   Basophils Relative 1 %   Basophils Absolute 0.0 0 - 0 K/uL   Immature Granulocytes 0 %   Abs Immature Granulocytes 0.01 0.00 - 0.07 K/uL    Comment: Performed at [facility], [address] [place], [place] [address]  Comprehensive metabolic panel     Status: Abnormal   Collection Time: 01/02/20 11:22 AM  Result Value Ref Range   Sodium 137 135 - 145 mmol/L   Potassium 3.8 3.5 - 5.1 mmol/L    Comment: HEMOLYSIS AT THIS LEVEL MAY AFFECT RESULT   Chloride 105 98 - 111 mmol/L   CO2 23 22 - 32 mmol/L   Glucose, Bld 114 (H) 70 - 99 mg/dL    Comment: Glucose reference range applies only to samples taken after fasting for at least 8 hours.   BUN 27 (H) 8 - 23 mg/dL   Creatinine, Ser 1.58 (H) 0.44 - 1.00 mg/dL   Calcium 9.3 8.9 - 10.3 mg/dL   Total Protein 8.4 (H) 6.5 - 8.1 g/dL   Albumin 5.0 3.5 - 5.0 g/dL   AST 28 15 - 41 U/L   ALT 19 0 - 44 U/L   Alkaline Phosphatase 91 38 - 126 U/L   Total Bilirubin 0.7 0.3 - 1.2 mg/dL   GFR calc non Af Amer 33 (L) >60 mL/min   GFR calc Af Amer 39 (L) >60 mL/min   Anion gap 9 5 - 15    Comment: Performed at [facility], [address] [place], [place] [address]  Blood gas, venous     Status: Abnormal   Collection Time: 01/02/20 11:22 AM  Result Value Ref Range   pH, Ven 7.05 (LL) 7.25 - 7.43    Comment: CRITICAL RESULT CALLED TO, READ BACK BY AND VERIFIED WITH:  Hulan Saas  MD AT 1210, 01/02/2020, LS    pCO2, Ven 67 (H) 44 - 60 mmHg   pO2, Ven 45.0 32 - 45 mmHg   Bicarbonate 18.5 (L) 20.0 - 28.0 mmol/L   Acid-base deficit 12.8 (H) 0.0 - 2.0 mmol/L   O2 Saturation 57.9 %   Patient temperature 37.0    Collection site VENOUS    Sample type VENOUS     Comment:  Performed at [facility], [address] [place], [place] [address]  Magnesium     Status: Abnormal   Collection Time: 01/02/20 11:22 AM  Result Value Ref Range   Magnesium 2.6 (H) 1.7 - 2.4 mg/dL    Comment: Performed at [facility], [address] [place], [place] [address]  SARS Coronavirus 2 by RT PCR (hospital order, performed in [facility] hospital lab) Nasopharyngeal Nasopharyngeal Swab     Status: None   Collection Time: 01/02/20 11:39 AM   Specimen: Nasopharyngeal Swab  Result Value Ref Range   SARS Coronavirus 2 NEGATIVE NEGATIVE    Comment: (NOTE) SARS-CoV-2 target nucleic acids are NOT DETECTED.  The SARS-CoV-2 RNA is generally detectable in upper and lower respiratory specimens during the acute phase of infection. The lowest concentration of SARS-CoV-2 viral copies this assay can detect is 250 copies / mL. A negative result does not preclude SARS-CoV-2 infection and should not be used as the sole basis for treatment or other patient management decisions.  A negative result may occur with improper specimen collection / handling, submission of specimen other than nasopharyngeal swab, presence of viral mutation(s) within the areas targeted by this assay, and inadequate number of viral copies (<250 copies / mL). A negative result must be combined with clinical observations, patient history, and epidemiological information.  Fact Sheet for Patients:   StrictlyIdeas.no  Fact Sheet for Healthcare Providers: BankingDealers.co.za  This test is not yet approved or  cleared by the [place] FDA and has been authorized for detection and/or diagnosis of SARS-CoV-2 by FDA under an Emergency Use Authorization (EUA).  This EUA will remain in effect (meaning this test can be used) for the duration of the COVID-19 declaration under Section 564(b)(1) of the Act, 21 U.S.C. section 360bbb-3(b)(1), unless the  authorization is terminated or revoked sooner.  Performed at [facility], [address] [place], [place] [address]   Acetaminophen level     Status: Abnormal   Collection Time: 01/02/20 11:53 AM  Result Value Ref Range   Acetaminophen (Tylenol), Serum <10 (L) 10 - 30 ug/mL    Comment: (NOTE) Therapeutic concentrations vary significantly. A range of 10-30 ug/mL  may be an effective concentration for many patients. However, some  are best treated at concentrations outside of this range. Acetaminophen concentrations >150 ug/mL at 4 hours after ingestion  and >50 ug/mL at 12 hours after ingestion are often associated with  toxic reactions.  Performed at [facility], [address] [place], [place] [address]   Ethanol     Status: None   Collection Time: 01/02/20 11:53 AM  Result Value Ref Range   Alcohol, Ethyl (B) <10 <10 mg/dL    Comment: (NOTE) Lowest detectable limit for serum alcohol is 10 mg/dL.  For medical purposes only. Performed at [facility], [place], [place] [address]   Salicylate level     Status: Abnormal   Collection Time: 01/02/20 11:53 AM  Result Value Ref Range   Salicylate Lvl <1.3 (L) 7.0 - 30.0  mg/dL    Comment: Performed at [facility], [address] [place], [place] [address]  Blood gas, venous     Status: Abnormal (Preliminary result)   Collection Time: 01/02/20  1:56 PM  Result Value Ref Range   pH, Ven 7.29 7.25 - 7.43   pCO2, Ven 44 44 - 60 mmHg   pO2, Ven PENDING 32 - 45 mmHg   Bicarbonate 21.2 20.0 - 28.0 mmol/L   Acid-base deficit 5.2 (H) 0.0 - 2.0 mmol/L   O2 Saturation 48.6 %   Patient temperature 37.0    Collection site VEIN    Sample type VENOUS     Comment: Performed at [facility], [address] [place], [place] [address]  Urine Drug Screen, Qualitative ([facility] only)     Status: None   Collection Time: 01/02/20  3:53 PM  Result Value Ref Range   Tricyclic,  Ur Screen NONE DETECTED NONE DETECTED   Amphetamines, Ur Screen NONE DETECTED NONE DETECTED   MDMA (Ecstasy)Ur Screen NONE DETECTED NONE DETECTED   Cocaine Metabolite,Ur [place] NONE DETECTED NONE DETECTED   Opiate, Ur Screen NONE DETECTED NONE DETECTED   Phencyclidine (PCP) Ur S NONE DETECTED NONE DETECTED   Cannabinoid 50 Ng, Ur [place] NONE DETECTED NONE DETECTED   Barbiturates, Ur Screen NONE DETECTED NONE DETECTED   Benzodiazepine, Ur Scrn NONE DETECTED NONE DETECTED   Methadone Scn, Ur NONE DETECTED NONE DETECTED    Comment: (NOTE) Tricyclics + metabolites, urine    Cutoff 1000 ng/mL Amphetamines + metabolites, urine  Cutoff 1000 ng/mL MDMA (Ecstasy), urine              Cutoff 500 ng/mL Cocaine Metabolite, urine          Cutoff 300 ng/mL Opiate + metabolites, urine        Cutoff 300 ng/mL Phencyclidine (PCP), urine         Cutoff 25 ng/mL Cannabinoid, urine                 Cutoff 50 ng/mL Barbiturates + metabolites, urine  Cutoff 200 ng/mL Benzodiazepine, urine              Cutoff 200 ng/mL Methadone, urine                   Cutoff 300 ng/mL  The urine drug screen provides only a preliminary, unconfirmed analytical test result and should not be used for non-medical purposes. Clinical consideration and professional judgment should be applied to any positive drug screen result due to possible interfering substances. A more specific alternate chemical method must be used in order to obtain a confirmed analytical result. Gas chromatography / mass spectrometry (GC/MS) is the preferred confirm atory method. Performed at [facility], [address] [place], [place] [address]     Blood Alcohol level:  Lab Results  Component Value Date   [facility] <10 24/40/1027    Metabolic Disorder Labs:  No results found for: HGBA1C, MPG No results found for: PROLACTIN No results found for: CHOL, TRIG, HDL, CHOLHDL, VLDL, LDLCALC  Current Medications: Current Facility-Administered  Medications  Medication Dose Route Frequency Provider Last Rate Last Admin  . acetaminophen (TYLENOL) tablet 650 mg  650 mg Oral Q6H PRN Eulas Post, MD      . albuterol (VENTOLIN HFA) 108 (90 Base) MCG/ACT inhaler 1-2 puff  1-2 puff Inhalation Q6H PRN Sharma Covert, MD      . alum & mag hydroxide-simeth (MAALOX/MYLANTA) [phone]  MG/5ML suspension 30 mL  30 mL Oral Q4H PRN Eulas Post, MD      . amLODipine (NORVASC) tablet 10 mg  10 mg Oral Daily Sharma Covert, MD   10 mg at 01/03/20 1113  . aspirin EC tablet 81 mg  81 mg Oral Daily Sharma Covert, MD   81 mg at 01/03/20 1113  . baclofen (LIORESAL) tablet 10 mg  10 mg Oral TID PRN Sharma Covert, MD      . cholecalciferol (VITAMIN D3) tablet 1,000 Units  1,000 Units Oral Daily Sharma Covert, MD   1,000 Units at 01/03/20 1116  . clonazePAM (KLONOPIN) tablet 0.5 mg  0.5 mg Oral TID PRN Sharma Covert, MD      . diphenhydrAMINE (BENADRYL) capsule 25 mg  25 mg Oral Q6H PRN Eulas Post, MD   25 mg at 01/02/20 2150  . lamoTRIgine (LAMICTAL) tablet 400 mg  400 mg Oral BID Sharma Covert, MD   400 mg at 01/03/20 1115  . magnesium hydroxide (MILK OF MAGNESIA) suspension 30 mL  30 mL Oral Daily PRN Eulas Post, MD      . omega-3 acid ethyl esters (LOVAZA) capsule 1 g  1 g Oral Daily Sharma Covert, MD   1 g at 01/03/20 1208  . pantoprazole (PROTONIX) EC tablet 40 mg  40 mg Oral Daily Sharma Covert, MD   40 mg at 01/03/20 1112  . polyethylene glycol (MIRALAX / GLYCOLAX) packet 17 g  17 g Oral Daily PRN Sharma Covert, MD      . topiramate (TOPAMAX) tablet 200 mg  200 mg Oral Daily Sharma Covert, MD   200 mg at 01/03/20 1112  . traZODone (DESYREL) tablet 300 mg  300 mg Oral QHS Sharma Covert, MD      . traZODone (DESYREL) tablet 50 mg  50 mg Oral QHS PRN Eulas Post, MD   50 mg at 01/02/20 2150  . ziprasidone (GEODON) capsule 40 mg  40 mg Oral BID WC Sharma Covert, MD       . zonisamide [facility]) capsule 300 mg  300 mg Oral Daily Sharma Covert, MD   300 mg at 01/03/20 1208   PTA Medications: Medications Prior to Admission  Medication Sig Dispense Refill Last Dose  . ACETAMINOPHEN 8 HOUR PO Take 650 mg by mouth as needed.     Marland Kitchen albuterol (VENTOLIN HFA) 108 (90 Base) MCG/ACT inhaler      . amLODipine (NORVASC) 5 MG tablet Take 5 mg by mouth daily.     . Ascorbic Acid (VITAMIN C PO) Take 1 tablet by mouth daily.     Marland Kitchen aspirin 81 MG tablet Take 325 mg by mouth 3 (three) times a week. Min, Wed, Fri     . baclofen (LIORESAL) 10 MG tablet Take 10 mg by mouth 3 (three) times daily.     Marland Kitchen CALCIUM PO Take 1 tablet by mouth daily.     . cholecalciferol (VITAMIN D) 1000 UNITS tablet Take 1,000 Units by mouth 2 (two) times daily.      . clonazePAM (KLONOPIN) 0.5 MG tablet Take 1 tablet (0.5 mg total) by mouth 3 (three) times daily as needed for anxiety (and insomnia). 270 tablet 0   . Cyanocobalamin (VITAMIN B 12 PO) Take by mouth.     . denosumab (PROLIA) 60 MG/ML SOLN injection Inject 60 mg into the skin every 6 (six) months. Administer in upper arm, thigh,  or abdomen     . fish oil-omega-3 fatty acids 1000 MG capsule Take 1 g by mouth daily.     . fluticasone (FLONASE) 50 MCG/ACT nasal spray Place 2 sprays into the nose daily.     . Fluticasone Furoate (ARNUITY ELLIPTA IN) Inhale 1 puff into the lungs at bedtime.      . Glycerin-Hypromellose-PEG 400 ([employer] DRY EYE RELIEF OP) Apply 1 drop to eye at bedtime as needed (dry eyes).     Marland Kitchen guaiFENesin (MUCINEX PO) Take 1 tablet by mouth as needed.      . IRON PO Take 1 tablet by mouth daily.     . L-FORMULA LYSINE HCL PO Take by mouth.     . lamoTRIgine (LAMICTAL) 200 MG tablet Take 2 tablets (400 mg total) by mouth 2 (two) times daily. 360 tablet 1   . magnesium gluconate (MAGONATE) 500 MG tablet Take 500 mg by mouth daily.     . meclizine (ANTIVERT) 12.5 MG tablet Take 1 tablet (12.5 mg total) by mouth 3 (three) times  daily as needed for dizziness. 30 tablet 0   . Multiple Vitamins-Minerals (COMPLETE MULTIVITAMIN/MINERAL PO) Take 1 tablet by mouth daily.     . NONFORMULARY OR COMPOUNDED ITEM Estradiol vaginal cream 0.02% insert 1 gram vaginally twice weekly 90 each 3   . pantoprazole (PROTONIX) 40 MG tablet Take 40 mg by mouth daily.     . phenylephrine (SUDAFED PE) 10 MG TABS tablet Take 10 mg by mouth every 4 (four) hours as needed.      . polyethylene glycol (MIRALAX / GLYCOLAX) packet Take 17 g by mouth 3 (three) times a week.     . Potassium 99 MG TABS Take by mouth.     . promethazine (PHENERGAN) 25 MG tablet Take 25 mg by mouth every 6 (six) hours as needed. Nausea     . topiramate (TOPAMAX) 200 MG tablet Take 1 tablet (200 mg total) by mouth daily. 90 tablet 1   . traMADol (ULTRAM) 50 MG tablet Take 1-2 tablets by mouth as needed.     . traZODone (DESYREL) 100 MG tablet TAKE 3 TABLETS BY MOUTH AT  BEDTIME 270 tablet 3   . ziprasidone (GEODON) 20 MG capsule Take 1 capsule (20 mg total) by mouth 2 (two) times daily with a meal. 60 capsule 2   . zonisamide (ZONEGRAN) 100 MG capsule Take 300 mg by mouth daily.        Musculoskeletal: Strength & Muscle Tone: within normal limits Gait & Station: normal Patient leans: N/A  Psychiatric Specialty Exam: Physical Exam Vitals and nursing note reviewed.  Constitutional:      Appearance: Normal appearance.  HENT:     Head: Normocephalic and atraumatic.  Pulmonary:     Effort: Pulmonary effort is normal.  Neurological:     General: No focal deficit present.     Mental Status: She is alert and oriented to person, place, and time.     Review of Systems  Blood pressure (!) 152/100, pulse 79, temperature 98 F (36.7 C), temperature source Oral, resp. rate 17, height 5\' 5"  (1.651 m), weight 65.8 kg, SpO2 100 %.Body mass index is 24.14 kg/m.  General Appearance: Casual  Eye Contact:  Fair  Speech:  Pressured  Volume:  Increased  Mood:  Anxious and  Depressed  Affect:  Congruent  Thought Process:  Coherent and Descriptions of Associations: Intact  Orientation:  Full (Time, Place, and Person)  Thought Content:  Logical  Suicidal Thoughts:  No  Homicidal Thoughts:  No  Memory:  Immediate;   Fair Recent;   Fair Remote;   Fair  Judgement:  Intact  Insight:  Fair  Psychomotor Activity:  Increased  Concentration:  Concentration: Fair and Attention Span: Fair  Recall:  [employer] of Knowledge:  Good  Language:  Good  Akathisia:  Negative  Handed:  Right  AIMS (if indicated):     Assets:  Desire for Improvement Financial Resources/Insurance Housing Resilience Social Support Talents/Skills Transportation Vocational/Educational  ADL's:  Intact  Cognition:  WNL  Sleep:  Number of Hours: 6    Treatment Plan Summary: Daily contact with patient to assess and evaluate symptoms and progress in treatment, Medication management and Plan : Patient is seen and examined.  Patient is a 69 year old female with the above-stated past psychiatric history was admitted after concern of an intentional overdose of medications.  She will be admitted to the hospital.  She will be integrated in the milieu.  She will be encouraged to attend groups.  Her medication regimen is quite complicated with regard to anticonvulsant medications.  Her Lamictal, Topamax and zonisamide will be restarted at their previous dosages.  Her amlodipine will be restarted at 10 mg p.o. daily.  Her coated aspirin a day, baclofen, cholecalciferol as well as her clonazepam will be restarted.  Her Geodon will be increased to 40 mg p.o. twice daily.  Review of her admission laboratories revealed a normal blood gas, her chemistries revealed a slightly increased glucose at 114, her creatinine was elevated at 1.58.  Her last in the computer was 1.27 and prior to that a year ago was 1.39.  Liver function enzymes were normal.  CBC was normal.  Her acetaminophen was less than 10, salicylate was  less than 7.  Her blood alcohol was less than 10, drug screen was completely negative.  EKG showed a sinus rhythm with a QTc interval of 460.  Her blood pressure this morning is elevated at 158/89.  It should be noted that she has not received any of her medications at that time, and those have been restarted including the amlodipine.  She is afebrile.  We will attempt to collect collateral information as well.  Observation Level/Precautions:  15 minute checks Seizure  Laboratory:  Chemistry Profile  Psychotherapy:    Medications:    Consultations:    Discharge Concerns:    Estimated LOS:  Other:     Physician Treatment Plan for Primary Diagnosis: <principal problem not specified> Long Term Goal(s): Improvement in symptoms so as ready for discharge  Short Term Goals: Ability to identify changes in lifestyle to reduce recurrence of condition will improve, Ability to verbalize feelings will improve, Ability to disclose and discuss suicidal ideas, Ability to demonstrate self-control will improve, Ability to identify and develop effective coping behaviors will improve, Ability to maintain clinical measurements within normal limits will improve and Compliance with prescribed medications will improve  Physician Treatment Plan for Secondary Diagnosis: Active Problems:   Bipolar disorder (manic depression) ([place])  Long Term Goal(s): Improvement in symptoms so as ready for discharge  Short Term Goals: Ability to identify changes in lifestyle to reduce recurrence of condition will improve, Ability to verbalize feelings will improve, Ability to disclose and discuss suicidal ideas, Ability to demonstrate self-control will improve, Ability to identify and develop effective coping behaviors will improve, Ability to maintain clinical measurements within normal limits will improve and Compliance with prescribed medications will improve  I certify that inpatient services furnished can reasonably be expected to  improve the patient's condition.    Sharma Covert, MD 7/27/20211:01 PM

## 2020-01-03 NOTE — BHH Suicide Risk Assessment (Signed)
[facility] Admission Suicide Risk Assessment   Nursing information obtained from:  Patient Demographic factors:  NA Current Mental Status:  NA Loss Factors:  NA Historical Factors:  NA Risk Reduction Factors:  NA  Total Time spent with patient: 30 minutes Principal Problem: <principal problem not specified> Diagnosis:  Active Problems:   Bipolar disorder (manic depression) (HCC)  Subjective Data: Patient is seen and examined.  Patient is a 69 year old female with a past psychiatric history significant for bipolar disorder, migraine headaches, hypertension, COPD, and recent intentional overdose.  The patient stated that she had been under recent increased psychosocial stressors.  She had been helping care for her mother who is ill, and her mother died in 2022-10-27.  Unfortunately since then she had an increase in psychosocial stressors because of issues with regard to the patient's mother's Medicaid and other paperwork with regard to her death.  Reportedly the patient stop by the state patrol, and they said that she had probably taken too many medications because she was "driving crazy".  The patient stated she did not remember any of that.  She stated that she had not misused any of her medications.  She is followed by Dr. Clovis Pu at the [facility] outpatient clinic.  She is on significant degrees of anticonvulsant medication because of migraine headaches.  She was recently started on Geodon 20 mg p.o. twice daily.  She denied any problems with this.  Dr. Ebbie Latus last note from 6/24 revealed the patient had been taking Tegretol as well, but it stopped that secondary to weight gain.  Geodon was started in 2022/10/27 of this year.  It stated that it was doing quite well, and that the patient was not near as "manic".  She has a chronic problem with sleep issues, and the note revealed that her sleep continues to be bad until Geodon was started.  She denied any suicidal ideation at that time.  She  has previously been treated with Vraylar, perphenazine, Abilify, lithium, Geodon, Depakote, Tegretol, Risperdal, Latuda, Lamictal, mirtazapine, paroxetine, Lexapro, sertraline, duloxetine.  The patient denied suicidal ideation.  She stated that she felt as though she was under enormous psychosocial stressors, but was requesting discharge already on admission.  She was admitted to the hospital for evaluation and stabilization.  Continued Clinical Symptoms:  Alcohol Use Disorder Identification Test Final Score (AUDIT): 0 The "Alcohol Use Disorders Identification Test", Guidelines for Use in Primary Care, Second Edition.  World Pharmacologist [facility]). Score between 0-7:  no or low risk or alcohol related problems. Score between 8-15:  moderate risk of alcohol related problems. Score between 16-19:  high risk of alcohol related problems. Score 20 or above:  warrants further diagnostic evaluation for alcohol dependence and treatment.   CLINICAL FACTORS:   Bipolar Disorder:   Mixed State   Musculoskeletal: Strength & Muscle Tone: within normal limits Gait & Station: normal Patient leans: N/A  Psychiatric Specialty Exam: Physical Exam Vitals and nursing note reviewed.  Constitutional:      Appearance: Normal appearance.  HENT:     Head: Normocephalic and atraumatic.  Pulmonary:     Effort: Pulmonary effort is normal.  Neurological:     General: No focal deficit present.     Mental Status: She is alert and oriented to person, place, and time.     Review of Systems  Blood pressure (!) 158/89, pulse 67, temperature 98 F (36.7 C), temperature source Oral, resp. rate 17, height 5\' 5"  (1.651 m), weight 65.8  kg, SpO2 100 %.Body mass index is 24.14 kg/m.  General Appearance: Casual  Eye Contact:  Fair  Speech:  Pressured  Volume:  Increased  Mood:  Anxious and Depressed  Affect:  Congruent  Thought Process:  Coherent and Descriptions of Associations: Intact  Orientation:  Full  (Time, Place, and Person)  Thought Content:  Logical  Suicidal Thoughts:  No  Homicidal Thoughts:  No  Memory:  Immediate;   Fair Recent;   Fair Remote;   Fair  Judgement:  Intact  Insight:  Fair  Psychomotor Activity:  Increased  Concentration:  Concentration: Fair and Attention Span: Fair  Recall:  [employer] of Knowledge:  Fair  Language:  Good  Akathisia:  Negative  Handed:  Right  AIMS (if indicated):     Assets:  Desire for Improvement Financial Resources/Insurance Housing Resilience Talents/Skills Transportation  ADL's:  Intact  Cognition:  WNL  Sleep:  Number of Hours: 6      COGNITIVE FEATURES THAT CONTRIBUTE TO RISK:  None    SUICIDE RISK:   Mild:  Suicidal ideation of limited frequency, intensity, duration, and specificity.  There are no identifiable plans, no associated intent, mild dysphoria and related symptoms, good self-control (both objective and subjective assessment), few other risk factors, and identifiable protective factors, including available and accessible social support.  PLAN OF CARE: Patient is seen and examined.  Patient is a 69 year old female with the above-stated past psychiatric history was admitted after concern of an intentional overdose of medications.  She will be admitted to the hospital.  She will be integrated in the milieu.  She will be encouraged to attend groups.  Her medication regimen is quite complicated with regard to anticonvulsant medications.  Her Lamictal, Topamax and zonisamide will be restarted at their previous dosages.  Her amlodipine will be restarted at 10 mg p.o. daily.  Her coated aspirin a day, baclofen, cholecalciferol as well as her clonazepam will be restarted.  Her Geodon will be increased to 40 mg p.o. twice daily.  Review of her admission laboratories revealed a normal blood gas, her chemistries revealed a slightly increased glucose at 114, her creatinine was elevated at 1.58.  Her last in the computer was 1.27 and  prior to that a year ago was 1.39.  Liver function enzymes were normal.  CBC was normal.  Her acetaminophen was less than 10, salicylate was less than 7.  Her blood alcohol was less than 10, drug screen was completely negative.  EKG showed a sinus rhythm with a QTc interval of 460.  Her blood pressure this morning is elevated at 158/89.  It should be noted that she has not received any of her medications at that time, and those have been restarted including the amlodipine.  She is afebrile.  We will attempt to collect collateral information as well.  I certify that inpatient services furnished can reasonably be expected to improve the patient's condition.   Sharma Covert, MD 01/03/2020, 10:01 AM

## 2020-01-03 NOTE — BHH Counselor (Signed)
Adult Comprehensive Assessment  Patient ID: Kelli Morales, female   DOB: 1950/07/14, 69 y.o.   MRN: [id number]  Information Source: Information source: Patient  Current Stressors:  Patient states their primary concerns and needs for treatment are:: Patient reports "I was on my way to work and I didn't sleep well, I was facing something with my mother's estate and will and got a bill from [place] for $189,000. I was just crying and missed my exit.  I ended up at some sort of campground and thought if I drive far enough I could get turned around but I didn't and then a state trooper was there". Patient states their goals for this hospitilization and ongoing recovery are:: "For them to listen to me and understand I have not intention to kill myself and others" Educational / Learning stressors: Pt denies. Employment / Job issues: Pt denies. Family Relationships: "my brothers drive me crazyPublishing copy / Lack of resources (include bankruptcy): Pt denies. Housing / Lack of housing: Pt denies. Physical health (include injuries & life threatening diseases): "asthma, high blood pressure" Social relationships: Pt denies. Substance abuse: Pt denies. Bereavement / Loss: Pt reports her mother recently passed.  Living/Environment/Situation:  Living Arrangements: Alone How long has patient lived in current situation?: "2 years" What is atmosphere in current home: Comfortable  Family History:  Marital status: Divorced Divorced, when?: "last year" Additional relationship information: Pt reports that she is currently in a relationship. Does patient have children?: Yes How many children?: 1 How is patient's relationship with their children?: Pt reports that hse has a daughter and the relationship is "very good".  Childhood History:  By whom was/is the patient raised?: Both parents Description of patient's relationship with caregiver when they were a child: "I had the life, it was great" Patient's  description of current relationship with people who raised him/her: Pt reports that both parents are deceased. How were you disciplined when you got in trouble as a child/adolescent?: "Oh you didn't get in trouble in [place]" Does patient have siblings?: Yes Number of Siblings: 3 Description of patient's current relationship with siblings: "My brothers get on my nerves but we recenlty found out that we have a 1/2 sister and we have been getting along well." Did patient suffer any verbal/emotional/physical/sexual abuse as a child?: No Did patient suffer from severe childhood neglect?: No Has patient ever been sexually abused/assaulted/raped as an adolescent or adult?: No Was the patient ever a victim of a crime or a disaster?: No Witnessed domestic violence?: No Has patient been affected by domestic violence as an adult?: No  Education:  Highest grade of school patient has completed: Masters Currently a Ship broker?: No Learning disability?: No  Employment/Work Situation:   Employment situation: Employed 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 If attempted suicide, did drugs/alcohol play a role in this?: No Alcohol/Substance Abuse Treatment Hx: Denies past history Has alcohol/substance abuse ever caused legal problems?: No  Social Support System:   Patient's Community Support System: Manufacturing engineer  System: "my daughter, 3 cousins, my half sister, my boo and my pastor and psychiatrist" Type of  faith/religion: Catholic How does patient's faith help to cope with current illness?: "I see my pstor 2x a month"  Leisure/Recreation:   Do You Have Hobbies?: Yes Leisure and Hobbies: "rose, gardening, cooking, interior design"  Strengths/Needs:   What is the patient's perception of their strengths?: "I'm a great communicator and I listen very carefully.  I am a Engineer, maintenance." Patient states these barriers may affect/interfere with their treatment: Pt denies. Patient states these barriers may affect their return to the community: Pt denies.  Discharge Plan:   Currently receiving community mental health services: Yes (From Whom) (Dr. Clovis Pu at [facility]) Patient states concerns and preferences for aftercare planning are: Pt reports that she would like to continue with her current provider. Patient states they will know when they are safe and ready for discharge when: "Because I say so" Does patient have access to transportation?: No Does patient have financial barriers related to discharge medications?: No Plan for no access to transportation at discharge: CSW will assess needs. Will patient be returning to same living situation after discharge?: Yes  Summary/Recommendations:   Summary and Recommendations (to be completed by the evaluator): Patient is a 69 year old female from [place], [place] ([place]).  She reports that she is currently employed as a Marine scientist and has [employer] She presents to the hospital following being stopped by highway patrol stopped at a stop light and not moving.  She has a primary diagnosis of Bipolar Disorder.  Recommendations include crisis stabilization, therapeutic milieu, encourage group attendance and participation, medication management for detox/mood stabilization and development of comprehensive mental wellness/sobriety plan.  Rozann Lesches. 01/03/2020

## 2020-01-03 NOTE — Progress Notes (Signed)
   01/03/20 1400  Clinical Encounter Type  Visited With Patient  Visit Type Initial;Spiritual support;Social support;Behavioral Health  Referral From Chaplain  Consult/Referral To Chaplain  Pt came to group today. Pt participated a lot today. Pt talked about her mother's death and how it is still effecting her. She also mention that she might be taking to many meds. Ch will follow-up with Pt.

## 2020-01-03 NOTE — Plan of Care (Signed)
Pt denies depression and rates anxiety 3/10. Pt denies SI, HI and AVH. Pt was educated on care plan and verbalizes understanding. Pt was encouraged to attend groups. Collier Bullock RN Problem: Education: Goal: Knowledge of [facility] Education information/materials will improve Outcome: Not Progressing Goal: Emotional status will improve Outcome: Not Progressing Goal: Mental status will improve Outcome: Not Progressing Goal: Verbalization of understanding the information provided will improve Outcome: Not Progressing   Problem: Activity: Goal: Interest or engagement in activities will improve Outcome: Progressing Goal: Sleeping patterns will improve Outcome: Progressing   Problem: Coping: Goal: Ability to verbalize frustrations and anger appropriately will improve Outcome: Not Progressing Goal: Ability to demonstrate self-control will improve Outcome: Not Progressing   Problem: Health Behavior/Discharge Planning: Goal: Identification of resources available to assist in meeting health care needs will improve Outcome: Not Progressing Goal: Compliance with treatment plan for underlying cause of condition will improve Outcome: Not Progressing   Problem: Physical Regulation: Goal: Ability to maintain clinical measurements within normal limits will improve Outcome: Not Progressing   Problem: Safety: Goal: Periods of time without injury will increase Outcome: Not Progressing

## 2020-01-03 NOTE — BHH Suicide Risk Assessment (Signed)
[name] INPATIENT:  Family/Significant Other Suicide Prevention Education  Suicide Prevention Education:  Contact Attempts: Kelli Morales, daughter, [phone] has been identified by the patient as the family member/significant other with whom the patient will be residing, and identified as the person(s) who will aid the patient in the event of a mental health crisis.  With written consent from the patient, two attempts were made to provide suicide prevention education, prior to and/or following the patient's discharge.  We were unsuccessful in providing suicide prevention education.  A suicide education pamphlet was given to the patient to share with family/significant other.  Date and time of first attempt:01/03/2020 at 3:36PM Date and time of second attempt: Second attempt is needed.   CSW left HIPAA compliant voicemail.  Kelli Morales 01/03/2020, 3:36 PM

## 2020-01-03 NOTE — Progress Notes (Signed)
Recreation Therapy Notes  Date: 01/03/2020  Time: 9:30 am   Location: Craft room     Behavioral response: N/A   Intervention Topic: Communication   Discussion/Intervention: Patient did not attend group.   Clinical Observations/Feedback:  Patient did not attend group.   Niya Behler LRT/CTRS        Marquin Patino 01/03/2020 12:40 PM

## 2020-01-04 DIAGNOSIS — F3163 Bipolar disorder, current episode mixed, severe, without psychotic features: Secondary | ICD-10-CM | POA: Diagnosis not present

## 2020-01-04 MED ORDER — AMLODIPINE BESYLATE 10 MG PO TABS: 10.0000 mg | ORAL_TABLET | Freq: Every day | ORAL | 0 refills | Status: AC

## 2020-01-04 MED ORDER — ZIPRASIDONE HCL 40 MG PO CAPS: 40.0000 mg | ORAL_CAPSULE | Freq: Two times a day (BID) | ORAL | 0 refills | Status: DC

## 2020-01-04 NOTE — BHH Suicide Risk Assessment (Signed)
[facility] Discharge Suicide Risk Assessment   Principal Problem: <principal problem not specified> Discharge Diagnoses: Active Problems:   Bipolar disorder (manic depression) ([place])   Total Time spent with patient: 15 minutes  Musculoskeletal: Strength & Muscle Tone: within normal limits Gait & Station: normal Patient leans: N/A  Psychiatric Specialty Exam: Review of Systems  All other systems reviewed and are negative.   Blood pressure 128/81, pulse 69, temperature 98.2 F (36.8 C), temperature source Oral, resp. rate 17, height 5\' 5"  (1.651 m), weight 65.8 kg, SpO2 98 %.Body mass index is 24.14 kg/m.  General Appearance: Casual  Eye Contact::  Good  Speech:  Normal Rate409  Volume:  Normal  Mood:  Euthymic  Affect:  Congruent  Thought Process:  Coherent and Descriptions of Associations: Intact  Orientation:  Full (Time, Place, and Person)  Thought Content:  Logical  Suicidal Thoughts:  No  Homicidal Thoughts:  No  Memory:  Immediate;   Fair Recent;   Fair Remote;   Fair  Judgement:  Intact  Insight:  Fair  Psychomotor Activity:  Normal  Concentration:  Good  Recall:  Good  Fund of Knowledge:Good  Language: Good  Akathisia:  Negative  Handed:  Right  AIMS (if indicated):     Assets:  Communication Skills Desire for Improvement Financial Resources/Insurance Housing Resilience Talents/Skills Transportation Vocational/Educational  Sleep:  Number of Hours: 6.5  Cognition: WNL  ADL's:  Intact   Mental Status Per Nursing Assessment::   On Admission:  NA  Demographic Factors:  Age 69 or older and Living alone  Loss Factors: NA  Historical Factors: Impulsivity  Risk Reduction Factors:   Employed and Positive therapeutic relationship  Continued Clinical Symptoms:  Bipolar Disorder:   Mixed State  Cognitive Features That Contribute To Risk:  None    Suicide Risk:  Minimal: No identifiable suicidal ideation.  Patients presenting with no risk factors but  with morbid ruminations; may be classified as minimal risk based on the severity of the depressive symptoms   Follow-up Information    [facility] Follow up on 01/18/2020.   Why: Please follow up with Dr. Clovis Pu for medication management on Wednesday, August 11th at 2:45pm.  This is an in person appointment. Contact information: [address], [place] [id number]              Plan Of Care/Follow-up recommendations:  Activity:  ad lib  Sharma Covert, MD 01/04/2020, 9:25 AM

## 2020-01-04 NOTE — Progress Notes (Signed)
Recreation Therapy Notes  Date: 01/04/2020  Time: 9:30 am  Location: Craft room   Behavioral response: Appropriate  Intervention Topic: Coping skills   Discussion/Intervention:  Group content on today was focused on coping skills. The group defined what coping skills are and when they normally use coping skills. Individuals described how they normally cope with thing and the coping skills they normally use. Patients expressed why it is important to cope with things and how not coping with things can affect you. The group participated in the intervention "My coping box" and made coping boxes while adding coping skills they could use in the future to the box. Clinical Observations/Feedback:  Patient came to group and identified her coping skills as exercising and planting a rose bush. Individual was social with peers and staff while participating in the intervention.  Tiant Peixoto LRT/CTRS        Hyman Crossan 01/04/2020 12:03 PM

## 2020-01-04 NOTE — Tx Team (Addendum)
Interdisciplinary Treatment and Diagnostic Plan Update  01/04/2020 Time of Session: 9am Kelli Morales MRN: [id number]  Principal Diagnosis: <principal problem not specified>  Secondary Diagnoses: Active Problems:   Bipolar disorder (manic depression) (HCC)   Current Medications:  Current Facility-Administered Medications  Medication Dose Route Frequency Provider Last Rate Last Admin  . acetaminophen (TYLENOL) tablet 650 mg  650 mg Oral Q6H PRN Eulas Post, MD      . albuterol (VENTOLIN HFA) 108 (90 Base) MCG/ACT inhaler 1-2 puff  1-2 puff Inhalation Q6H PRN Sharma Covert, MD      . alum & mag hydroxide-simeth (MAALOX/MYLANTA) 200-200-20 MG/5ML suspension 30 mL  30 mL Oral Q4H PRN Eulas Post, MD      . amLODipine (NORVASC) tablet 10 mg  10 mg Oral Daily Sharma Covert, MD   10 mg at 01/04/20 9563  . aspirin EC tablet 81 mg  81 mg Oral Daily Sharma Covert, MD   81 mg at 01/04/20 0809  . baclofen (LIORESAL) tablet 10 mg  10 mg Oral TID PRN Sharma Covert, MD      . cholecalciferol (VITAMIN D3) tablet 1,000 Units  1,000 Units Oral Daily Sharma Covert, MD   1,000 Units at 01/04/20 [phone]  . clonazePAM (KLONOPIN) tablet 0.5 mg  0.5 mg Oral TID PRN Sharma Covert, MD   0.5 mg at 01/03/20 2111  . diphenhydrAMINE (BENADRYL) capsule 25 mg  25 mg Oral Q6H PRN Eulas Post, MD   25 mg at 01/02/20 2150  . lamoTRIgine (LAMICTAL) tablet 400 mg  400 mg Oral BID Sharma Covert, MD   400 mg at 01/04/20 0809  . magnesium hydroxide (MILK OF MAGNESIA) suspension 30 mL  30 mL Oral Daily PRN Eulas Post, MD      . omega-3 acid ethyl esters (LOVAZA) capsule 1 g  1 g Oral Daily Sharma Covert, MD   1 g at 01/04/20 [phone]  . pantoprazole (PROTONIX) EC tablet 40 mg  40 mg Oral Daily Sharma Covert, MD   40 mg at 01/04/20 9518  . polyethylene glycol (MIRALAX / GLYCOLAX) packet 17 g  17 g Oral Daily PRN Sharma Covert, MD      . topiramate (TOPAMAX) tablet  200 mg  200 mg Oral Daily Sharma Covert, MD   200 mg at 01/04/20 8416  . traZODone (DESYREL) tablet 300 mg  300 mg Oral QHS Sharma Covert, MD   300 mg at 01/03/20 2112  . traZODone (DESYREL) tablet 50 mg  50 mg Oral QHS PRN Eulas Post, MD   50 mg at 01/02/20 2150  . ziprasidone (GEODON) capsule 40 mg  40 mg Oral BID WC Sharma Covert, MD   40 mg at 01/04/20 0811  . zonisamide (ZONEGRAN) capsule 300 mg  300 mg Oral Daily Sharma Covert, MD   300 mg at 01/04/20 6063   PTA Medications: Medications Prior to Admission  Medication Sig Dispense Refill Last Dose  . ACETAMINOPHEN 8 HOUR PO Take 650 mg by mouth as needed.     Marland Kitchen albuterol (VENTOLIN HFA) 108 (90 Base) MCG/ACT inhaler      . amLODipine (NORVASC) 5 MG tablet Take 5 mg by mouth daily.     . Ascorbic Acid (VITAMIN C PO) Take 1 tablet by mouth daily.     Marland Kitchen aspirin 81 MG tablet Take 325 mg by mouth 3 (three) times a week. Min, Wed, Fri     .  baclofen (LIORESAL) 10 MG tablet Take 10 mg by mouth 3 (three) times daily.     Marland Kitchen CALCIUM PO Take 1 tablet by mouth daily.     . cholecalciferol (VITAMIN D) 1000 UNITS tablet Take 1,000 Units by mouth 2 (two) times daily.      . clonazePAM (KLONOPIN) 0.5 MG tablet Take 1 tablet (0.5 mg total) by mouth 3 (three) times daily as needed for anxiety (and insomnia). 270 tablet 0   . Cyanocobalamin (VITAMIN B 12 PO) Take by mouth.     . denosumab (PROLIA) 60 MG/ML SOLN injection Inject 60 mg into the skin every 6 (six) months. Administer in upper arm, thigh, or abdomen     . fish oil-omega-3 fatty acids 1000 MG capsule Take 1 g by mouth daily.     . fluticasone (FLONASE) 50 MCG/ACT nasal spray Place 2 sprays into the nose daily.     . Fluticasone Furoate (ARNUITY ELLIPTA IN) Inhale 1 puff into the lungs at bedtime.      . Glycerin-Hypromellose-PEG 400 ([facility] DRY EYE RELIEF OP) Apply 1 drop to eye at bedtime as needed (dry eyes).     Marland Kitchen guaiFENesin (MUCINEX PO) Take 1 tablet by mouth as  needed.      . IRON PO Take 1 tablet by mouth daily.     . L-FORMULA LYSINE HCL PO Take by mouth.     . lamoTRIgine (LAMICTAL) 200 MG tablet Take 2 tablets (400 mg total) by mouth 2 (two) times daily. 360 tablet 1   . magnesium gluconate (MAGONATE) 500 MG tablet Take 500 mg by mouth daily.     . meclizine (ANTIVERT) 12.5 MG tablet Take 1 tablet (12.5 mg total) by mouth 3 (three) times daily as needed for dizziness. 30 tablet 0   . Multiple Vitamins-Minerals (COMPLETE MULTIVITAMIN/MINERAL PO) Take 1 tablet by mouth daily.     . NONFORMULARY OR COMPOUNDED ITEM Estradiol vaginal cream 0.02% insert 1 gram vaginally twice weekly 90 each 3   . pantoprazole (PROTONIX) 40 MG tablet Take 40 mg by mouth daily.     . phenylephrine (SUDAFED PE) 10 MG TABS tablet Take 10 mg by mouth every 4 (four) hours as needed.      . polyethylene glycol (MIRALAX / GLYCOLAX) packet Take 17 g by mouth 3 (three) times a week.     . Potassium 99 MG TABS Take by mouth.     . promethazine (PHENERGAN) 25 MG tablet Take 25 mg by mouth every 6 (six) hours as needed. Nausea     . topiramate (TOPAMAX) 200 MG tablet Take 1 tablet (200 mg total) by mouth daily. 90 tablet 1   . traMADol (ULTRAM) 50 MG tablet Take 1-2 tablets by mouth as needed.     . traZODone (DESYREL) 100 MG tablet TAKE 3 TABLETS BY MOUTH AT  BEDTIME 270 tablet 3   . ziprasidone (GEODON) 20 MG capsule Take 1 capsule (20 mg total) by mouth 2 (two) times daily with a meal. 60 capsule 2   . zonisamide (ZONEGRAN) 100 MG capsule Take 300 mg by mouth daily.        Patient Stressors: Other: Being hospitalized   Patient Strengths: Capable of independent living Communication skills General fund of knowledge Supportive family/friends Work skills  Treatment Modalities: Medication Management, Group therapy, Case management,  1 to 1 session with clinician, Psychoeducation, Recreational therapy.   Physician Treatment Plan for Primary Diagnosis: <principal problem not  specified> Long Term Goal(s): Improvement  in symptoms so as ready for discharge Improvement in symptoms so as ready for discharge   Short Term Goals: Ability to identify changes in lifestyle to reduce recurrence of condition will improve Ability to verbalize feelings will improve Ability to disclose and discuss suicidal ideas Ability to demonstrate self-control will improve Ability to identify and develop effective coping behaviors will improve Ability to maintain clinical measurements within normal limits will improve Compliance with prescribed medications will improve Ability to identify changes in lifestyle to reduce recurrence of condition will improve Ability to verbalize feelings will improve Ability to disclose and discuss suicidal ideas Ability to demonstrate self-control will improve Ability to identify and develop effective coping behaviors will improve Ability to maintain clinical measurements within normal limits will improve Compliance with prescribed medications will improve  Medication Management: Evaluate patient's response, side effects, and tolerance of medication regimen.  Therapeutic Interventions: 1 to 1 sessions, Unit Group sessions and Medication administration.  Evaluation of Outcomes: Adequate for Discharge  Physician Treatment Plan for Secondary Diagnosis: Active Problems:   Bipolar disorder (manic depression) ([place])  Long Term Goal(s): Improvement in symptoms so as ready for discharge Improvement in symptoms so as ready for discharge   Short Term Goals: Ability to identify changes in lifestyle to reduce recurrence of condition will improve Ability to verbalize feelings will improve Ability to disclose and discuss suicidal ideas Ability to demonstrate self-control will improve Ability to identify and develop effective coping behaviors will improve Ability to maintain clinical measurements within normal limits will improve Compliance with prescribed  medications will improve Ability to identify changes in lifestyle to reduce recurrence of condition will improve Ability to verbalize feelings will improve Ability to disclose and discuss suicidal ideas Ability to demonstrate self-control will improve Ability to identify and develop effective coping behaviors will improve Ability to maintain clinical measurements within normal limits will improve Compliance with prescribed medications will improve     Medication Management: Evaluate patient's response, side effects, and tolerance of medication regimen.  Therapeutic Interventions: 1 to 1 sessions, Unit Group sessions and Medication administration.  Evaluation of Outcomes: Adequate for Discharge   RN Treatment Plan for Primary Diagnosis: <principal problem not specified> Long Term Goal(s): Knowledge of disease and therapeutic regimen to maintain health will improve  Short Term Goals: Ability to participate in decision making will improve, Ability to verbalize feelings will improve, Ability to disclose and discuss suicidal ideas, Ability to identify and develop effective coping behaviors will improve and Compliance with prescribed medications will improve  Medication Management: RN will administer medications as ordered by provider, will assess and evaluate patient's response and provide education to patient for prescribed medication. RN will report any adverse and/or side effects to prescribing provider.  Therapeutic Interventions: 1 on 1 counseling sessions, Psychoeducation, Medication administration, Evaluate responses to treatment, Monitor vital signs and CBGs as ordered, Perform/monitor CIWA, COWS, AIMS and Fall Risk screenings as ordered, Perform wound care treatments as ordered.  Evaluation of Outcomes: Adequate for Discharge   LCSW Treatment Plan for Primary Diagnosis: <principal problem not specified> Long Term Goal(s): Safe transition to appropriate next level of care at  discharge, Engage patient in therapeutic group addressing interpersonal concerns.  Short Term Goals: Engage patient in aftercare planning with referrals and resources  Therapeutic Interventions: Assess for all discharge needs, 1 to 1 time with Social worker, Explore available resources and support systems, Assess for adequacy in community support network, Educate family and significant other(s) on suicide prevention, Complete Psychosocial Assessment, Interpersonal group  therapy.  Evaluation of Outcomes: Adequate for Discharge   Progress in Treatment: Attending groups: Yes. Participating in groups: Yes. Taking medication as prescribed: Yes. Toleration medication: Yes. Family/Significant other contact made: Yes, individual(s) contacted:  pt daughter Patient understands diagnosis: Yes. Discussing patient identified problems/goals with staff: Yes. Medical problems stabilized or resolved: Yes. Denies suicidal/homicidal ideation: Yes. Issues/concerns per patient self-inventory: No. Other: NA  New problem(s) identified: No, Describe:  None reported  New Short Term/Long Term Goal(s):Attend outpatient treatment, take medication as prescribed, develop and implement healthy coping methods  Patient Goals:  "Learn to work on myself"  Discharge Plan or Barriers: Pt will return home and follow up at [facility].  Reason for Continuation of Hospitalization: D/C today 01/04/20  Estimated Length of Stay:N/A  Recreational Therapy: Patient: N/A Patient Goal: Patient will engage in groups without prompting or encouragement from LRT x3 group sessions within 5 recreation therapy group sessions  Attendees: Patient:Kelli Morales 01/04/2020 11:28 AM  Physician: Myles Lipps 01/04/2020 11:28 AM  Nursing: Collier Bullock 01/04/2020 11:28 AM  RN Care Manager: 01/04/2020 11:28 AM  Social Worker: Sanjuana Kava 01/04/2020 11:28 AM  Recreational Therapist: Isaias Sakai Tyra Gural 01/04/2020 11:28 AM   Other:  01/04/2020 11:28 AM  Other:  01/04/2020 11:28 AM  Other: 01/04/2020 11:28 AM    Scribe for Treatment Team: Yvette Rack, LCSW 01/04/2020 11:28 AM

## 2020-01-04 NOTE — Plan of Care (Signed)
  Problem: Education: Goal: Knowledge of [facility] Education information/materials will improve Outcome: Progressing Goal: Emotional status will improve Outcome: Progressing Goal: Mental status will improve Outcome: Progressing Goal: Verbalization of understanding the information provided will improve Outcome: Progressing   Problem: Activity: Goal: Interest or engagement in activities will improve Outcome: Progressing Goal: Sleeping patterns will improve Outcome: Progressing   Problem: Coping: Goal: Ability to verbalize frustrations and anger appropriately will improve Outcome: Progressing Goal: Ability to demonstrate self-control will improve Outcome: Progressing   Problem: Health Behavior/Discharge Planning: Goal: Identification of resources available to assist in meeting health care needs will improve Outcome: Progressing Goal: Compliance with treatment plan for underlying cause of condition will improve Outcome: Progressing   Problem: Physical Regulation: Goal: Ability to maintain clinical measurements within normal limits will improve Outcome: Progressing   Problem: Safety: Goal: Periods of time without injury will increase Outcome: Progressing   

## 2020-01-04 NOTE — Progress Notes (Signed)
  [facility] Adult Case Management Discharge Plan :  Will you be returning to the same living situation after discharge:  Yes,  lives alone At discharge, do you have transportation home?: Yes,  pt reports cousin will pick up Do you have the ability to pay for your medications: Yes,  [employer]  Release of information consent forms completed and in the chart;  Patient's signature needed at discharge.  Patient to Follow up at:  Follow-up Information    [facility] Follow up on 01/18/2020.   Why: Please follow up with Dr. Clovis Pu for medication management on Wednesday, August 11th at 2:45pm.  This is an in person appointment. Contact information: [address], [address] [place] [address]              Next level of care provider has access to [place] and Suicide Prevention discussed: Yes,  Asley Montemurro, daughter     Has patient been referred to the Quitline?: N/A patient is not a smoker  Patient has been referred for addiction treatment: N/A  Yvette Rack, LCSW 01/04/2020, 10:01 AM

## 2020-01-04 NOTE — Plan of Care (Signed)
Pt denies denies depression, anxiety, SI, HI and AVH. Pt was educated on care plan and verbalizes understanding. Pt was encouraged to attend groups. Collier Bullock RN Problem: Education: Goal: Knowledge of [facility] Education information/materials will improve Outcome: Adequate for Discharge Goal: Emotional status will improve Outcome: Adequate for Discharge Goal: Mental status will improve Outcome: Adequate for Discharge Goal: Verbalization of understanding the information provided will improve Outcome: Adequate for Discharge   Problem: Activity: Goal: Interest or engagement in activities will improve Outcome: Adequate for Discharge Goal: Sleeping patterns will improve Outcome: Adequate for Discharge   Problem: Coping: Goal: Ability to verbalize frustrations and anger appropriately will improve Outcome: Adequate for Discharge Goal: Ability to demonstrate self-control will improve Outcome: Adequate for Discharge   Problem: Coping: Goal: Ability to verbalize frustrations and anger appropriately will improve Outcome: Adequate for Discharge Goal: Ability to demonstrate self-control will improve Outcome: Adequate for Discharge   Problem: Health Behavior/Discharge Planning: Goal: Identification of resources available to assist in meeting health care needs will improve Outcome: Adequate for Discharge Goal: Compliance with treatment plan for underlying cause of condition will improve Outcome: Adequate for Discharge   Problem: Physical Regulation: Goal: Ability to maintain clinical measurements within normal limits will improve Outcome: Adequate for Discharge   Problem: Safety: Goal: Periods of time without injury will increase Outcome: Adequate for Discharge

## 2020-01-04 NOTE — Discharge Summary (Signed)
Physician Discharge Summary Note  Patient:  Kelli Morales is an 69 y.o., female MRN:  [id number] DOB:  03-02-1951 Patient phone:  [phone] (home)  Patient address:   [place] [address],  Total Time spent with patient: 30 minutes  Date of Admission:  01/02/2020 Date of Discharge: 01/04/2020  Reason for Admission: Reported recent intentional overdose  Principal Problem: <principal problem not specified> Discharge Diagnoses: Active Problems:   Bipolar disorder (manic depression) (HCC)   Past Psychiatric History: Patient has a longstanding history of bipolar disorder.  She is followed in the outpatient clinic by Dr. Clovis Pu.  This is her first psychiatric hospitalization.  See admission H&P for additional information.  Past Medical History:  Past Medical History:  Diagnosis Date  . Anxiety   . Asthma   . Bipolar disorder ([place])   . Cataracts, bilateral   . Depression   . GERD (gastroesophageal reflux disease)   . Hydatid cyst    Left  . Hypertension   . Kidney problem    RT kidney is smaller -> decreased output  . Migraine   . Osteoporosis 11/2016   T score -2.8  improved from prior DEXA  . Right rotator cuff tear   . Vertigo     Past Surgical History:  Procedure Laterality Date  . BUNIONECTOMY     L foot 07/2011 --RT foot 10/2011  . CATARACT EXTRACTION    . Fimbrioplasty-bilateral-Lysis of adhesions-Excision of Left ovarian cyst  1986  . Hydatid cyst     Left-Adhesions  . IR KYPHO EA ADDL LEVEL THORACIC OR LUMBAR  07/14/2018  . IR KYPHO LUMBAR INC FX REDUCE BONE BX UNI/BIL CANNULATION INC/IMAGING  07/14/2018  . IR RADIOLOGIST EVAL & MGMT  07/20/2018  . ROTATOR CUFF REPAIR     LEFT  . SHOULDER OPEN ROTATOR CUFF REPAIR Right 08/26/2012   Procedure: RIGHT SHOULDER MINI OPEN ROTATOR CUFF REPAIR POSSIBLE PATCH GRAFT AND SUBACROMIAL DECOMPRESSION;  Surgeon: Johnn Hai, MD;  Location: [facility];  Service: Orthopedics;  Laterality: Right;    Family History:  Family History  Problem Relation Age of Onset  . Diabetes Father   . Hypertension Father   . Heart disease Father   . Stroke Father   . Cancer Father        Lymphoma  . Hypertension Mother   . Heart disease Mother   . Stroke Mother   . Stroke Paternal Uncle   . Heart disease Brother    Family Psychiatric  History: See admission H&P Social History:  Social History   Substance and Sexual Activity  Alcohol Use Yes  . Alcohol/week: 0.0 standard drinks   Comment: Rare     Social History   Substance and Sexual Activity  Drug Use No    Social History   Socioeconomic History  . Marital status: Divorced    Spouse name: Not on file  . Number of children: 1  . Years of education: MSN  . Highest education level: Not on file  Occupational History  . Not on file  Tobacco Use  . Smoking status: Never Smoker  . Smokeless tobacco: Never Used  Vaping Use  . Vaping Use: Never used  Substance and Sexual Activity  . Alcohol use: Yes    Alcohol/week: 0.0 standard drinks    Comment: Rare  . Drug use: No  . Sexual activity: Not Currently    Birth control/protection: Post-menopausal    Comment: 1st intercourse 18 yo-2  partners  Other Topics Concern  . Not on file  Social History Narrative   Lives with husband and daughter   Caffeine use: 2 cups per day   Right handed   Social Determinants of Health   Financial Resource Strain:   . Difficulty of Paying Living Expenses:   Food Insecurity:   . Worried About Charity fundraiser in the Last Year:   . Arboriculturist in the Last Year:   Transportation Needs:   . Film/video editor (Medical):   Marland Kitchen Lack of Transportation (Non-Medical):   Physical Activity:   . Days of Exercise per Week:   . Minutes of Exercise per Session:   Stress:   . Feeling of Stress :   Social Connections:   . Frequency of Communication with Friends and Family:   . Frequency of Social Gatherings with Friends and Family:   .  Attends Religious Services:   . Active Member of Clubs or Organizations:   . Attends Archivist Meetings:   Marland Kitchen Marital Status:     Hospital Course: Patient is a 69 year old female with a past psychiatric history significant for bipolar disorder, migraine headaches, hypertension, COPD and reported recent intentional overdose.  Patient stated she had been under increased recent psychosocial stressors secondary to caring for her mother who was ill, and eventually died in 2022-10-29.  She is also been under psychosocial stress because of all the paperwork issues regarding her mother's death.  She was apparently recommended to come to the [facility] emergency department after the police found her driving erratically.  She stated that she had taken her medications, and these included trazodone and Klonopin at bedtime.  She is also on relatively high doses of anticonvulsant medication for migraine headache prevention.  She was admitted to the hospital.  She stated she had most recently started on Geodon.  She was only taking 20 mg p.o. twice daily.  She felt as though it was helping to a degree, and denied any side effects to it.  On admission she already was requesting discharge.  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  She was monitored overnight.  Her Geodon was increased to 40 mg p.o. twice daily.  None of her other medications were changed.  On the morning of 01/04/2020 she denied any auditory or visual hallucinations.  She denied any suicidal or homicidal ideation.  Her mood was good.  It was side she can be discharged home.  Physical Findings: AIMS:  , ,  ,  ,    CIWA:    COWS:     Musculoskeletal: Strength & Muscle Tone: within normal limits Gait & Station: normal Patient leans: N/A  Psychiatric Specialty Exam: Physical Exam Vitals and nursing note reviewed.  HENT:     Head: Normocephalic and atraumatic.  Pulmonary:     Effort: Pulmonary effort is  normal.  Neurological:     General: No focal deficit present.     Mental Status: She is oriented to person, place, and time.     Review of Systems  Blood pressure 128/81, pulse 69, temperature 98.2 F (36.8 C), temperature source Oral, resp. rate 17, height 5\' 5"  (1.651 m), weight 65.8 kg, SpO2 98 %.Body mass index is 24.14 kg/m.  General Appearance: Casual  Eye Contact:  Good  Speech:  Normal Rate  Volume:  Normal  Mood:  Euthymic  Affect:  Congruent  Thought Process:  Coherent and Descriptions  of Associations: Intact  Orientation:  Full (Time, Place, and Person)  Thought Content:  Logical  Suicidal Thoughts:  No  Homicidal Thoughts:  No  Memory:  Immediate;   Fair Recent;   Fair Remote;   Fair  Judgement:  Intact  Insight:  Fair  Psychomotor Activity:  Increased  Concentration:  Concentration: Fair and Attention Span: Fair  Recall:  [employer] of Knowledge:  Good  Language:  Good  Akathisia:  Negative  Handed:  Right  AIMS (if indicated):     Assets:  Communication Skills Desire for Improvement Financial Resources/Insurance Housing Resilience Social Support Talents/Skills Transportation Vocational/Educational  ADL's:  Intact  Cognition:  WNL  Sleep:  Number of Hours: 6.5        Has this patient used any form of tobacco in the last 30 days? (Cigarettes, Smokeless Tobacco, Cigars, and/or Pipes) Yes, No  Blood Alcohol level:  Lab Results  Component Value Date   ETH <10 37/90/2409    Metabolic Disorder Labs:  No results found for: HGBA1C, MPG No results found for: PROLACTIN No results found for: CHOL, TRIG, HDL, CHOLHDL, VLDL, LDLCALC  See Psychiatric Specialty Exam and Suicide Risk Assessment completed by Attending Physician prior to discharge.  Discharge destination:  Home  Is patient on multiple antipsychotic therapies at discharge:  No   Has Patient had three or more failed trials of antipsychotic monotherapy by history:  No  Recommended Plan  for Multiple Antipsychotic Therapies: NA  Discharge Instructions    Diet - low sodium heart healthy   Complete by: As directed    Increase activity slowly   Complete by: As directed    Increase activity slowly   Complete by: As directed      Allergies as of 01/04/2020      Reactions   Dilaudid [hydromorphone Hcl] Other (See Comments)   Nightmares   Divalproex Sodium    Became comatose when she took Depakote and Geodon together.   Nsaids    "kidney problem"   Perphenazine-amitriptyline Itching   Seroquel [quetiapine Fumerate]       Medication List    STOP taking these medications   ACETAMINOPHEN 8 HOUR PO     TAKE these medications     Indication  albuterol 108 (90 Base) MCG/ACT inhaler Commonly known as: VENTOLIN HFA  Indication: Chronic Obstructive Lung Disease   amLODipine 10 MG tablet Commonly known as: NORVASC Take 1 tablet (10 mg total) by mouth daily. Start taking on: January 05, 2020 What changed:   medication strength  how much to take  Indication: High Blood Pressure Disorder   ARNUITY ELLIPTA IN Inhale 1 puff into the lungs at bedtime.  Indication: COPD   aspirin 81 MG tablet Take 325 mg by mouth 3 (three) times a week. Min, Wed, Fri  Indication: Patent Ductus Arteriosus   baclofen 10 MG tablet Commonly known as: LIORESAL Take 10 mg by mouth 3 (three) times daily.  Indication: Muscle Spasm   CALCIUM PO Take 1 tablet by mouth daily.  Indication: nutritional supplement   cholecalciferol 1000 units tablet Commonly known as: VITAMIN D Take 1,000 Units by mouth 2 (two) times daily.  Indication: Vitamin D Deficiency   clonazePAM 0.5 MG tablet Commonly known as: KLONOPIN Take 1 tablet (0.5 mg total) by mouth 3 (three) times daily as needed for anxiety (and insomnia).  Indication: Manic-Depression   COMPLETE MULTIVITAMIN/MINERAL PO Take 1 tablet by mouth daily.  Indication: nutritional supplement   [employer] DRY  EYE RELIEF OP Apply 1 drop to eye  at bedtime as needed (dry eyes).  Indication: dry eyes   denosumab 60 MG/ML Soln injection Commonly known as: PROLIA Inject 60 mg into the skin every 6 (six) months. Administer in upper arm, thigh, or abdomen  Indication: Osteoporosis   fish oil-omega-3 fatty acids 1000 MG capsule Take 1 g by mouth daily.  Indication: nutritional supplement   fluticasone 50 MCG/ACT nasal spray Commonly known as: FLONASE Place 2 sprays into the nose daily.  Indication: Allergic Rhinitis   IRON PO Take 1 tablet by mouth daily.  Indication: nutritional supplement   L-FORMULA LYSINE HCL PO Take by mouth.  Indication: Nutritional supplement   lamoTRIgine 200 MG tablet Commonly known as: LAMICTAL Take 2 tablets (400 mg total) by mouth 2 (two) times daily.  Indication: Depressive Phase of Manic-Depression   magnesium gluconate 500 MG tablet Commonly known as: MAGONATE Take 500 mg by mouth daily.  Indication: nutritional supplement   meclizine 12.5 MG tablet Commonly known as: ANTIVERT Take 1 tablet (12.5 mg total) by mouth 3 (three) times daily as needed for dizziness.  Indication: Motion Sickness   MUCINEX PO Take 1 tablet by mouth as needed.  Indication: cough   NONFORMULARY OR COMPOUNDED ITEM Estradiol vaginal cream 0.02% insert 1 gram vaginally twice weekly  Indication: menopause   pantoprazole 40 MG tablet Commonly known as: PROTONIX Take 40 mg by mouth daily.  Indication: Gastroesophageal Reflux Disease   phenylephrine 10 MG Tabs tablet Commonly known as: SUDAFED PE Take 10 mg by mouth every 4 (four) hours as needed.  Indication: Stuffy Nose   polyethylene glycol 17 g packet Commonly known as: MIRALAX / GLYCOLAX Take 17 g by mouth 3 (three) times a week.  Indication: Constipation   Potassium 99 MG Tabs Take by mouth.  Indication: nutritional supplement   promethazine 25 MG tablet Commonly known as: PHENERGAN Take 25 mg by mouth every 6 (six) hours as needed.  Nausea  Indication: Nausea and Vomiting   topiramate 200 MG tablet Commonly known as: TOPAMAX Take 1 tablet (200 mg total) by mouth daily.  Indication: Migraine Headache   traMADol 50 MG tablet Commonly known as: ULTRAM Take 1-2 tablets by mouth as needed.  Indication: Pain   traZODone 100 MG tablet Commonly known as: DESYREL TAKE 3 TABLETS BY MOUTH AT  BEDTIME  Indication: Trouble Sleeping   VITAMIN B 12 PO Take by mouth.  Indication: B 12 deficiency   VITAMIN C PO Take 1 tablet by mouth daily.  Indication: nutritional supplement   ziprasidone 40 MG capsule Commonly known as: GEODON Take 1 capsule (40 mg total) by mouth 2 (two) times daily with a meal. What changed:   medication strength  how much to take  Indication: Manic-Depression   zonisamide 100 MG capsule Commonly known as: ZONEGRAN Take 300 mg by mouth daily.  Indication: Antipsychotic Therapy-Induced Weight Gain       Follow-up Information    [facility] Follow up on 01/18/2020.   Why: Please follow up with Dr. Clovis Pu for medication management on Wednesday, August 11th at 2:45pm.  This is an in person appointment. Contact information: [address], [place] [address]              Follow-up recommendations:  Activity:  ad lib  Comments: Follow-up with neurology given 3 anticonvulsants for migraine headache prevention, and possibility of cognitive dysfunction from these medications.  Signed: Sharma Covert,  MD 01/04/2020, 12:06 PM

## 2020-01-04 NOTE — Progress Notes (Signed)
Patient is pleasant and easy to engage.  She is active this evening interacting with peers on the unit. She denies SI/HI/AVH depression and anxiety on this encounter. She received her prescribed meds and tolerated without  Incident. She remains safe with 15 minute safety checks and informed to contact staff with any concerns.    Cleo Butler-Nicholson, LPN

## 2020-01-04 NOTE — Progress Notes (Signed)
Pt denies SI, HI and AVH. Pt was educated on dc plan and verbalizes understanding.Pt received belongings and dc packet. Bobbi Kozakiewicz RN 

## 2020-01-05 ENCOUNTER — Ambulatory Visit: Payer: Medicare Other | Admitting: Psychiatry

## 2020-01-05 LAB — BLOOD GAS, VENOUS
Acid-base deficit: 5.2 mmol/L — ABNORMAL HIGH (ref 0.0–2.0)
Bicarbonate: 21.2 mmol/L (ref 20.0–28.0)
O2 Saturation: 48.6 %
Patient temperature: 37
pCO2, Ven: 44 mmHg (ref 44.0–60.0)
pH, Ven: 7.29 (ref 7.250–7.430)

## 2020-01-18 ENCOUNTER — Encounter: Payer: Self-pay | Admitting: Psychiatry

## 2020-01-18 ENCOUNTER — Ambulatory Visit (INDEPENDENT_AMBULATORY_CARE_PROVIDER_SITE_OTHER): Payer: Medicare Other | Admitting: Psychiatry

## 2020-01-18 ENCOUNTER — Other Ambulatory Visit: Payer: Self-pay

## 2020-01-18 DIAGNOSIS — F5105 Insomnia due to other mental disorder: Secondary | ICD-10-CM | POA: Diagnosis not present

## 2020-01-18 DIAGNOSIS — F411 Generalized anxiety disorder: Secondary | ICD-10-CM | POA: Diagnosis not present

## 2020-01-18 DIAGNOSIS — G43009 Migraine without aura, not intractable, without status migrainosus: Secondary | ICD-10-CM | POA: Diagnosis not present

## 2020-01-18 DIAGNOSIS — F3181 Bipolar II disorder: Secondary | ICD-10-CM | POA: Diagnosis not present

## 2020-01-18 NOTE — Progress Notes (Signed)
Kelli Morales [id number] 1950-09-04 69 y.o.  Subjective:   Patient ID:  Kelli Morales is a 69 y.o. (DOB 28-Apr-1951) female.  Chief Complaint:  Chief Complaint  Patient presents with  . Follow-up  . Depression  . Anxiety  . Stress    M died in 10-03-2019 & hospitalization    HPI Kelli Morales presents to the office today for follow-up of bipolar disorder.  Recent hospitalization July 2021 for alleged intentional overdose of medications.  Discharged 01/02/2020 on clonazepam 0.5 mg 3 times daily as needed, lamotrigine 400 mg twice daily, topiramate 200 mg nightly, trazodone 300 mg nightly, and ziprasidone 40 mg twice daily, and zonisamide 300 mg daily.  12/01/2019 appointment notable for patient having seen significant mood stability and reduction of manic symptoms and depression with an increase in lamotrigine and the addition of Geodon 20 mg twice daily.  No meds were changed.  01/18/20 appt with the following noted: Disc recent hospitalization.  Said she felt weak the day of hospitalization and grief and got distraught.  She denied over taking meds and got pulled over by police for driving erratically.   She denies intentional overdose but she was dehydrated. Upset over being at behavioral health hosp of 2 days. Lamotrigine reduced to 1 daily. Reduced Zonesimide gradually per MD. Reduced topiramate to 100 mg daily. Says she ate normally that day.   Says she's always been sensitive to Geodon but is doing OK with it right now.  No other explanation for what happened that day except she was distraught and overwhelmed with mother's estate. Started grief therapy at Hospice.   Past Psychiatric Medication Trials: Vraylar early weight gain.  Perphenazine 8 mg, quetiapine 300 mg, Abilify 10, lithium, Geodon, Depakote, CBZ remotely but this is uncertain,  Risperidone, latuda 5# weight gain, CBZ 300 sleepiness. Lamotrigine 300  Mirtazapine, paroxetine, Lexapro, sertraline, duloxetine  120,   Review of Systems:  Review of Systems  Cardiovascular: Negative for palpitations.  Neurological: Negative for dizziness, tremors, weakness and light-headedness.    Medications: I have reviewed the patient's current medications.  Current Outpatient Medications  Medication Sig Dispense Refill  . albuterol (VENTOLIN HFA) 108 (90 Base) MCG/ACT inhaler     . amLODipine (NORVASC) 10 MG tablet Take 1 tablet (10 mg total) by mouth daily. 30 tablet 0  . Ascorbic Acid (VITAMIN C PO) Take 1 tablet by mouth daily.    Marland Kitchen aspirin 81 MG tablet Take 325 mg by mouth 3 (three) times a week. Min, Wed, Fri    . baclofen (LIORESAL) 10 MG tablet Take 10 mg by mouth 3 (three) times daily.    Marland Kitchen CALCIUM PO Take 1 tablet by mouth daily.    . cholecalciferol (VITAMIN D) 1000 UNITS tablet Take 1,000 Units by mouth 2 (two) times daily.     . clonazePAM (KLONOPIN) 0.5 MG tablet Take 1 tablet (0.5 mg total) by mouth 3 (three) times daily as needed for anxiety (and insomnia). 270 tablet 0  . Cyanocobalamin (VITAMIN B 12 PO) Take by mouth.    . denosumab (PROLIA) 60 MG/ML SOLN injection Inject 60 mg into the skin every 6 (six) months. Administer in upper arm, thigh, or abdomen    . fish oil-omega-3 fatty acids 1000 MG capsule Take 1 g by mouth daily.    . fluticasone (FLONASE) 50 MCG/ACT nasal spray Place 2 sprays into the nose daily.    . Fluticasone Furoate (ARNUITY ELLIPTA IN) Inhale 1 puff into the lungs  at bedtime.     . Glycerin-Hypromellose-PEG 400 ([facility] DRY EYE RELIEF OP) Apply 1 drop to eye at bedtime as needed (dry eyes).    Marland Kitchen guaiFENesin (MUCINEX PO) Take 1 tablet by mouth as needed.     . IRON PO Take 1 tablet by mouth daily.    . L-FORMULA LYSINE HCL PO Take by mouth.    . lamoTRIgine (LAMICTAL) 200 MG tablet Take 2 tablets (400 mg total) by mouth 2 (two) times daily. 360 tablet 1  . magnesium gluconate (MAGONATE) 500 MG tablet Take 500 mg by mouth daily.    . meclizine (ANTIVERT) 12.5 MG tablet Take  1 tablet (12.5 mg total) by mouth 3 (three) times daily as needed for dizziness. 30 tablet 0  . Multiple Vitamins-Minerals (COMPLETE MULTIVITAMIN/MINERAL PO) Take 1 tablet by mouth daily.    . NONFORMULARY OR COMPOUNDED ITEM Estradiol vaginal cream 0.02% insert 1 gram vaginally twice weekly 90 each 3  . pantoprazole (PROTONIX) 40 MG tablet Take 40 mg by mouth daily.    . phenylephrine (SUDAFED PE) 10 MG TABS tablet Take 10 mg by mouth every 4 (four) hours as needed.     . polyethylene glycol (MIRALAX / GLYCOLAX) packet Take 17 g by mouth 3 (three) times a week.    . Potassium 99 MG TABS Take by mouth.    . promethazine (PHENERGAN) 25 MG tablet Take 25 mg by mouth every 6 (six) hours as needed. Nausea    . topiramate (TOPAMAX) 200 MG tablet Take 1 tablet (200 mg total) by mouth daily. 90 tablet 1  . traMADol (ULTRAM) 50 MG tablet Take 1-2 tablets by mouth as needed.    . traZODone (DESYREL) 100 MG tablet TAKE 3 TABLETS BY MOUTH AT  BEDTIME 270 tablet 3  . ziprasidone (GEODON) 40 MG capsule Take 1 capsule (40 mg total) by mouth 2 (two) times daily with a meal. 60 capsule 0  . zonisamide (ZONEGRAN) 100 MG capsule Take 300 mg by mouth daily.      No current facility-administered medications for this visit.    Medication Side Effects: None  Allergies:  Allergies  Allergen Reactions  . Dilaudid [Hydromorphone Hcl] Other (See Comments)    Nightmares  . Divalproex Sodium     Became comatose when she took Depakote and Geodon together.  . Nsaids     "kidney problem"  . Perphenazine-Amitriptyline Itching  . Seroquel [Quetiapine Fumerate]     Past Medical History:  Diagnosis Date  . Anxiety   . Asthma   . Bipolar disorder ([place])   . Cataracts, bilateral   . Depression   . GERD (gastroesophageal reflux disease)   . Hydatid cyst    Left  . Hypertension   . Kidney problem    RT kidney is smaller -> decreased output  . Migraine   . Osteoporosis 11/2016   T score -2.8  improved from  prior DEXA  . Right rotator cuff tear   . Vertigo     Family History  Problem Relation Age of Onset  . Diabetes Father   . Hypertension Father   . Heart disease Father   . Stroke Father   . Cancer Father        Lymphoma  . Hypertension Mother   . Heart disease Mother   . Stroke Mother   . Stroke Paternal Uncle   . Heart disease Brother     Social History   Socioeconomic History  . Marital status: Divorced  Spouse name: Not on file  . Number of children: 1  . Years of education: MSN  . Highest education level: Not on file  Occupational History  . Not on file  Tobacco Use  . Smoking status: Never Smoker  . Smokeless tobacco: Never Used  Vaping Use  . Vaping Use: Never used  Substance and Sexual Activity  . Alcohol use: Yes    Alcohol/week: 0.0 standard drinks    Comment: Rare  . Drug use: No  . Sexual activity: Not Currently    Birth control/protection: Post-menopausal    Comment: 1st intercourse 22 yo-2 partners  Other Topics Concern  . Not on file  Social History Narrative   Lives with husband and daughter   Caffeine use: 2 cups per day   Right handed   Social Determinants of Health   Financial Resource Strain:   . Difficulty of Paying Living Expenses:   Food Insecurity:   . Worried About Charity fundraiser in the Last Year:   . Arboriculturist in the Last Year:   Transportation Needs:   . Film/video editor (Medical):   Marland Kitchen Lack of Transportation (Non-Medical):   Physical Activity:   . Days of Exercise per Week:   . Minutes of Exercise per Session:   Stress:   . Feeling of Stress :   Social Connections:   . Frequency of Communication with Friends and Family:   . Frequency of Social Gatherings with Friends and Family:   . Attends Religious Services:   . Active Member of Clubs or Organizations:   . Attends Archivist Meetings:   Marland Kitchen Marital Status:   Intimate Partner Violence:   . Fear of Current or Ex-Partner:   . Emotionally  Abused:   Marland Kitchen Physically Abused:   . Sexually Abused:     Past Medical History, Surgical history, Social history, and Family history were reviewed and updated as appropriate.   Please see review of systems for further details on the patient's review from today.   Objective:   Physical Exam:  There were no vitals taken for this visit.  Physical Exam Constitutional:      General: She is not in acute distress. Musculoskeletal:        General: No deformity.  Neurological:     Mental Status: She is alert and oriented to person, place, and time.     Coordination: Coordination normal.  Psychiatric:        Attention and Perception: Attention and perception normal. She does not perceive auditory or visual hallucinations.        Mood and Affect: Mood is anxious and depressed. Affect is not labile, blunt, angry or inappropriate.        Speech: Speech normal.        Behavior: Behavior normal.        Thought Content: Thought content normal. Thought content is not paranoid or delusional. Thought content does not include homicidal or suicidal ideation. Thought content does not include homicidal or suicidal plan.        Cognition and Memory: Cognition and memory normal.        Judgment: Judgment normal.     Comments: Insight intact Not suicidal.  Stressed more than depressed.     Lab Review:     Component Value Date/Time   NA 137 01/02/2020 1122   K 3.8 01/02/2020 1122   CL 105 01/02/2020 1122   CO2 23 01/02/2020 1122   GLUCOSE  114 (H) 01/02/2020 1122   BUN 27 (H) 01/02/2020 1122   CREATININE 1.58 (H) 01/02/2020 1122   CALCIUM 9.3 01/02/2020 1122   PROT 8.4 (H) 01/02/2020 1122   ALBUMIN 5.0 01/02/2020 1122   AST 28 01/02/2020 1122   ALT 19 01/02/2020 1122   ALKPHOS 91 01/02/2020 1122   BILITOT 0.7 01/02/2020 1122   GFRNONAA 33 (L) 01/02/2020 1122   GFRAA 39 (L) 01/02/2020 1122       Component Value Date/Time   WBC 6.0 01/02/2020 1122   RBC 4.51 01/02/2020 1122   HGB 13.6  01/02/2020 1122   HCT 40.5 01/02/2020 1122   PLT 210 01/02/2020 1122   MCV 89.8 01/02/2020 1122   MCH 30.2 01/02/2020 1122   MCHC 33.6 01/02/2020 1122   RDW 11.9 01/02/2020 1122   LYMPHSABS 0.8 01/02/2020 1122   MONOABS 0.3 01/02/2020 1122   EOSABS 0.1 01/02/2020 1122   BASOSABS 0.0 01/02/2020 1122    No results found for: POCLITH, LITHIUM   No results found for: PHENYTOIN, PHENOBARB, VALPROATE, CBMZ   .res Assessment: Plan:    Loann was seen today for follow-up, depression, anxiety and stress.  Diagnoses and all orders for this visit:  Mixed bipolar II disorder with rapid cycling (HCC)  Generalized anxiety disorder  Insomnia due to mental condition  Migraine without aura and without status migrainosus, not intractable    Patient with a long history of bipolar disorder with rapid cycling that has been somewhat difficult to treat for a variety of reasons.  These include confounding migraine headaches which require a lot of medication and the fact that she is medication sensitive as well as having failed multiple medications.  In addition she refuses meds that can cause weight gain which greatly limit options.  Most recently she was hospitalized which was originally attributed to an intentional overdose which the patient strongly denies.  There is no evidence that she intentionally overdosed or had any suicidal intent.  She is not currently suicidal and has not been her recent history.  She has been very overwhelmed with caring for her mother's estate by herself and stressed by grief.  She has started counseling which is helpful.  She has recently reduced a number of the anticonvulsants that were used to help her migraine headaches.  She also has reduced lamotrigine out of concern that the combination of medications could have contributed to her being confused on the morning of the hospitalization.  She is not currently confused.  She is not a acutely depressed but more stressed.   She is not manic.  She has no psychosis.  There is no evidence of substance abuse.  As the patient was recently discharged from the hospital we will not change medications today.  Suggested if she felt excessively sedated from the morning dose of Geodon to let me know we could adjust on that.  Emphasized the importance of taking Geodon with at least 350 cal calories of food and to be very compliant and not miss dosages otherwise it could trigger withdrawal.  This is a 45-minute appointment  Because of recent hospitalization we will follow-up as soon as possible in 2 to 3 weeks  No med changes today.  Lynder Parents MD, DFAPA  Please see After Visit Summary for patient specific instructions.  No future appointments.  No orders of the defined types were placed in this encounter.   -------------------------------

## 2020-02-10 ENCOUNTER — Encounter: Payer: Self-pay | Admitting: Psychiatry

## 2020-02-10 ENCOUNTER — Telehealth (INDEPENDENT_AMBULATORY_CARE_PROVIDER_SITE_OTHER): Payer: Medicare Other | Admitting: Psychiatry

## 2020-02-10 DIAGNOSIS — F411 Generalized anxiety disorder: Secondary | ICD-10-CM

## 2020-02-10 DIAGNOSIS — G43009 Migraine without aura, not intractable, without status migrainosus: Secondary | ICD-10-CM | POA: Diagnosis not present

## 2020-02-10 DIAGNOSIS — F3181 Bipolar II disorder: Secondary | ICD-10-CM

## 2020-02-10 DIAGNOSIS — F5105 Insomnia due to other mental disorder: Secondary | ICD-10-CM

## 2020-02-10 MED ORDER — ZIPRASIDONE HCL 40 MG PO CAPS: 40.0000 mg | ORAL_CAPSULE | Freq: Two times a day (BID) | ORAL | 1 refills | Status: DC

## 2020-02-10 NOTE — Progress Notes (Signed)
Kelli Morales [id number] 05-Sep-1950 69 y.o.  Video Visit via My Chart  I connected with pt by My Chart and verified that I am speaking with the correct person using two identifiers.   I discussed the limitations, risks, security and privacy concerns of performing an evaluation and management service by My Chart  and the availability of in person appointments. I also discussed with the patient that there may be a patient responsible charge related to this service. The patient expressed understanding and agreed to proceed.  I discussed the assessment and treatment plan with the patient. The patient was provided an opportunity to ask questions and all were answered. The patient agreed with the plan and demonstrated an understanding of the instructions.   The patient was advised to call back or seek an in-person evaluation if the symptoms worsen or if the condition fails to improve as anticipated.  I provided 30 minutes of video time during this encounter.  The patient was located at home and the provider was located office. Call started at 1130 and ended 1200.  Subjective:   Patient ID:  Kelli Morales is a 69 y.o. (DOB [birth year]) female.  Chief Complaint:  Chief Complaint  Patient presents with  . Follow-up    Medication Management  . Other    Bipolar  . Depression    Medication Management  . Anxiety    HPI Kelli Morales presents to the office today for follow-up of bipolar disorder.  Recent hospitalization July 2021 for alleged intentional overdose of medications.  Discharged 01/02/2020 on clonazepam 0.5 mg 3 times daily as needed, lamotrigine 400 mg twice daily, topiramate 200 mg nightly, trazodone 300 mg nightly, and ziprasidone 40 mg twice daily, and zonisamide 300 mg daily.  12/01/2019 appointment notable for patient having seen significant mood stability and reduction of manic symptoms and depression with an increase in lamotrigine and the addition of Geodon 20 mg twice  daily.  No meds were changed.  01/18/20 appt with the following noted: Disc recent hospitalization.  Said she felt weak the day of hospitalization and grief and got distraught.  She denied over taking meds and got pulled over by police for driving erratically.   She denies intentional overdose but she was dehydrated. Upset over being at behavioral health hosp of 2 days. Lamotrigine reduced to 1 daily. Reduced Zonesimide gradually per MD. Reduced topiramate to 100 mg daily. Says she ate normally that day.   Says she's always been sensitive to Geodon but is doing OK with it right now.  No other explanation for what happened that day except she was distraught and overwhelmed with mother's estate. Started [facility]. Plan no med changes as recently hospitalized  02/10/2020 appt with the following noted: CO weight gain and thinks it is Geodon.  Gained weight gain on Seroquel and hasn't lost it.  Wants to wean off clonazepam.  Has cut so many meds back dramatically. BP has come back to normal. I feel really good.  Calmer and more focused.  No brain fog.  No sadness.  Don't get as hyper.  Anxiety not out of control.  Better overall. No HA.  Meditation and therapy and it helped to wean off migraine meds.   Now more ready to let go of clonazepam.   Sleep variable.  Get wound up at work and can have a hard time sleeping but not too bad.  Past Psychiatric Medication Trials: Vraylar early weight gain.  Perphenazine 8 mg,  quetiapine 300 mg, Abilify 10, lithium, Geodon, Depakote, CBZ remotely but this is uncertain,  Risperidone, latuda 5# weight gain, CBZ 300 sleepiness. Lamotrigine 300  Mirtazapine, paroxetine, Lexapro, sertraline, duloxetine 120,   Review of Systems:  Review of Systems  Cardiovascular: Negative for palpitations.  Neurological: Negative for dizziness, tremors, weakness and light-headedness.  Psychiatric/Behavioral: Positive for depression.    Medications: I have  reviewed the patient's current medications.  Current Outpatient Medications  Medication Sig Dispense Refill  . albuterol (VENTOLIN HFA) 108 (90 Base) MCG/ACT inhaler     . amLODipine (NORVASC) 10 MG tablet Take 1 tablet (10 mg total) by mouth daily. 30 tablet 0  . Ascorbic Acid (VITAMIN C PO) Take 1 tablet by mouth daily.    Marland Kitchen CALCIUM PO Take 1 tablet by mouth daily.    . cholecalciferol (VITAMIN D) 1000 UNITS tablet Take 1,000 Units by mouth 2 (two) times daily.     . clonazePAM (KLONOPIN) 0.5 MG tablet Take 1 tablet (0.5 mg total) by mouth 3 (three) times daily as needed for anxiety (and insomnia). 270 tablet 0  . Cyanocobalamin (VITAMIN B 12 PO) Take by mouth.    . denosumab (PROLIA) 60 MG/ML SOLN injection Inject 60 mg into the skin every 6 (six) months. Administer in upper arm, thigh, or abdomen    . fish oil-omega-3 fatty acids 1000 MG capsule Take 1 g by mouth daily.    . fluticasone (FLONASE) 50 MCG/ACT nasal spray Place 2 sprays into the nose daily.    . Fluticasone Furoate (ARNUITY ELLIPTA IN) Inhale 1 puff into the lungs at bedtime.     . Glycerin-Hypromellose-PEG 400 ([employer] DRY EYE RELIEF OP) Apply 1 drop to eye at bedtime as needed (dry eyes).    . IRON PO Take 1 tablet by mouth daily.    . magnesium gluconate (MAGONATE) 500 MG tablet Take 500 mg by mouth daily.    . Multiple Vitamins-Minerals (COMPLETE MULTIVITAMIN/MINERAL PO) Take 1 tablet by mouth daily.    . NONFORMULARY OR COMPOUNDED ITEM Estradiol vaginal cream 0.02% insert 1 gram vaginally twice weekly 90 each 3  . pantoprazole (PROTONIX) 40 MG tablet Take 40 mg by mouth daily.    Marland Kitchen topiramate (TOPAMAX) 200 MG tablet Take 1 tablet (200 mg total) by mouth daily. (Patient taking differently: Take 50 mg by mouth daily. ) 90 tablet 1  . traMADol (ULTRAM) 50 MG tablet Take 1-2 tablets by mouth as needed.    . ziprasidone (GEODON) 40 MG capsule Take 1 capsule (40 mg total) by mouth 2 (two) times daily with a meal. 180 capsule 1    No current facility-administered medications for this visit.    Medication Side Effects: None  Allergies:  Allergies  Allergen Reactions  . Dilaudid [Hydromorphone Hcl] Other (See Comments)    Nightmares  . Divalproex Sodium     Became comatose when she took Depakote and Geodon together.  . Nsaids     "kidney problem"  . Perphenazine-Amitriptyline Itching  . Seroquel [Quetiapine Fumerate]     Past Medical History:  Diagnosis Date  . Anxiety   . Asthma   . Bipolar disorder ([place])   . Cataracts, bilateral   . Depression   . GERD (gastroesophageal reflux disease)   . Hydatid cyst    Left  . Hypertension   . Kidney problem    RT kidney is smaller -> decreased output  . Migraine   . Osteoporosis 11/2016   T score -2.8  improved  from prior DEXA  . Right rotator cuff tear   . Vertigo     Family History  Problem Relation Age of Onset  . Diabetes Father   . Hypertension Father   . Heart disease Father   . Stroke Father   . Cancer Father        Lymphoma  . Hypertension Mother   . Heart disease Mother   . Stroke Mother   . Stroke Paternal Uncle   . Heart disease Brother     Social History   Socioeconomic History  . Marital status: Divorced    Spouse name: Not on file  . Number of children: 1  . Years of education: MSN  . Highest education level: Not on file  Occupational History  . Not on file  Tobacco Use  . Smoking status: Never Smoker  . Smokeless tobacco: Never Used  Vaping Use  . Vaping Use: Never used  Substance and Sexual Activity  . Alcohol use: Yes    Alcohol/week: 0.0 standard drinks    Comment: Rare  . Drug use: No  . Sexual activity: Not Currently    Birth control/protection: Post-menopausal    Comment: 1st intercourse 30 yo-2 partners  Other Topics Concern  . Not on file  Social History Narrative   Lives with husband and daughter   Caffeine use: 2 cups per day   Right handed   Social Determinants of Health   Financial Resource  Strain:   . Difficulty of Paying Living Expenses: Not on file  Food Insecurity:   . Worried About Charity fundraiser in the Last Year: Not on file  . Ran Out of Food in the Last Year: Not on file  Transportation Needs:   . Lack of Transportation (Medical): Not on file  . Lack of Transportation (Non-Medical): Not on file  Physical Activity:   . Days of Exercise per Week: Not on file  . Minutes of Exercise per Session: Not on file  Stress:   . Feeling of Stress : Not on file  Social Connections:   . Frequency of Communication with Friends and Family: Not on file  . Frequency of Social Gatherings with Friends and Family: Not on file  . Attends Religious Services: Not on file  . Active Member of Clubs or Organizations: Not on file  . Attends Archivist Meetings: Not on file  . Marital Status: Not on file  Intimate Partner Violence:   . Fear of Current or Ex-Partner: Not on file  . Emotionally Abused: Not on file  . Physically Abused: Not on file  . Sexually Abused: Not on file    Past Medical History, Surgical history, Social history, and Family history were reviewed and updated as appropriate.   Please see review of systems for further details on the patient's review from today.   Objective:   Physical Exam:  There were no vitals taken for this visit.  Physical Exam Constitutional:      General: She is not in acute distress. Musculoskeletal:        General: No deformity.  Neurological:     Mental Status: She is alert and oriented to person, place, and time.     Coordination: Coordination normal.  Psychiatric:        Attention and Perception: Attention and perception normal. She does not perceive auditory or visual hallucinations.        Mood and Affect: Mood is not anxious or depressed. Affect is not  labile, blunt, angry or inappropriate.        Speech: Speech normal.        Behavior: Behavior normal.        Thought Content: Thought content normal. Thought  content is not paranoid or delusional. Thought content does not include homicidal or suicidal ideation. Thought content does not include homicidal or suicidal plan.        Cognition and Memory: Cognition and memory normal.        Judgment: Judgment normal.     Comments: Insight intact Not suicidal.       Lab Review:     Component Value Date/Time   NA 137 01/02/2020 1122   K 3.8 01/02/2020 1122   CL 105 01/02/2020 1122   CO2 23 01/02/2020 1122   GLUCOSE 114 (H) 01/02/2020 1122   BUN 27 (H) 01/02/2020 1122   CREATININE 1.58 (H) 01/02/2020 1122   CALCIUM 9.3 01/02/2020 1122   PROT 8.4 (H) 01/02/2020 1122   ALBUMIN 5.0 01/02/2020 1122   AST 28 01/02/2020 1122   ALT 19 01/02/2020 1122   ALKPHOS 91 01/02/2020 1122   BILITOT 0.7 01/02/2020 1122   GFRNONAA 33 (L) 01/02/2020 1122   GFRAA 39 (L) 01/02/2020 1122       Component Value Date/Time   WBC 6.0 01/02/2020 1122   RBC 4.51 01/02/2020 1122   HGB 13.6 01/02/2020 1122   HCT 40.5 01/02/2020 1122   PLT 210 01/02/2020 1122   MCV 89.8 01/02/2020 1122   MCH 30.2 01/02/2020 1122   MCHC 33.6 01/02/2020 1122   RDW 11.9 01/02/2020 1122   LYMPHSABS 0.8 01/02/2020 1122   MONOABS 0.3 01/02/2020 1122   EOSABS 0.1 01/02/2020 1122   BASOSABS 0.0 01/02/2020 1122    No results found for: POCLITH, LITHIUM   No results found for: PHENYTOIN, PHENOBARB, VALPROATE, CBMZ   .res Assessment: Plan:    Linzi was seen today for follow-up, other, depression and anxiety.  Diagnoses and all orders for this visit:  Mixed bipolar II disorder with rapid cycling ([place]) -     ziprasidone (GEODON) 40 MG capsule; Take 1 capsule (40 mg total) by mouth 2 (two) times daily with a meal.  Generalized anxiety disorder  Insomnia due to mental condition  Migraine without aura and without status migrainosus, not intractable    Patient with a long history of bipolar disorder with rapid cycling that has been somewhat difficult to treat for a variety of  reasons.  These include confounding migraine headaches which require a lot of medication and the fact that she is medication sensitive as well as having failed multiple medications.  In addition she refuses meds that can cause weight gain which greatly limit options.  Most recently she was hospitalized which was originally attributed to an intentional overdose which the patient strongly denies.  There is no evidence that she intentionally overdosed or had any suicidal intent.  She is not currently suicidal and has not been her recent history.  She has been very overwhelmed with caring for her mother's estate by herself and stressed by grief.  She has started counseling which is helpful.  She has recently reduced a number of the anticonvulsants that were used to help her migraine headaches.  She also has reduced lamotrigine out of concern that the combination of medications could have contributed to her being confused on the morning of the hospitalization.  She is not currently confused.  She is not a acutely depressed but more  stressed.  She is not manic.  She has no psychosis.  There is no evidence of substance abuse. Off HA meds a couple of months and doing OK.  As the patient was recently discharged from the hospital we will not change medications today.  Suggested if she felt excessively sedated from the morning dose of Geodon to let me know we could adjust on that.  Emphasized the importance of taking Geodon with at least 350 cal calories of food and to be very compliant and not miss dosages otherwise it could trigger withdrawal. Continue Geodon 40 mg BID .  She agrees.  We discussed the short-term risks associated with benzodiazepines including sedation and increased fall risk among others.  Discussed long-term side effect risk including dependence, potential withdrawal symptoms, and the potential eventual dose-related risk of dementia.  But recent studies from 2020 dispute this association between  benzodiazepines and dementia risk. Newer studies in 2020 do not support an association with dementia. Disc her desire to taper clonazepam.  Need to protect sleep.  Taking 1.5 mg HS. Reduce no faster than 0.25 mg reduction per month.  She agrees.  Emphasized need to stay on mood stabilizer.  This is a 30 min appt  No other med changes today.  FU 8 weeks  Lynder Parents MD, DFAPA  Please see After Visit Summary for patient specific instructions.  No future appointments.  No orders of the defined types were placed in this encounter.   -------------------------------

## 2020-02-22 ENCOUNTER — Telehealth: Payer: Self-pay | Admitting: Psychiatry

## 2020-02-22 NOTE — Telephone Encounter (Signed)
Kelli Morales called to report that since the increase of the Geodon 40mg  2/day on 01/04/20 she has gained 10 pounds.  She is thinking the the increase is too much and that is what is causing the weight gain.  Please call to discuss what you think could be the cause and what can be done about.

## 2020-02-22 NOTE — Telephone Encounter (Signed)
Please review

## 2020-02-29 NOTE — Telephone Encounter (Signed)
She has tried all of the mood stabilizers with the lowest weight gain risk.  She is not overweight.  But she is very focused on her weight.  The only alternative I see that she is not taking with low weight gain risk is haloperidol and typically ziprasidone or Geodon has even lower weight gain risk than haloperidol but there is individual variation. She had a psychiatric hospitalization on Geodon 20 mg twice daily so I would not be in favor of reducing the dose below Geodon 40 mg twice daily.

## 2020-03-02 NOTE — Telephone Encounter (Signed)
Discussed information and she didn't like the sound of that either. She's very concerned her BMI is 27 now. Advised her to set up an apt to discuss further with Dr. Clovis Pu. She will call back to set that up.

## 2020-03-13 IMAGING — MR MR LUMBAR SPINE W/O CM
4 of 5 series · 17 of 48 positions shown · non-contrast
Comparison: Prior CT from earlier the same day.

CLINICAL DATA: Initial evaluation for acute vertebral body
fractures.

EXAM:
MRI THORACIC AND LUMBAR SPINE WITHOUT CONTRAST
TECHNIQUE: Multiplanar and multiecho pulse sequences of the thoracic and lumbar
spine were obtained without intravenous contrast.

[Series 5: T1 · sagittal · 4.0mm · 0.53mm/px · 3 of 12 slices shown (1 of 2)]
[im 1/12]
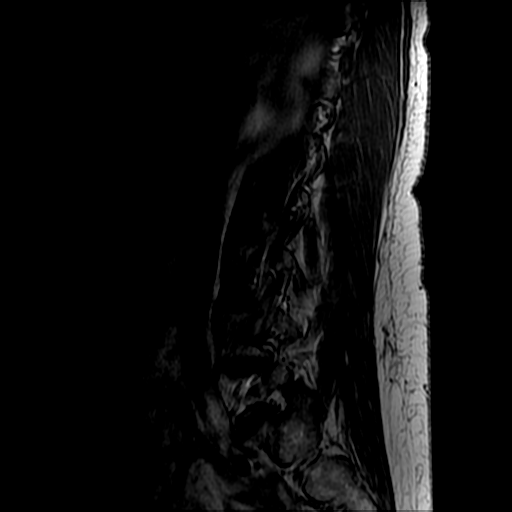
[im 8/12]
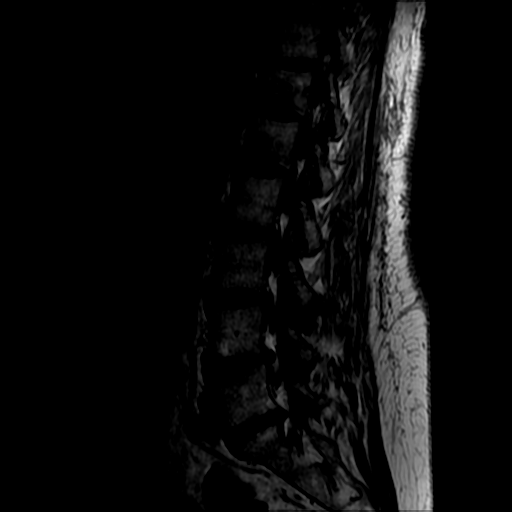
[im 12/12]
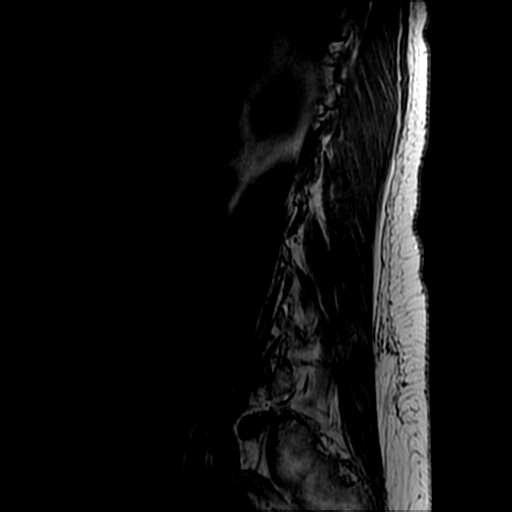

[Series 6: T2 post-contrast · sagittal · 4.0mm · 0.53mm/px · 5 of 12 slices shown]
[im 1/12]
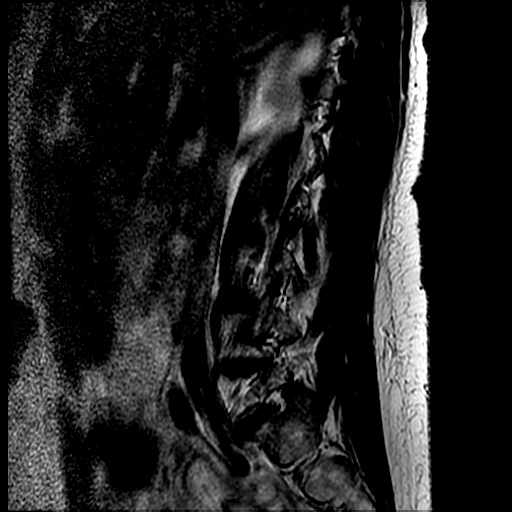
[im 3/12]
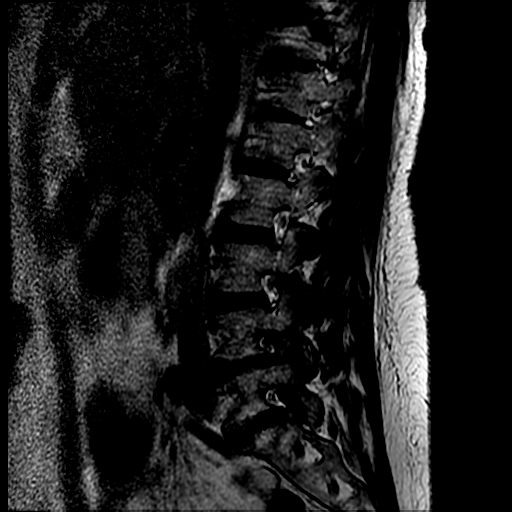
[im 6/12]
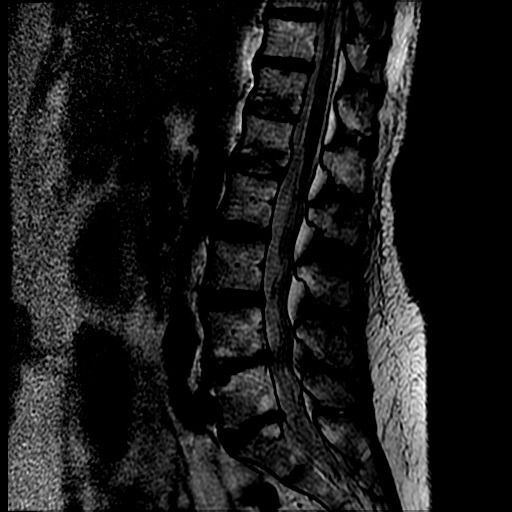
[im 9/12]
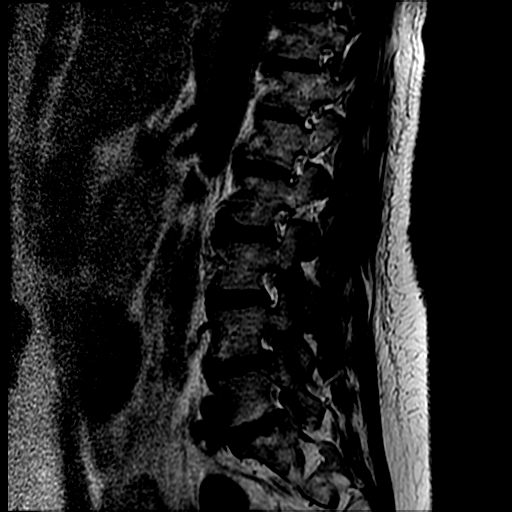
[im 12/12]
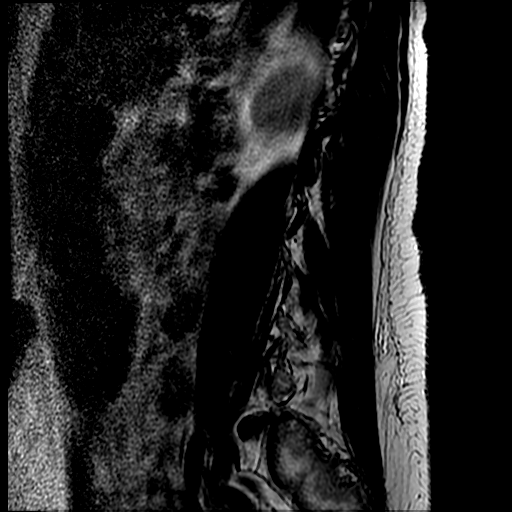

[Series 8: T2 · axial · 4.0mm · 0.39mm/px · z∈[-470,-272]mm · 6 of 42 slices shown]
[im 3/42]
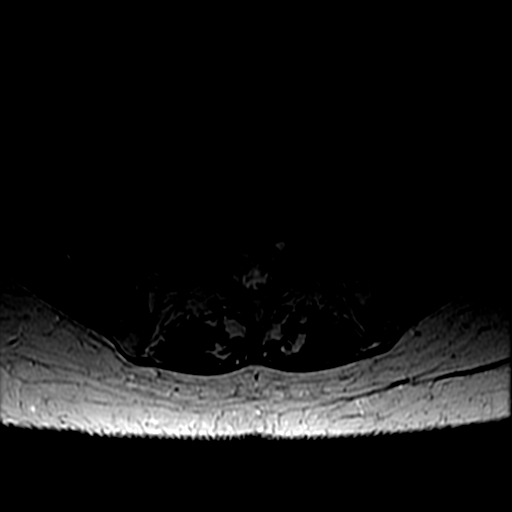
[im 6/42]
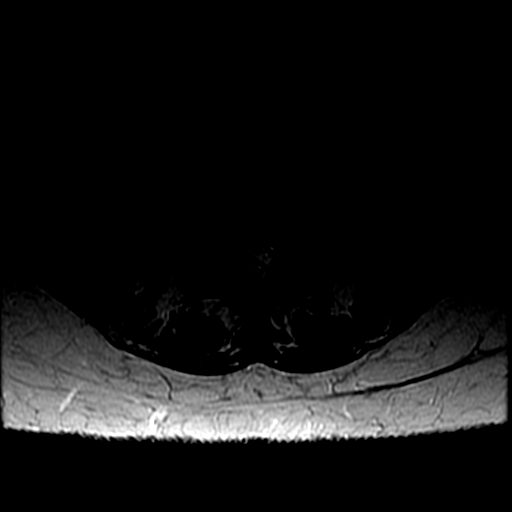
[im 8/42]
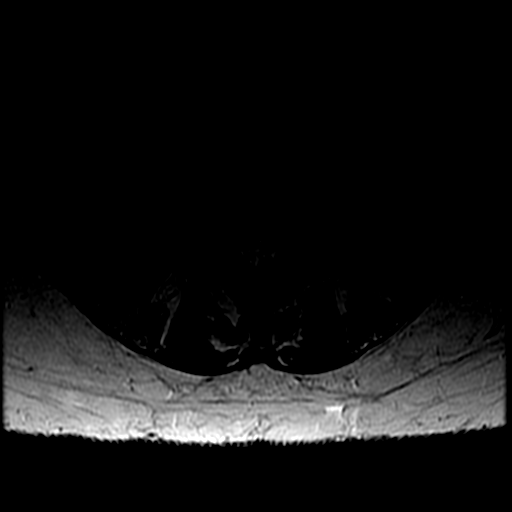
[im 13/42]
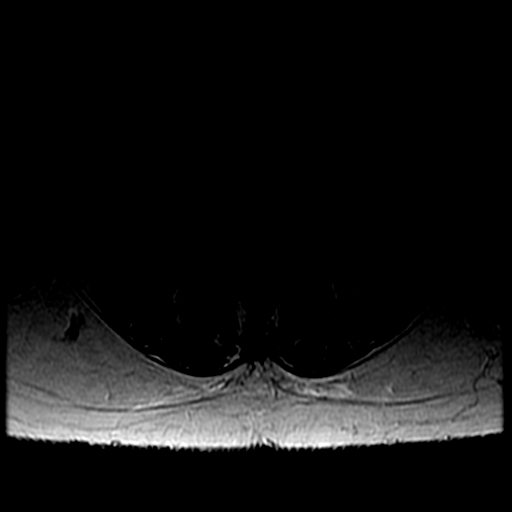
[im 21/42]
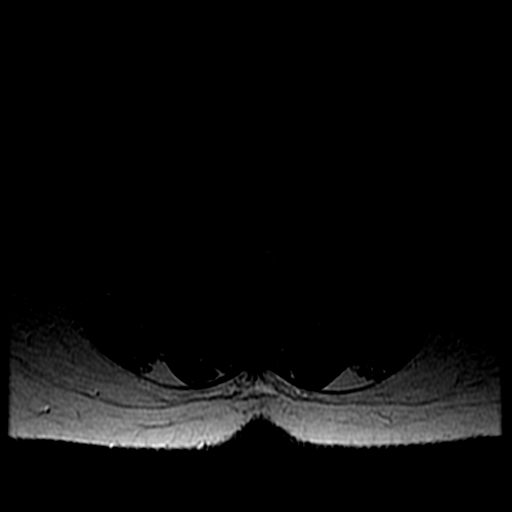
[im 36/42]
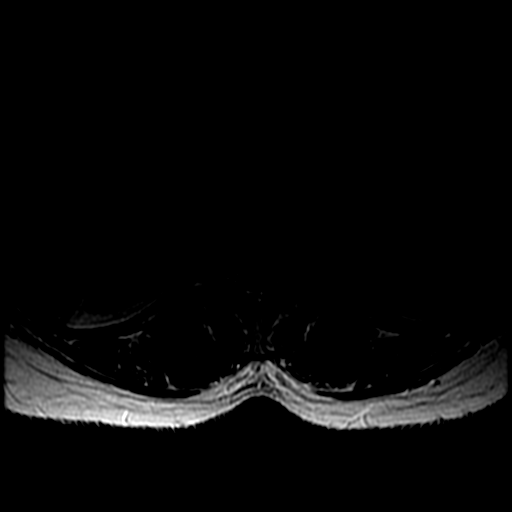

[Series 9: T1 · axial · 4.0mm · 0.39mm/px · z∈[-455,-272]mm · 3 of 42 slices shown (2 of 2)]
[im 6/42]
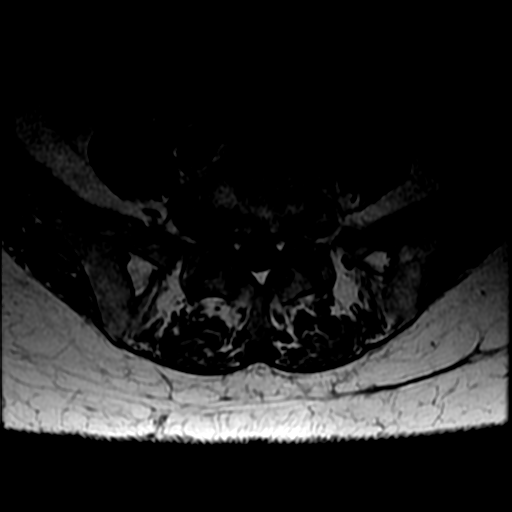
[im 21/42]
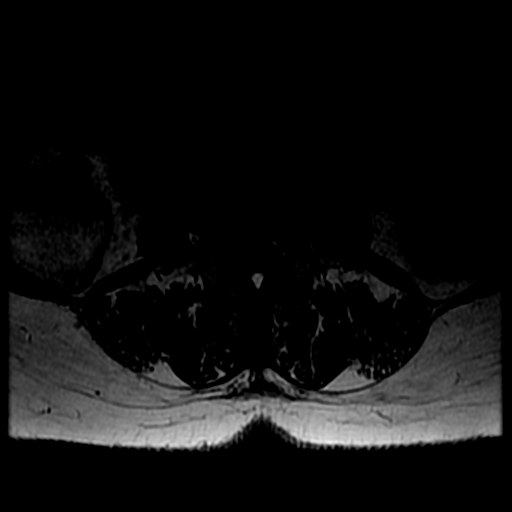
[im 36/42]
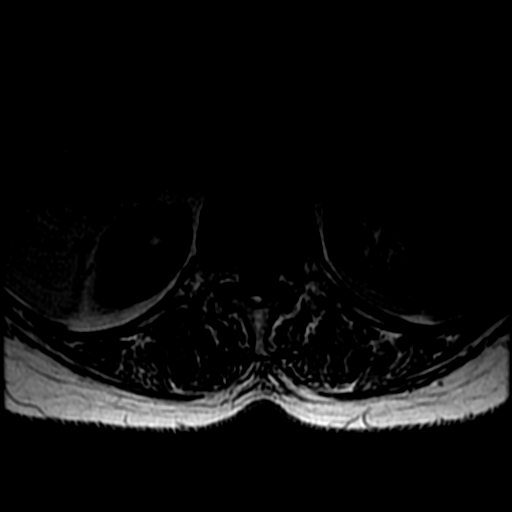

[17 of 48 positions shown; findings below may reference images not displayed]

FINDINGS: MRI THORACIC SPINE FINDINGS

Alignment: Vertebral bodies normally aligned with preservation of
the normal thoracic kyphosis. No listhesis.

Vertebrae: Acute compression fractures involving the inferior
endplates of T12 and L1, described on corresponding lumbar spine
portion of this exam. Vertebral body heights otherwise maintained
without evidence for acute or chronic fracture. Underlying bone
marrow signal intensity within normal limits. Few small benign
hemangiomas noted, most prominent of which in the T11 vertebral
body. No worrisome osseous lesions. No other abnormal marrow edema.

Cord:  Signal intensity within the thoracic spinal cord is normal.

Paraspinal and other soft tissues: Paraspinous soft tissues
demonstrate no acute finding. Small layering bilateral pleural
effusions noted. Cholelithiasis. Asymmetric right renal atrophy
noted.

Disc levels:

No significant disc pathology seen within the thoracic spine. No
canal or neural foraminal stenosis.

MRI LUMBAR SPINE FINDINGS

Segmentation: Standard. Lowest well-formed disc labeled the L5-S1
level.

Alignment: Trace 2 mm retrolisthesis of L5 on S1. Alignment
otherwise normal with preservation of the normal lumbar lordosis.

Vertebrae: Acute compression fracture extends through the inferior
endplate of T12. Associated mild central height loss of up to 25%
without bony retropulsion. Additional acute compression fracture
extends through the inferior endplate of L1 with up to 25% central
height loss with minimal 3 mm bony retropulsion. These are
benign/mechanical in appearance. Vertebral body heights otherwise
maintained without evidence for acute or chronic fracture.
Underlying bone marrow signal intensity within normal limits. Few
small benign hemangiomas noted. No worrisome osseous lesions.
Reactive endplate changes noted about the L4-5 and L5-S1
interspaces. Mild edema about the left L3-4 facet due to facet
arthritis. No other abnormal marrow edema.

Conus medullaris and cauda equina: Conus extends to the L2 level.
Conus and cauda equina appear normal.

Paraspinal and other soft tissues: Paraspinous soft tissues
demonstrate no acute finding. No other vessel oral abnormality.

Disc levels:

L1-2: Trace 3 mm bony retropulsion related to the L1 compression
fracture. Minimal flattening of the ventral thecal sac without
stenosis. Foramina remain patent.

L2-3: Disc desiccation without significant disc bulge. No stenosis
or impingement.

L3-4: Minimal annular disc bulge with disc desiccation. Mild facet
and ligament flavum hypertrophy. No significant canal or foraminal
stenosis.

L4-5: Chronic intervertebral disc space narrowing with diffuse disc
bulge and disc desiccation. Associated reactive endplate changes.
Mild facet and ligament flavum hypertrophy. Resultant moderate canal
with bilateral subarticular stenosis. Mild left with mild to
moderate right L4 foraminal narrowing.

L5-S1: Trace retrolisthesis. Diffuse degenerative disc bulge with
disc desiccation and intervertebral disc space narrowing. Chronic
reactive endplate changes with marginal endplate osteophytic
spurring. Mild bilateral facet hypertrophy. No significant canal or
lateral recess stenosis. Mild bilateral L5 foraminal narrowing.
IMPRESSION: 1. Acute compression fractures involving the inferior endplates of
T12 and L1 with up to 25% central height loss. Trace 3 mm bony
retropulsion at L1 without associated stenosis. No significant bony
retropulsion or stenosis at T12.
2. Multifactorial degenerative changes at L4-5 with resultant
moderate canal with bilateral subarticular stenosis, with mild to
moderate right greater than left L4 foraminal narrowing.
3. Degenerative spondylolysis at L5-S1 with resultant mild bilateral
L5 foraminal stenosis.
4. Small bilateral pleural effusions.

## 2020-03-13 IMAGING — MR MR HEAD W/O CM
10 series · 43 of 48 positions shown · IV contrast (Yes)
Comparison: None.

CLINICAL DATA: Multiple syncopal episodes.  Fall.

EXAM:
MRI HEAD WITHOUT CONTRAST
TECHNIQUE: Multiplanar, multiecho pulse sequences of the brain and surrounding
structures were obtained without intravenous contrast.

[Series 4: T1 · sagittal · 5.0mm · 0.47mm/px · 3 of 23 slices shown]
[im 1/23]
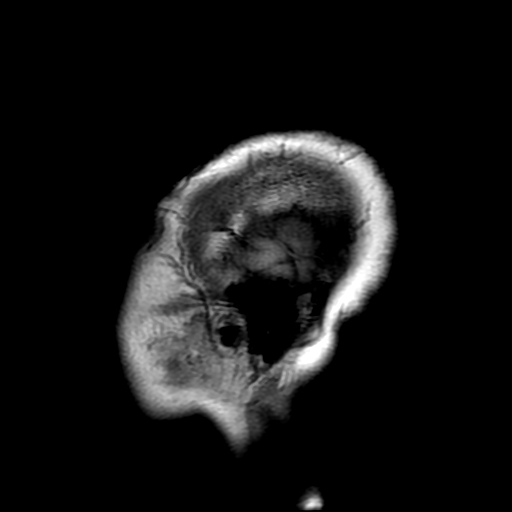
[im 12/23]
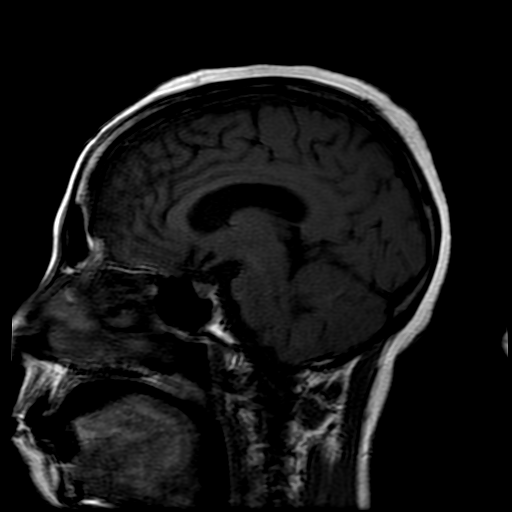
[im 23/23]
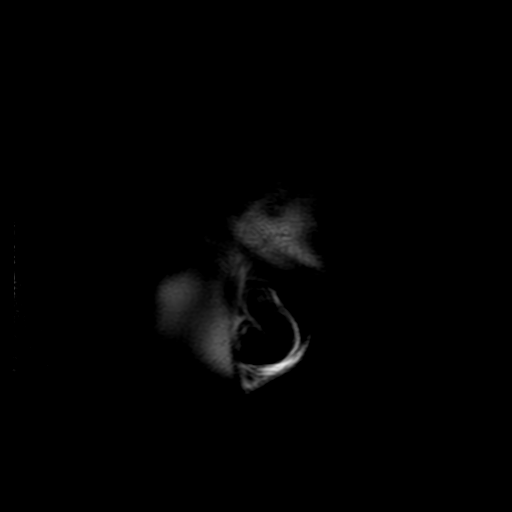

[Series 7: T2 · axial · 5.0mm · 0.86mm/px · z∈[-64,+74]mm · 3 of 24 slices shown (1 of 2)]
[im 1/24]
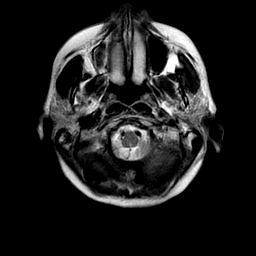
[im 12/24]
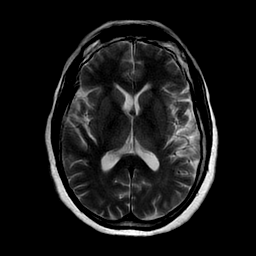
[im 24/24]
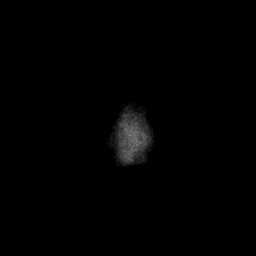

[Series 9: FLAIR · axial · 3.0mm · 0.86mm/px · z∈[-64,+74]mm · 3 of 24 slices shown]
[im 1/24]
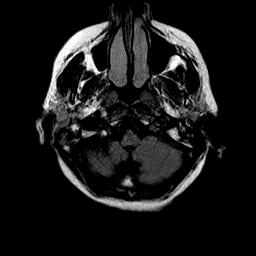
[im 12/24]
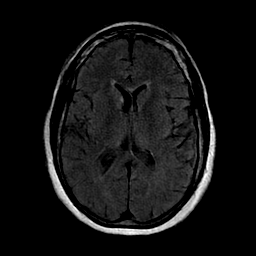
[im 24/24]
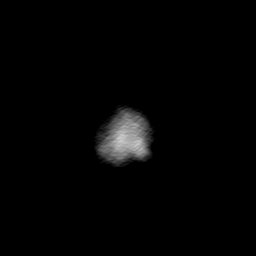

[Series 10: ax mpgr · axial · 5.0mm · 0.43mm/px · z∈[-75,+86]mm · 3 of 24 slices shown]
[im 1/24]
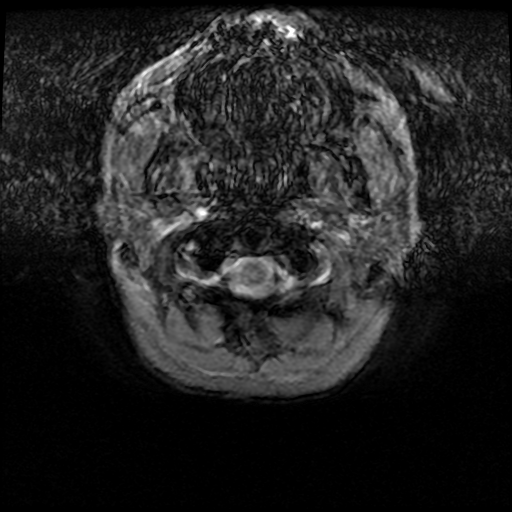
[im 12/24]
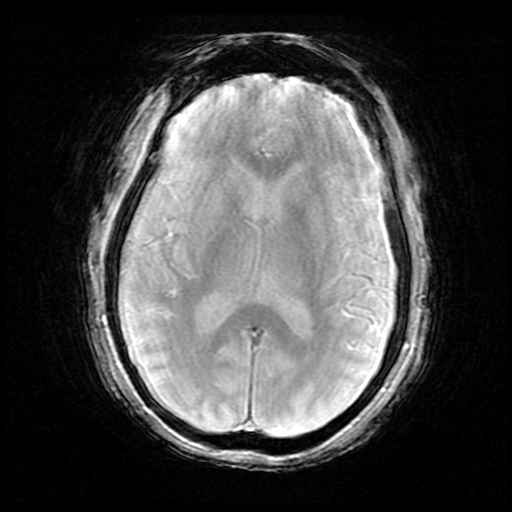
[im 24/24]
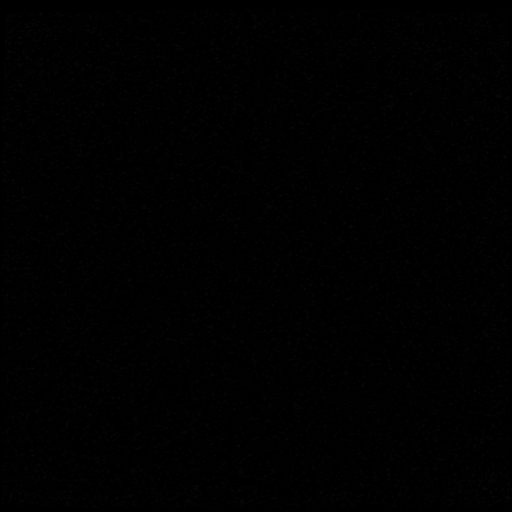

[Series 11: ax fspgr irp · axial · 3.0mm · 0.47mm/px · z∈[-68,-11]mm · 3 of 50 slices shown]
[im 1/50]
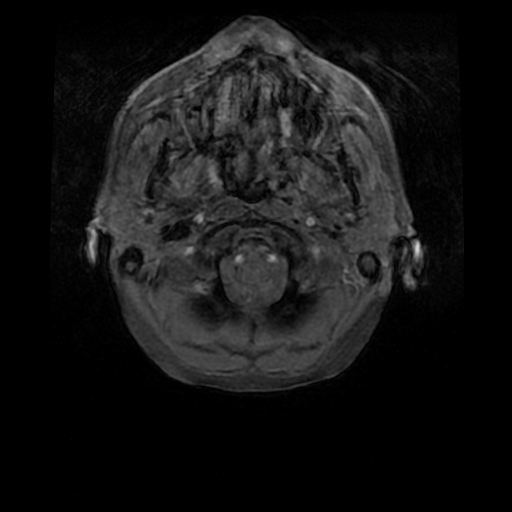
[im 10/50]
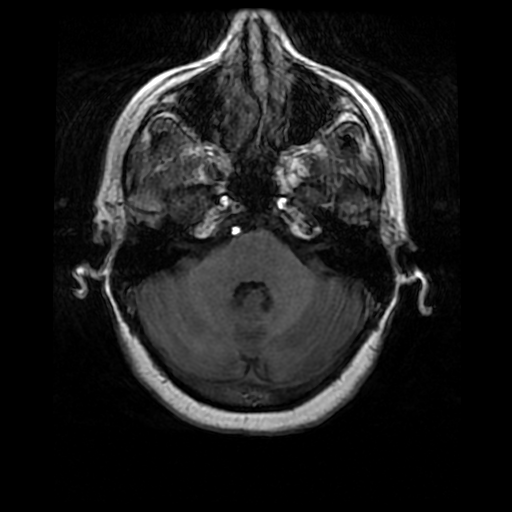
[im 20/50]
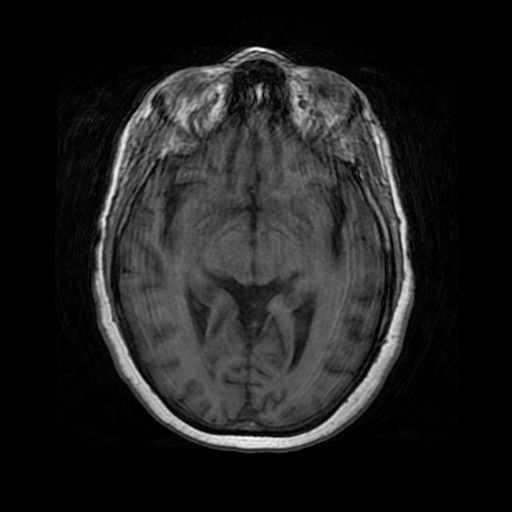

[Series 12: T2 · coronal · 5.0mm · 0.90mm/px · 3 of 26 slices shown (2 of 2)]
[im 1/26]
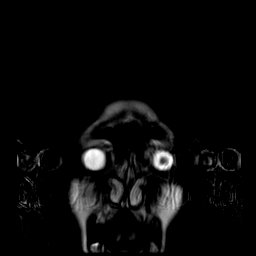
[im 13/26]
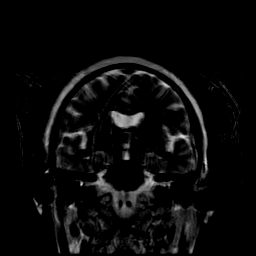
[im 26/26]
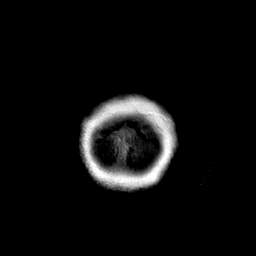

[Series 13: DWI · axial · 4.0mm · 1.17mm/px · z∈[-65,+75]mm · 8 of 64 slices shown (1 of 4)]
[im 1/64]
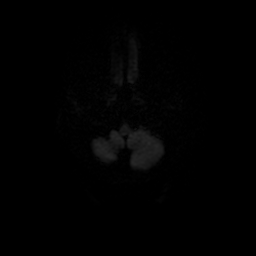
[im 10/64]
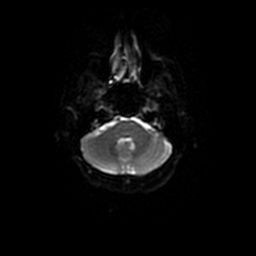
[im 19/64]
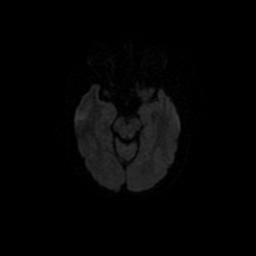
[im 28/64]
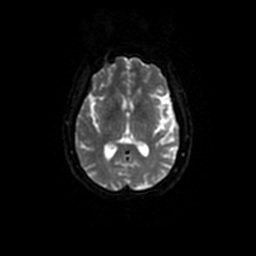
[im 37/64]
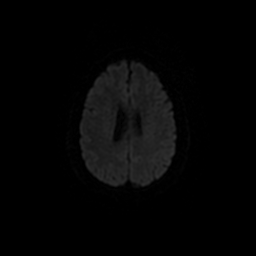
[im 46/64]
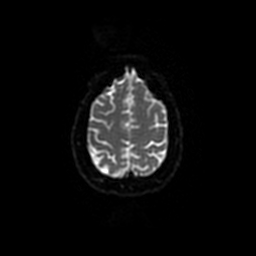
[im 55/64]
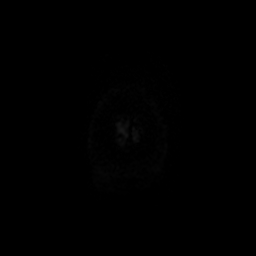
[im 64/64]
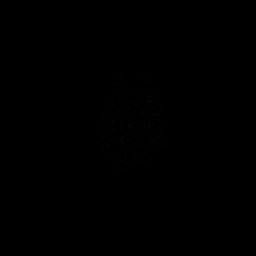

[Series 14: DWI · coronal · 4.0mm · 1.09mm/px · 8 of 82 slices shown (2 of 4)]
[im 1/82]
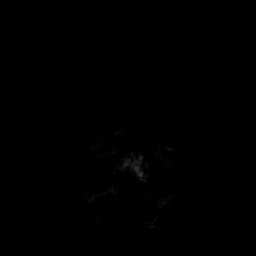
[im 10/82]
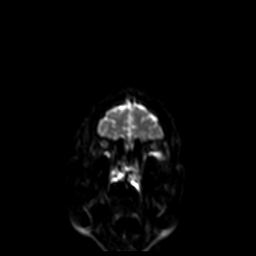
[im 28/82]
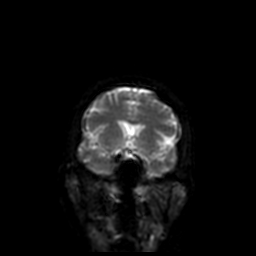
[im 37/82]
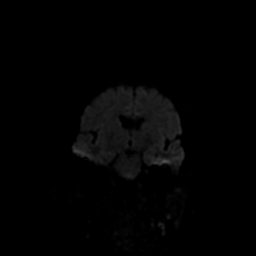
[im 46/82]
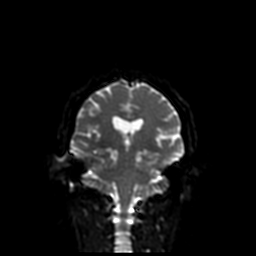
[im 55/82]
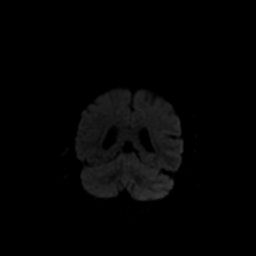
[im 73/82]
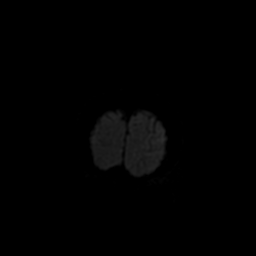
[im 82/82]
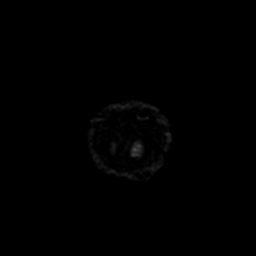

[Series 1300: DWI · axial · 4.0mm · 1.17mm/px · z∈[-65,+70]mm · 4 of 31 slices shown (3 of 4)]
[im 1/31]
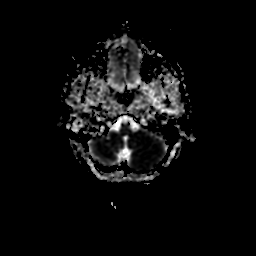
[im 11/31]
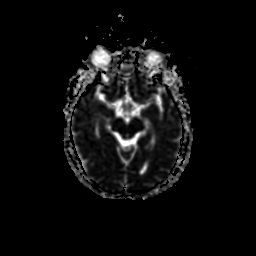
[im 21/31]
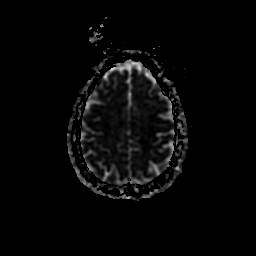
[im 31/31]
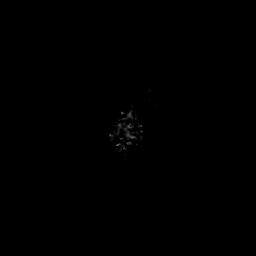

[Series 1400: DWI · coronal · 4.0mm · 1.09mm/px · 5 of 41 slices shown (4 of 4)]
[im 1/41]
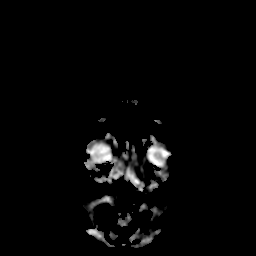
[im 11/41]
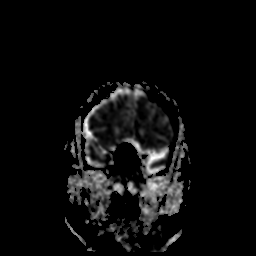
[im 21/41]
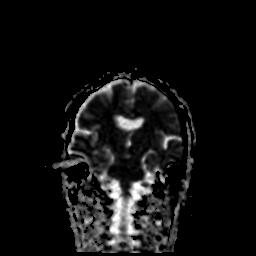
[im 31/41]
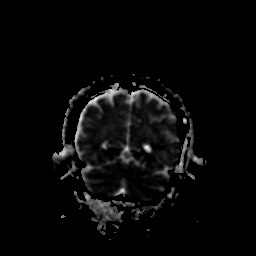
[im 41/41]
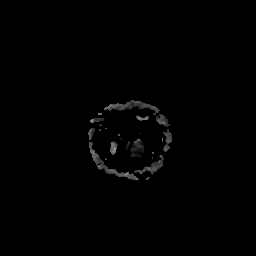

[43 of 48 positions shown; findings below may reference images not displayed]

FINDINGS: Brain: The diffusion-weighted images demonstrate no evidence for
acute or subacute infarction. Study is mildly degraded by patient
motion. No significant white matter lesions are present. The
ventricles are of normal size. No significant extraaxial fluid
collection is present.

The internal auditory canals are within normal limits. The brainstem
and cerebellum are within normal limits.

Vascular: Flow is present in the major intracranial arteries.

Skull and upper cervical spine: The craniocervical junction is
normal. Upper cervical spine is within normal limits. Marrow signal
is unremarkable.

Sinuses/Orbits: The paranasal sinuses and mastoid air cells are
clear. Right lens replacement is present. Globes and orbits are
otherwise normal.
IMPRESSION: 1. Normal MRI appearance of the brain for age. No acute or focal
lesion to explain syncopal episodes.

## 2020-03-19 ENCOUNTER — Ambulatory Visit (INDEPENDENT_AMBULATORY_CARE_PROVIDER_SITE_OTHER): Payer: Medicare Other | Admitting: Psychiatry

## 2020-03-19 ENCOUNTER — Other Ambulatory Visit: Payer: Self-pay

## 2020-03-19 ENCOUNTER — Encounter: Payer: Self-pay | Admitting: Psychiatry

## 2020-03-19 DIAGNOSIS — F411 Generalized anxiety disorder: Secondary | ICD-10-CM

## 2020-03-19 DIAGNOSIS — F5105 Insomnia due to other mental disorder: Secondary | ICD-10-CM

## 2020-03-19 DIAGNOSIS — F3181 Bipolar II disorder: Secondary | ICD-10-CM | POA: Diagnosis not present

## 2020-03-19 MED ORDER — LAMOTRIGINE 100 MG PO TABS: 100.0000 mg | ORAL_TABLET | Freq: Two times a day (BID) | ORAL | 1 refills | Status: DC

## 2020-03-19 NOTE — Patient Instructions (Addendum)
Ozempic for weight loss  [place]

## 2020-03-19 NOTE — Progress Notes (Signed)
Kelli Morales [id number] 11-Jul-1950 69 y.o.   Subjective:   Patient ID:  Kelli Morales is a 69 y.o. (DOB 1950-08-18) female.  Chief Complaint:  Chief Complaint  Patient presents with  . Follow-up    Medication management  . Depression    Medication management  . Other    Bipolar 1    Depression        Kelli Morales presents to the office today for follow-up of bipolar disorder.  Recent hospitalization July 2021 for alleged intentional overdose of medications.  Discharged 01/02/2020 on clonazepam 0.5 mg 3 times daily as needed, lamotrigine 400 mg twice daily, topiramate 200 mg nightly, trazodone 300 mg nightly, and ziprasidone 40 mg twice daily, and zonisamide 300 mg daily.  12/01/2019 appointment notable for patient having seen significant mood stability and reduction of manic symptoms and depression with an increase in lamotrigine and the addition of Geodon 20 mg twice daily.  No meds were changed.  01/18/20 appt with the following noted: Disc recent hospitalization.  Said she felt weak the day of hospitalization and grief and got distraught.  She denied over taking meds and got pulled over by police for driving erratically.   She denies intentional overdose but she was dehydrated. Upset over being at behavioral health hosp of 2 days. Lamotrigine reduced to 1 daily. Reduced Zonesimide gradually per MD. Reduced topiramate to 100 mg daily. Says she ate normally that day.   Says she's always been sensitive to Geodon but is doing OK with it right now.  No other explanation for what happened that day except she was distraught and overwhelmed with mother's estate. Started [facility] therapy [facility]. Plan no med changes as recently hospitalized  02/10/2020 appt with the following noted: CO weight gain and thinks it is Geodon.  Gained weight gain on Seroquel and hasn't lost it.  Wants to wean off clonazepam.  Has cut so many meds back dramatically. BP has come back to normal. I feel  really good.  Calmer and more focused.  No brain fog.  No sadness.  Don't get as hyper.  Anxiety not out of control.  Better overall. No HA.  Meditation and therapy and it helped to wean off migraine meds.   Now more ready to let go of clonazepam.   Sleep variable.  Get wound up at work and can have a hard time sleeping but not too bad. Plan: Disc her desire to taper clonazepam.  Need to protect sleep.  Taking 1.5 mg HS. Reduce no faster than 0.25 mg reduction per month.   02/22/2020 phone call from patient complaining of 10 pound weight gain since January 04, 2020 when starting Geodon 40 mg twice daily. MD response:She has tried all of the mood stabilizers with the lowest weight gain risk.  She is not overweight.  But she is very focused on her weight.  The only alternative I see that she is not taking with low weight gain risk is haloperidol and typically ziprasidone or Geodon has even lower weight gain risk than haloperidol but there is individual variation. She had a psychiatric hospitalization on Geodon 20 mg twice daily so I would not be in favor of reducing the dose below Geodon 40 mg twice daily.  03/19/20 appt with following noted: Took herself off the Geodon bc jitters and weight gain. Restarted lamotrigine at 100 mg BID. Has gained from 128# to 155 # and she attributes to meds including quetiapine and perphenazine. Some anxiety.  Still on clonazepam 1.5 mg daily.  Occ trazodone prn. Patient reports stable mood and denies depressed or irritable moods.   Patient denies difficulty with sleep initiation or maintenance. Denies appetite disturbance.  Patient reports that energy and motivation have been good.  Patient denies any difficulty with concentration.  Patient denies any suicidal ideation.  Past Psychiatric Medication Trials: Vraylar early weight gain.  Perphenazine 8 mg, quetiapine 300 mg, Abilify 10, lithium, Geodon 40 BID, Depakote, CBZ remotely but this is uncertain,  Risperidone,  latuda 5# weight gain, CBZ 300 sleepiness. Lamotrigine 300  Mirtazapine, paroxetine, Lexapro, sertraline, duloxetine 120,  Psychiatric hospitalizations summer 2021  Review of Systems:  Review of Systems  Constitutional: Positive for unexpected weight change.  Cardiovascular: Negative for palpitations.  Neurological: Negative for dizziness, tremors, weakness and light-headedness.  Psychiatric/Behavioral: Positive for depression.    Medications: I have reviewed the patient's current medications.  Current Outpatient Medications  Medication Sig Dispense Refill  . albuterol (VENTOLIN HFA) 108 (90 Base) MCG/ACT inhaler     . amLODipine (NORVASC) 10 MG tablet Take 1 tablet (10 mg total) by mouth daily. (Patient taking differently: Take 5 mg by mouth daily. ) 30 tablet 0  . Ascorbic Acid (VITAMIN C PO) Take 1 tablet by mouth daily.    Marland Kitchen CALCIUM PO Take 1 tablet by mouth daily.    . cholecalciferol (VITAMIN D) 1000 UNITS tablet Take 1,000 Units by mouth 2 (two) times daily.     . clonazePAM (KLONOPIN) 0.5 MG tablet Take 1 tablet (0.5 mg total) by mouth 3 (three) times daily as needed for anxiety (and insomnia). 270 tablet 0  . Cyanocobalamin (VITAMIN B 12 PO) Take by mouth.    . denosumab (PROLIA) 60 MG/ML SOLN injection Inject 60 mg into the skin every 6 (six) months. Administer in upper arm, thigh, or abdomen    . fish oil-omega-3 fatty acids 1000 MG capsule Take 1 g by mouth daily.    . fluticasone (FLONASE) 50 MCG/ACT nasal spray Place 2 sprays into the nose daily.    . fluticasone furoate-vilanterol (BREO ELLIPTA) 100-25 MCG/INH AEPB Inhale 1 puff into the lungs daily.    . Glycerin-Hypromellose-PEG 400 ([employer] DRY EYE RELIEF OP) Apply 1 drop to eye at bedtime as needed (dry eyes).    . IRON PO Take 1 tablet by mouth daily.    Marland Kitchen lamoTRIgine (LAMICTAL) 100 MG tablet Take 1 tablet (100 mg total) by mouth 2 (two) times daily. 180 tablet 1  . magnesium gluconate (MAGONATE) 500 MG tablet Take 500  mg by mouth daily.    . Multiple Vitamins-Minerals (COMPLETE MULTIVITAMIN/MINERAL PO) Take 1 tablet by mouth daily.    . pantoprazole (PROTONIX) 40 MG tablet Take 40 mg by mouth daily.    Marland Kitchen topiramate (TOPAMAX) 200 MG tablet Take 1 tablet (200 mg total) by mouth daily. (Patient taking differently: Take 50 mg by mouth daily. ) 90 tablet 1  . traZODone (DESYREL) 100 MG tablet Take 100 mg by mouth at bedtime as needed for sleep.     No current facility-administered medications for this visit.    Medication Side Effects: None  Allergies:  Allergies  Allergen Reactions  . Dilaudid [Hydromorphone Hcl] Other (See Comments)    Nightmares  . Divalproex Sodium     Became comatose when she took Depakote and Geodon together.  . Nsaids     "kidney problem"  . Perphenazine-Amitriptyline Itching  . Seroquel [Quetiapine Fumerate]     Past Medical History:  Diagnosis Date  . Anxiety   . Asthma   . Bipolar disorder ([place])   . Cataracts, bilateral   . Depression   . GERD (gastroesophageal reflux disease)   . Hydatid cyst    Left  . Hypertension   . Kidney problem    RT kidney is smaller -> decreased output  . Migraine   . Osteoporosis 11/2016   T score -2.8  improved from prior DEXA  . Right rotator cuff tear   . Vertigo     Family History  Problem Relation Age of Onset  . Diabetes Father   . Hypertension Father   . Heart disease Father   . Stroke Father   . Cancer Father        Lymphoma  . Hypertension Mother   . Heart disease Mother   . Stroke Mother   . Stroke Paternal Uncle   . Heart disease Brother     Social History   Socioeconomic History  . Marital status: Divorced    Spouse name: Not on file  . Number of children: 1  . Years of education: MSN  . Highest education level: Not on file  Occupational History  . Not on file  Tobacco Use  . Smoking status: Never Smoker  . Smokeless tobacco: Never Used  Vaping Use  . Vaping Use: Never used  Substance and Sexual  Activity  . Alcohol use: Yes    Alcohol/week: 0.0 standard drinks    Comment: Rare  . Drug use: No  . Sexual activity: Not Currently    Birth control/protection: Post-menopausal    Comment: 1st intercourse 74 yo-2 partners  Other Topics Concern  . Not on file  Social History Narrative   Lives with husband and daughter   Caffeine use: 2 cups per day   Right handed   Social Determinants of Health   Financial Resource Strain:   . Difficulty of Paying Living Expenses: Not on file  Food Insecurity:   . Worried About Charity fundraiser in the Last Year: Not on file  . Ran Out of Food in the Last Year: Not on file  Transportation Needs:   . Lack of Transportation (Medical): Not on file  . Lack of Transportation (Non-Medical): Not on file  Physical Activity:   . Days of Exercise per Week: Not on file  . Minutes of Exercise per Session: Not on file  Stress:   . Feeling of Stress : Not on file  Social Connections:   . Frequency of Communication with Friends and Family: Not on file  . Frequency of Social Gatherings with Friends and Family: Not on file  . Attends Religious Services: Not on file  . Active Member of Clubs or Organizations: Not on file  . Attends Archivist Meetings: Not on file  . Marital Status: Not on file  Intimate Partner Violence:   . Fear of Current or Ex-Partner: Not on file  . Emotionally Abused: Not on file  . Physically Abused: Not on file  . Sexually Abused: Not on file    Past Medical History, Surgical history, Social history, and Family history were reviewed and updated as appropriate.   Please see review of systems for further details on the patient's review from today.   Objective:   Physical Exam:  There were no vitals taken for this visit.  Physical Exam Constitutional:      General: She is not in acute distress. Musculoskeletal:  General: No deformity.  Neurological:     Mental Status: She is alert and oriented to  person, place, and time.     Coordination: Coordination normal.  Psychiatric:        Attention and Perception: Attention and perception normal. She does not perceive auditory or visual hallucinations.        Mood and Affect: Mood is anxious. Mood is not depressed. Affect is not labile, blunt, angry or inappropriate.        Speech: Speech normal.        Behavior: Behavior normal.        Thought Content: Thought content normal. Thought content is not paranoid or delusional. Thought content does not include homicidal or suicidal ideation. Thought content does not include homicidal or suicidal plan.        Cognition and Memory: Cognition and memory normal.        Judgment: Judgment normal.     Comments: Insight intact Not suicidal.       Lab Review:     Component Value Date/Time   NA 137 01/02/2020 1122   K 3.8 01/02/2020 1122   CL 105 01/02/2020 1122   CO2 23 01/02/2020 1122   GLUCOSE 114 (H) 01/02/2020 1122   BUN 27 (H) 01/02/2020 1122   CREATININE 1.58 (H) 01/02/2020 1122   CALCIUM 9.3 01/02/2020 1122   PROT 8.4 (H) 01/02/2020 1122   ALBUMIN 5.0 01/02/2020 1122   AST 28 01/02/2020 1122   ALT 19 01/02/2020 1122   ALKPHOS 91 01/02/2020 1122   BILITOT 0.7 01/02/2020 1122   GFRNONAA 33 (L) 01/02/2020 1122   GFRAA 39 (L) 01/02/2020 1122       Component Value Date/Time   WBC 6.0 01/02/2020 1122   RBC 4.51 01/02/2020 1122   HGB 13.6 01/02/2020 1122   HCT 40.5 01/02/2020 1122   PLT 210 01/02/2020 1122   MCV 89.8 01/02/2020 1122   MCH 30.2 01/02/2020 1122   MCHC 33.6 01/02/2020 1122   RDW 11.9 01/02/2020 1122   LYMPHSABS 0.8 01/02/2020 1122   MONOABS 0.3 01/02/2020 1122   EOSABS 0.1 01/02/2020 1122   BASOSABS 0.0 01/02/2020 1122    No results found for: POCLITH, LITHIUM   No results found for: PHENYTOIN, PHENOBARB, VALPROATE, CBMZ   .res Assessment: Plan:    Derrian was seen today for follow-up, depression and other.  Diagnoses and all orders for this visit:  Mixed  bipolar II disorder with rapid cycling (HCC) -     lamoTRIgine (LAMICTAL) 100 MG tablet; Take 1 tablet (100 mg total) by mouth 2 (two) times daily.  Generalized anxiety disorder  Insomnia due to mental condition    Patient with a long history of bipolar disorder with rapid cycling that has been somewhat difficult to treat for a variety of reasons.  These include confounding migraine headaches which require a lot of medication and the fact that she is medication sensitive as well as having failed multiple medications.  In addition she refuses meds that can cause weight gain which greatly limit options.  2021 summer she was hospitalized which was originally attributed to an intentional overdose which the patient strongly denies.  There is no evidence that she intentionally overdosed or had any suicidal intent.  She is not currently suicidal and has not been her recent history.  She has been very overwhelmed with caring for her mother's estate by herself and stressed by grief.  She has started counseling which is helpful.  She  has recently reduced a number of the anticonvulsants that were used to help her migraine headaches.   She is not currently confused.  She is not a acutely depressed but more stressed.  She is not manic.  She has no psychosis.  There is no evidence of substance abuse. Off HA meds several months and doing OK.  She has had some recent anxiety and has decided to stay on the clonazepam and restart lamotrigine.  She is not having any manic or depressive symptoms at this time.  We discussed the short-term risks associated with benzodiazepines including sedation and increased fall risk among others.  Discussed long-term side effect risk including dependence, potential withdrawal symptoms, and the potential eventual dose-related risk of dementia.  But recent studies from 2020 dispute this association between benzodiazepines and dementia risk. Newer studies in 2020 do not support an  association with dementia. Contingue clonazepam Taking 1.5 mg HS. It has some mood stabilizing effects and she has restarted the lamotrigine 100 mg twice daily.  She agrees to continue these 2 medications.  She agrees.  Emphasized need to stay on mood stabilizer but she refuses more at this time. Disc Caplyta off label.  This is a 30 min appt  No other med changes today.  FU 8 weeks  Lynder Parents MD, DFAPA  Please see After Visit Summary for patient specific instructions.  No future appointments.  No orders of the defined types were placed in this encounter.   -------------------------------

## 2020-04-12 ENCOUNTER — Ambulatory Visit: Payer: Medicare Other | Admitting: Psychiatry

## 2020-04-20 ENCOUNTER — Other Ambulatory Visit (HOSPITAL_COMMUNITY): Payer: Self-pay | Admitting: Psychiatry

## 2020-04-30 ENCOUNTER — Other Ambulatory Visit: Payer: Self-pay | Admitting: Psychiatry

## 2020-04-30 DIAGNOSIS — F411 Generalized anxiety disorder: Secondary | ICD-10-CM

## 2020-06-05 ENCOUNTER — Telehealth: Payer: Self-pay | Admitting: Psychiatry

## 2020-06-05 NOTE — Telephone Encounter (Signed)
Pt called and is worried about long term use of clonazapam. She has heard that it could cause alzheimers and dementia. She wants to [facility] if you recommend a ct scan for her to do to make sure she doesn't have any damage. She has an appointment on 07/18/2020. Please call her at [phone]

## 2020-07-18 ENCOUNTER — Other Ambulatory Visit: Payer: Self-pay

## 2020-07-18 ENCOUNTER — Encounter: Payer: Self-pay | Admitting: Psychiatry

## 2020-07-18 ENCOUNTER — Ambulatory Visit (INDEPENDENT_AMBULATORY_CARE_PROVIDER_SITE_OTHER): Payer: Medicare Other | Admitting: Psychiatry

## 2020-07-18 DIAGNOSIS — F411 Generalized anxiety disorder: Secondary | ICD-10-CM | POA: Diagnosis not present

## 2020-07-18 DIAGNOSIS — F3181 Bipolar II disorder: Secondary | ICD-10-CM | POA: Diagnosis not present

## 2020-07-18 DIAGNOSIS — F5105 Insomnia due to other mental disorder: Secondary | ICD-10-CM

## 2020-07-18 DIAGNOSIS — G43009 Migraine without aura, not intractable, without status migrainosus: Secondary | ICD-10-CM | POA: Diagnosis not present

## 2020-07-18 MED ORDER — LAMOTRIGINE 100 MG PO TABS: 100.0000 mg | ORAL_TABLET | Freq: Two times a day (BID) | ORAL | 1 refills | Status: DC

## 2020-07-18 MED ORDER — CLONAZEPAM 0.5 MG PO TABS: 0.5000 mg | ORAL_TABLET | Freq: Three times a day (TID) | ORAL | 1 refills | Status: DC | PRN

## 2020-07-18 NOTE — Progress Notes (Signed)
Kelli Morales [id number] 1951/05/09 70 y.o.   Subjective:   Patient ID:  Kelli Morales is a 70 y.o. (DOB 1950/08/17) female.  Chief Complaint:  Chief Complaint  Patient presents with  . Follow-up  . Mixed bipolar II disorder with rapid cycling ([place])  . Sleeping Problem    Depression        Kelli Morales presents to the office today for follow-up of bipolar disorder.  Recent hospitalization July 2021 for alleged intentional overdose of medications.  Discharged 01/02/2020 on clonazepam 0.5 mg 3 times daily as needed, lamotrigine 400 mg twice daily, topiramate 200 mg nightly, trazodone 300 mg nightly, and ziprasidone 40 mg twice daily, and zonisamide 300 mg daily.  12/01/2019 appointment notable for patient having seen significant mood stability and reduction of manic symptoms and depression with an increase in lamotrigine and the addition of Geodon 20 mg twice daily.  No meds were changed.  01/18/20 appt with the following noted: Disc recent hospitalization.  Said she felt weak the day of hospitalization and grief and got distraught.  She denied over taking meds and got pulled over by police for driving erratically.   She denies intentional overdose but she was dehydrated. Upset over being at behavioral health hosp of 2 days. Lamotrigine reduced to 1 daily. Reduced Zonesimide gradually per MD. Reduced topiramate to 100 mg daily. Says she ate normally that day.   Says she's always been sensitive to Geodon but is doing OK with it right now.  No other explanation for what happened that day except she was distraught and overwhelmed with mother's estate. Started grief therapy [facility]. Plan no med changes as recently hospitalized  02/10/2020 appt with the following noted: CO weight gain and thinks it is Geodon.  Gained weight gain on Seroquel and hasn't lost it.  Wants to wean off clonazepam.  Has cut so many meds back dramatically. BP has come back to normal. I feel really good.   Calmer and more focused.  No brain fog.  No sadness.  Don't get as hyper.  Anxiety not out of control.  Better overall. No HA.  Meditation and therapy and it helped to wean off migraine meds.   Now more ready to let go of clonazepam.   Sleep variable.  Get wound up at work and can have a hard time sleeping but not too bad. Plan: Disc her desire to taper clonazepam.  Need to protect sleep.  Taking 1.5 mg HS. Reduce no faster than 0.25 mg reduction per month.   02/22/2020 phone call from patient complaining of 10 pound weight gain since January 04, 2020 when starting Geodon 40 mg twice daily. MD response:She has tried all of the mood stabilizers with the lowest weight gain risk.  She is not overweight.  But she is very focused on her weight.  The only alternative I see that she is not taking with low weight gain risk is haloperidol and typically ziprasidone or Geodon has even lower weight gain risk than haloperidol but there is individual variation. She had a psychiatric hospitalization on Geodon 20 mg twice daily so I would not be in favor of reducing the dose below Geodon 40 mg twice daily.  03/19/20 appt with following noted: Took herself off the Geodon bc jitters and weight gain. Restarted lamotrigine at 100 mg BID. Has gained from 128# to 155 # and she attributes to meds including quetiapine and perphenazine. Some anxiety.  Still on clonazepam 1.5 mg daily.  Occ trazodone prn. Patient reports stable mood and denies depressed or irritable moods.   Patient denies difficulty with sleep initiation or maintenance. Denies appetite disturbance.  Patient reports that energy and motivation have been good.  Patient denies any difficulty with concentration.  Patient denies any suicidal ideation.  07/18/2020 appt with following noted: New job end of December.  Doing well with it. Agency directoer and DON. Ongoing problem with weight gain 14#/6 mos.  CO worse.  Doesn't know why.  On [employer], walking.  Taking  clonazepam 1.5 mg HS. "Probably not enough sleep", but sleeps 8-4.  Same sleep pattern for several years. No sig periods of hypomania except brief after mother's death. Patient reports stable mood and denies depressed or irritable moods.  Patient denies any recent difficulty with anxiety.  Patient denies difficulty with sleep initiation or maintenance. Denies appetite disturbance.  Patient reports that energy and motivation have been good.  Patient denies any difficulty with concentration.  Patient denies any suicidal ideation.  Past Psychiatric Medication Trials: Vraylar early weight gain.  Perphenazine 8 mg, quetiapine 300 mg, Abilify 10,  Geodon 40 BID,  Risperidone, latuda 5# weight gain Lithium, Depakote, CBZ remotely but this is uncertain, , CBZ 035 sleepiness. Lamotrigine 300  Mirtazapine, paroxetine, Lexapro, sertraline, duloxetine 120,  Psychiatric hospitalizations summer 2021  Review of Systems:  Review of Systems  Constitutional: Positive for unexpected weight change.  Cardiovascular: Negative for palpitations.  Neurological: Negative for dizziness, tremors, weakness and light-headedness.  Psychiatric/Behavioral: Positive for depression.    Medications: I have reviewed the patient's current medications.  Current Outpatient Medications  Medication Sig Dispense Refill  . albuterol (VENTOLIN HFA) 108 (90 Base) MCG/ACT inhaler     . amLODipine (NORVASC) 10 MG tablet Take 1 tablet (10 mg total) by mouth daily. 30 tablet 0  . Ascorbic Acid (VITAMIN C PO) Take 1 tablet by mouth daily.    Marland Kitchen CALCIUM PO Take 1 tablet by mouth daily.    . cholecalciferol (VITAMIN D) 1000 UNITS tablet Take 1,000 Units by mouth 2 (two) times daily.     . Cyanocobalamin (VITAMIN B 12 PO) Take by mouth.    . denosumab (PROLIA) 60 MG/ML SOLN injection Inject 60 mg into the skin every 6 (six) months. Administer in upper arm, thigh, or abdomen    . fish oil-omega-3 fatty acids 1000 MG capsule Take 1 g by  mouth daily.    . fluticasone (FLONASE) 50 MCG/ACT nasal spray Place 2 sprays into the nose daily.    . fluticasone furoate-vilanterol (BREO ELLIPTA) 100-25 MCG/INH AEPB Inhale 1 puff into the lungs daily.    . Glycerin-Hypromellose-PEG 400 ([employer] DRY EYE RELIEF OP) Apply 1 drop to eye at bedtime as needed (dry eyes).    . IRON PO Take 1 tablet by mouth daily.    . magnesium gluconate (MAGONATE) 500 MG tablet Take 500 mg by mouth daily.    . Multiple Vitamins-Minerals (COMPLETE MULTIVITAMIN/MINERAL PO) Take 1 tablet by mouth daily.    . pantoprazole (PROTONIX) 40 MG tablet Take 40 mg by mouth daily.    Marland Kitchen topiramate (TOPAMAX) 200 MG tablet Take 1 tablet (200 mg total) by mouth daily. (Patient taking differently: Take 50 mg by mouth daily.) 90 tablet 1  . traZODone (DESYREL) 100 MG tablet Take 100 mg by mouth at bedtime as needed for sleep.    . clonazePAM (KLONOPIN) 0.5 MG tablet Take 1 tablet (0.5 mg total) by mouth 3 (three) times daily as needed for  anxiety (and insomnia). 270 tablet 1  . lamoTRIgine (LAMICTAL) 100 MG tablet Take 1 tablet (100 mg total) by mouth 2 (two) times daily. 180 tablet 1   No current facility-administered medications for this visit.    Medication Side Effects: None  Allergies:  Allergies  Allergen Reactions  . Dilaudid [Hydromorphone Hcl] Other (See Comments)    Nightmares  . Divalproex Sodium     Became comatose when she took Depakote and Geodon together.  . Nsaids     "kidney problem"  . Perphenazine-Amitriptyline Itching  . Seroquel [Quetiapine Fumerate]     Past Medical History:  Diagnosis Date  . Anxiety   . Asthma   . Bipolar disorder ([place])   . Cataracts, bilateral   . Depression   . GERD (gastroesophageal reflux disease)   . Hydatid cyst    Left  . Hypertension   . Kidney problem    RT kidney is smaller -> decreased output  . Migraine   . Osteoporosis 11/2016   T score -2.8  improved from prior DEXA  . Right rotator cuff tear   . Vertigo      Family History  Problem Relation Age of Onset  . Diabetes Father   . Hypertension Father   . Heart disease Father   . Stroke Father   . Cancer Father        Lymphoma  . Hypertension Mother   . Heart disease Mother   . Stroke Mother   . Stroke Paternal Uncle   . Heart disease Brother     Social History   Socioeconomic History  . Marital status: Divorced    Spouse name: Not on file  . Number of children: 1  . Years of education: MSN  . Highest education level: Not on file  Occupational History  . Not on file  Tobacco Use  . Smoking status: Never Smoker  . Smokeless tobacco: Never Used  Vaping Use  . Vaping Use: Never used  Substance and Sexual Activity  . Alcohol use: Yes    Alcohol/week: 0.0 standard drinks    Comment: Rare  . Drug use: No  . Sexual activity: Not Currently    Birth control/protection: Post-menopausal    Comment: 1st intercourse 3 yo-2 partners  Other Topics Concern  . Not on file  Social History Narrative   Lives with husband and daughter   Caffeine use: 2 cups per day   Right handed   Social Determinants of Health   Financial Resource Strain: Not on file  Food Insecurity: Not on file  Transportation Needs: Not on file  Physical Activity: Not on file  Stress: Not on file  Social Connections: Not on file  Intimate Partner Violence: Not on file    Past Medical History, Surgical history, Social history, and Family history were reviewed and updated as appropriate.   Please see review of systems for further details on the patient's review from today.   Objective:   Physical Exam:  There were no vitals taken for this visit.  Physical Exam Constitutional:      General: She is not in acute distress. Musculoskeletal:        General: No deformity.  Neurological:     Mental Status: She is alert and oriented to person, place, and time.     Coordination: Coordination normal.  Psychiatric:        Attention and Perception: Attention  and perception normal. She does not perceive auditory or visual hallucinations.  Mood and Affect: Mood is not anxious or depressed. Affect is not labile, blunt, angry or inappropriate.        Speech: Speech normal.        Behavior: Behavior normal.        Thought Content: Thought content normal. Thought content is not paranoid or delusional. Thought content does not include homicidal or suicidal ideation. Thought content does not include homicidal or suicidal plan.        Cognition and Memory: Cognition and memory normal.        Judgment: Judgment normal.     Comments: Insight intact Not suicidal.       Lab Review:     Component Value Date/Time   NA 137 01/02/2020 1122   K 3.8 01/02/2020 1122   CL 105 01/02/2020 1122   CO2 23 01/02/2020 1122   GLUCOSE 114 (H) 01/02/2020 1122   BUN 27 (H) 01/02/2020 1122   CREATININE 1.58 (H) 01/02/2020 1122   CALCIUM 9.3 01/02/2020 1122   PROT 8.4 (H) 01/02/2020 1122   ALBUMIN 5.0 01/02/2020 1122   AST 28 01/02/2020 1122   ALT 19 01/02/2020 1122   ALKPHOS 91 01/02/2020 1122   BILITOT 0.7 01/02/2020 1122   GFRNONAA 33 (L) 01/02/2020 1122   GFRAA 39 (L) 01/02/2020 1122       Component Value Date/Time   WBC 6.0 01/02/2020 1122   RBC 4.51 01/02/2020 1122   HGB 13.6 01/02/2020 1122   HCT 40.5 01/02/2020 1122   PLT 210 01/02/2020 1122   MCV 89.8 01/02/2020 1122   MCH 30.2 01/02/2020 1122   MCHC 33.6 01/02/2020 1122   RDW 11.9 01/02/2020 1122   LYMPHSABS 0.8 01/02/2020 1122   MONOABS 0.3 01/02/2020 1122   EOSABS 0.1 01/02/2020 1122   BASOSABS 0.0 01/02/2020 1122    No results found for: POCLITH, LITHIUM   No results found for: PHENYTOIN, PHENOBARB, VALPROATE, CBMZ   .res Assessment: Plan:    Kelli Morales was seen today for follow-up, mixed bipolar ii disorder with rapid cycling ([facility]) and sleeping problem.  Diagnoses and all orders for this visit:  Mixed bipolar II disorder with rapid cycling ([facility]) -     lamoTRIgine (LAMICTAL) 100  MG tablet; Take 1 tablet (100 mg total) by mouth 2 (two) times daily.  Generalized anxiety disorder -     clonazePAM (KLONOPIN) 0.5 MG tablet; Take 1 tablet (0.5 mg total) by mouth 3 (three) times daily as needed for anxiety (and insomnia).  Insomnia due to mental condition  Migraine without aura and without status migrainosus, not intractable    Patient with a long history of bipolar disorder with rapid cycling that has been somewhat difficult to treat for a variety of reasons.  These include confounding migraine headaches which require a lot of medication and the fact that she is medication sensitive as well as having failed multiple medications.  In addition she refuses meds that can cause weight gain which greatly limit options.  2021 summer she was hospitalized which was originally attributed to an intentional overdose which the patient strongly denies.  There is no evidence that she intentionally overdosed or had any suicidal intent.  She is not currently suicidal and has not been her recent history.  She has been very overwhelmed with caring for her mother's estate by herself and stressed by grief.  She has started counseling which is helpful.  She has recently reduced a number of the anticonvulsants that were used to help her migraine  headaches.   She is not currently confused.  She is not a acutely depressed but more stressed.  She is not manic.  She has no psychosis.  There is no evidence of substance abuse. Off HA meds several months and doing OK.  She has had some recent anxiety and has decided to stay on the clonazepam and restart lamotrigine.  She is not having any manic or depressive symptoms at this time.  We discussed the short-term risks associated with benzodiazepines including sedation and increased fall risk among others.  Discussed long-term side effect risk including dependence, potential withdrawal symptoms, and the potential eventual dose-related risk of dementia.  But  recent studies from 2020 dispute this association between benzodiazepines and dementia risk. Newer studies in 2020 do not support an association with dementia.  Disc this again with detail.  Disc try gradually reduce clonazepam.  Contingue clonazepam Taking 1.5 mg HS. It has some mood stabilizing effects and she has restarted the lamotrigine 100 mg twice daily.  She agrees to continue these 2 medications.  She agrees.  Emphasized need to stay on mood stabilizer but she refuses more at this time. Disc Caplyta off label.   Disc poss Ozempic for weight loss.  Talk to her PCP  She's very concerned about weight.  This is a 30 min appt  No other med changes today.  FU 4-6 mos  Lynder Parents MD, DFAPA  Please see After Visit Summary for patient specific instructions.  No future appointments.  No orders of the defined types were placed in this encounter.   -------------------------------

## 2020-07-18 NOTE — Patient Instructions (Signed)
Ozempic or Wegovy for weight loss

## 2020-08-02 DIAGNOSIS — M810 Age-related osteoporosis without current pathological fracture: Secondary | ICD-10-CM | POA: Diagnosis not present

## 2020-08-02 DIAGNOSIS — I10 Essential (primary) hypertension: Secondary | ICD-10-CM | POA: Diagnosis not present

## 2020-08-27 DIAGNOSIS — Z713 Dietary counseling and surveillance: Secondary | ICD-10-CM | POA: Diagnosis not present

## 2020-08-29 DIAGNOSIS — G43719 Chronic migraine without aura, intractable, without status migrainosus: Secondary | ICD-10-CM | POA: Diagnosis not present

## 2020-08-29 DIAGNOSIS — G43019 Migraine without aura, intractable, without status migrainosus: Secondary | ICD-10-CM | POA: Diagnosis not present

## 2020-09-24 ENCOUNTER — Ambulatory Visit: Payer: Medicare Other | Admitting: Nurse Practitioner

## 2020-10-01 ENCOUNTER — Encounter: Payer: Self-pay | Admitting: Nurse Practitioner

## 2020-10-01 ENCOUNTER — Other Ambulatory Visit: Payer: Self-pay

## 2020-10-01 ENCOUNTER — Ambulatory Visit (INDEPENDENT_AMBULATORY_CARE_PROVIDER_SITE_OTHER): Payer: Medicare Other | Admitting: Nurse Practitioner

## 2020-10-01 VITALS — BP 114/66 | Ht 65.0 in | Wt 165.0 lb

## 2020-10-01 DIAGNOSIS — M810 Age-related osteoporosis without current pathological fracture: Secondary | ICD-10-CM

## 2020-10-01 DIAGNOSIS — Z01419 Encounter for gynecological examination (general) (routine) without abnormal findings: Secondary | ICD-10-CM | POA: Diagnosis not present

## 2020-10-01 DIAGNOSIS — Z780 Asymptomatic menopausal state: Secondary | ICD-10-CM

## 2020-10-01 NOTE — Patient Instructions (Signed)
Health Maintenance After Age 70 After age 70, you are at a higher risk for certain long-term diseases and infections as well as injuries from falls. Falls are a major cause of broken bones and head injuries in people who are older than age 70. Getting regular preventive care can help to keep you healthy and well. Preventive care includes getting regular testing and making lifestyle changes as recommended by your health care provider. Talk with your health care provider about:  Which screenings and tests you should have. A screening is a test that checks for a disease when you have no symptoms.  A diet and exercise plan that is right for you. What should I know about screenings and tests to prevent falls? Screening and testing are the best ways to find a health problem early. Early diagnosis and treatment give you the best chance of managing medical conditions that are common after age 70. Certain conditions and lifestyle choices may make you more likely to have a fall. Your health care provider may recommend:  Regular vision checks. Poor vision and conditions such as cataracts can make you more likely to have a fall. If you wear glasses, make sure to get your prescription updated if your vision changes.  Medicine review. Work with your health care provider to regularly review all of the medicines you are taking, including over-the-counter medicines. Ask your health care provider about any side effects that may make you more likely to have a fall. Tell your health care provider if any medicines that you take make you feel dizzy or sleepy.  Osteoporosis screening. Osteoporosis is a condition that causes the bones to get weaker. This can make the bones weak and cause them to break more easily.  Blood pressure screening. Blood pressure changes and medicines to control blood pressure can make you feel dizzy.  Strength and balance checks. Your health care provider may recommend certain tests to check your  strength and balance while standing, walking, or changing positions.  Foot health exam. Foot pain and numbness, as well as not wearing proper footwear, can make you more likely to have a fall.  Depression screening. You may be more likely to have a fall if you have a fear of falling, feel emotionally low, or feel unable to do activities that you used to do.  Alcohol use screening. Using too much alcohol can affect your balance and may make you more likely to have a fall. What actions can I take to lower my risk of falls? General instructions  Talk with your health care provider about your risks for falling. Tell your health care provider if: ? You fall. Be sure to tell your health care provider about all falls, even ones that seem minor. ? You feel dizzy, sleepy, or off-balance.  Take over-the-counter and prescription medicines only as told by your health care provider. These include any supplements.  Eat a healthy diet and maintain a healthy weight. A healthy diet includes low-fat dairy products, low-fat (lean) meats, and fiber from whole grains, beans, and lots of fruits and vegetables. Home safety  Remove any tripping hazards, such as rugs, cords, and clutter.  Install safety equipment such as grab bars in bathrooms and safety rails on stairs.  Keep rooms and walkways well-lit. Activity  Follow a regular exercise program to stay fit. This will help you maintain your balance. Ask your health care provider what types of exercise are appropriate for you.  If you need a cane or walker,   use it as recommended by your health care provider.  Wear supportive shoes that have nonskid soles.   Lifestyle  Do not drink alcohol if your health care provider tells you not to drink.  If you drink alcohol, limit how much you have: ? 0-1 drink a day for women. ? 0-2 drinks a day for men.  Be aware of how much alcohol is in your drink. In the [place] one drink equals one typical bottle of beer (12  oz), one-half glass of wine (5 oz), or one shot of hard liquor (1 oz).  Do not use any products that contain nicotine or tobacco, such as cigarettes and e-cigarettes. If you need help quitting, ask your health care provider. Summary  Having a healthy lifestyle and getting preventive care can help to protect your health and wellness after age 70.  Screening and testing are the best way to find a health problem early and help you avoid having a fall. Early diagnosis and treatment give you the best chance for managing medical conditions that are more common for people who are older than age 70.  Falls are a major cause of broken bones and head injuries in people who are older than age 70. Take precautions to prevent a fall at home.  Work with your health care provider to learn what changes you can make to improve your health and wellness and to prevent falls. This information is not intended to replace advice given to you by your health care provider. Make sure you discuss any questions you have with your health care provider. Document Revised: 09/16/2018 Document Reviewed: 04/08/2017 Elsevier Patient Education  2021 Elsevier Inc.  

## 2020-10-01 NOTE — Progress Notes (Signed)
   Kelli Morales 07/10/1950 [id number]   History:  70 y.o. G2P0011 presents for breast and pelvic exam. No GYN complaints. Postmenopausal - no HRT, no bleeding. Normal pap and mammogram history. On Prolia for osteoporosis, stable, managed by PCP. Bipolar disorder and anxiety managed by psych.   Gynecologic History No LMP recorded. Patient is postmenopausal.   Contraception: post menopausal status  Health Maintenance Last Pap: 04/2018. Results were: normal Last mammogram: 2021 per patient. Results were: normal Last colonoscopy: 2015. Results: normal Last Dexa: 2021 per patient. Results were: stable per patient, on Prolia managed by PCP  Past medical history, past surgical history, family history and social history were all reviewed and documented in the EPIC chart. [facility].   ROS:  A ROS was performed and pertinent positives and negatives are included.  Exam:  Vitals:   10/01/20 1609  BP: 114/66  Weight: 165 lb (74.8 kg)  Height: 5\' 5"  (1.651 m)   Body mass index is 27.46 kg/m.  General appearance:  Normal Thyroid:  Symmetrical, normal in size, without palpable masses or nodularity. Respiratory  Auscultation:  Clear without wheezing or rhonchi Cardiovascular  Auscultation:  Regular rate, without rubs, murmurs or gallops  Edema/varicosities:  Not grossly evident Abdominal  Soft,nontender, without masses, guarding or rebound.  Liver/spleen:  No organomegaly noted  Hernia:  None appreciated  Skin  Inspection:  Grossly normal Breasts: Examined lying and sitting.   Right: Without masses, retractions, nipple discharge or axillary adenopathy.   Left: Without masses, retractions, nipple discharge or axillary adenopathy. Gentitourinary   Inguinal/mons:  Normal without inguinal adenopathy  External genitalia:  Normal appearing vulva with no masses, tenderness, or lesions  BUS/Urethra/Skene's glands:  Normal  Vagina:  Normal appearing with normal color  and discharge, no lesions. Atrophic changes  Cervix:  Normal appearing without discharge or lesions  Uterus:  Normal in size, shape and contour.  Midline and mobile, nontender  Adnexa/parametria:     Rt: Normal in size, without masses or tenderness.   Lt: Normal in size, without masses or tenderness.  Anus and perineum: Normal  Digital rectal exam: Normal sphincter tone without palpated masses or tenderness  Assessment/Plan:  70 y.o. G2P0011 for breast and pelvic exam.   Well female exam with routine gynecological exam - Education provided on SBEs, importance of preventative screenings, current guidelines, high calcium diet, regular exercise, and multivitamin daily. Labs with PCP.   Postmenopausal - no HRT, no bleeding.   Age-related osteoporosis without current pathological fracture - managed by PCP. On prolia, stable. Reports most recent bone density was last year but we do not have this report. Recommend regular exercise and daily vitamin D supplement.   Screening for cervical cancer - Normal Pap history.  Discussed current guidelines and the option to stop screening. She had a friend pass from cervical cancer and would like to continue. We will repeat next year at 3-year interval.   Screening for breast cancer - Normal mammogram history. Continue annual screenings. Normal breast exam today.  Screening for colon cancer - Normal colonoscopy in 2015. No more preventative screenings per GI recommendation.   Return in 1 year for annual.      Tamela Gammon DNP, 4:38 PM 10/01/2020

## 2020-10-22 DIAGNOSIS — D631 Anemia in chronic kidney disease: Secondary | ICD-10-CM | POA: Diagnosis not present

## 2020-10-22 DIAGNOSIS — N2581 Secondary hyperparathyroidism of renal origin: Secondary | ICD-10-CM | POA: Diagnosis not present

## 2020-10-22 DIAGNOSIS — I129 Hypertensive chronic kidney disease with stage 1 through stage 4 chronic kidney disease, or unspecified chronic kidney disease: Secondary | ICD-10-CM | POA: Diagnosis not present

## 2020-10-22 DIAGNOSIS — N183 Chronic kidney disease, stage 3 unspecified: Secondary | ICD-10-CM | POA: Diagnosis not present

## 2020-10-22 DIAGNOSIS — K219 Gastro-esophageal reflux disease without esophagitis: Secondary | ICD-10-CM | POA: Diagnosis not present

## 2020-10-22 DIAGNOSIS — N189 Chronic kidney disease, unspecified: Secondary | ICD-10-CM | POA: Diagnosis not present

## 2020-10-22 DIAGNOSIS — N261 Atrophy of kidney (terminal): Secondary | ICD-10-CM | POA: Diagnosis not present

## 2020-10-28 ENCOUNTER — Other Ambulatory Visit: Payer: Self-pay | Admitting: Psychiatry

## 2020-10-28 DIAGNOSIS — F3181 Bipolar II disorder: Secondary | ICD-10-CM

## 2020-10-29 DIAGNOSIS — Z713 Dietary counseling and surveillance: Secondary | ICD-10-CM | POA: Diagnosis not present

## 2020-10-29 DIAGNOSIS — Z833 Family history of diabetes mellitus: Secondary | ICD-10-CM | POA: Diagnosis not present

## 2020-10-31 DIAGNOSIS — H04123 Dry eye syndrome of bilateral lacrimal glands: Secondary | ICD-10-CM | POA: Diagnosis not present

## 2020-10-31 DIAGNOSIS — H26493 Other secondary cataract, bilateral: Secondary | ICD-10-CM | POA: Diagnosis not present

## 2020-10-31 DIAGNOSIS — H5211 Myopia, right eye: Secondary | ICD-10-CM | POA: Diagnosis not present

## 2020-10-31 DIAGNOSIS — H52203 Unspecified astigmatism, bilateral: Secondary | ICD-10-CM | POA: Diagnosis not present

## 2020-12-18 DIAGNOSIS — Z20822 Contact with and (suspected) exposure to covid-19: Secondary | ICD-10-CM | POA: Diagnosis not present

## 2020-12-29 DIAGNOSIS — L509 Urticaria, unspecified: Secondary | ICD-10-CM | POA: Diagnosis not present

## 2020-12-29 DIAGNOSIS — T783XXA Angioneurotic edema, initial encounter: Secondary | ICD-10-CM | POA: Diagnosis not present

## 2021-01-01 DIAGNOSIS — Z1231 Encounter for screening mammogram for malignant neoplasm of breast: Secondary | ICD-10-CM | POA: Diagnosis not present

## 2021-01-17 ENCOUNTER — Other Ambulatory Visit: Payer: Self-pay | Admitting: Psychiatry

## 2021-01-17 DIAGNOSIS — F411 Generalized anxiety disorder: Secondary | ICD-10-CM

## 2021-01-22 DIAGNOSIS — N183 Chronic kidney disease, stage 3 unspecified: Secondary | ICD-10-CM | POA: Diagnosis not present

## 2021-01-22 DIAGNOSIS — R5383 Other fatigue: Secondary | ICD-10-CM | POA: Diagnosis not present

## 2021-01-22 DIAGNOSIS — I129 Hypertensive chronic kidney disease with stage 1 through stage 4 chronic kidney disease, or unspecified chronic kidney disease: Secondary | ICD-10-CM | POA: Diagnosis not present

## 2021-01-22 DIAGNOSIS — D509 Iron deficiency anemia, unspecified: Secondary | ICD-10-CM | POA: Diagnosis not present

## 2021-02-01 DIAGNOSIS — M810 Age-related osteoporosis without current pathological fracture: Secondary | ICD-10-CM | POA: Diagnosis not present

## 2021-02-07 ENCOUNTER — Ambulatory Visit (INDEPENDENT_AMBULATORY_CARE_PROVIDER_SITE_OTHER): Payer: Medicare Other | Admitting: Family Medicine

## 2021-02-07 ENCOUNTER — Other Ambulatory Visit: Payer: Self-pay

## 2021-02-07 ENCOUNTER — Encounter: Payer: Self-pay | Admitting: Family Medicine

## 2021-02-07 DIAGNOSIS — N183 Chronic kidney disease, stage 3 unspecified: Secondary | ICD-10-CM | POA: Diagnosis not present

## 2021-02-07 DIAGNOSIS — Z713 Dietary counseling and surveillance: Secondary | ICD-10-CM | POA: Diagnosis not present

## 2021-02-07 DIAGNOSIS — Z6829 Body mass index (BMI) 29.0-29.9, adult: Secondary | ICD-10-CM | POA: Diagnosis not present

## 2021-02-07 NOTE — Progress Notes (Signed)
Medical Nutrition Therapy Patient has completed 2nd booster of COVID-19 vaccine on 12/27/20. PCP Harlan Stains, MD - [place] [facility] Appt start time: 1430 end time: 64 (1 hour) Primary concerns today:  Referred by Harlan Stains, MD for medical nutrition therapy related to CKD stage 3.  Also has h/o HTN, osteoporosis, obesity, and Fe-deficiency  anemia .  Relevant history/background: Kelli Morales is frustrated that she has not been able to lose weight.  Efforts have included a trial of Wegovey, keto diet, personal trainer 4 X wk for 3 months.  Kelli Morales saw Dr. Dema Severin on 01/22/21 with a chief complaint of fatigue; feels tired during the day despite bedtime of 7:30 PM and gets up at 5:30 AM.  Although she is still supplementing vitamin D, her vit D on 11/22/19 was 90 ng/mL.  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  Kelli Morales feels she sometimes eats just b/c it is "time" for a meal vs. being hungry.  She said on Wednesdays, the one day a week when she eats lunch (out with coworkers), she feels uncomfortably full all afternoon.  Also admitted, however, that she is very hungry by dinner time, and sometimes does not feel in full control of her appetite.  Diet hx and food recall suggest that her usual intake is marginal for energy, which may partially explain her fatigue and feeling cold "all the time."  Also contributing to both of these symptoms may be her anemia.  She usually takes her ferrous sulfate with her protein shake, which includes milk (Ca source).    Learning Readiness: Ready  Usual eating pattern: 2 meals and 0-1 snack per day. Frequent foods and beverages: water, black coffee; protein (vegan pro shake; 21 g pro) shake, fruit, fish, chx, vegetables, eggs.   Avoided foods: nuts (had an inflammatory reaction since experience ~3 mo ago;  dislikes spicy foods; most breads and starchy foods.   Usual physical activity: occasionally walks 10 min on TM; has a session with a Physiological scientist at [place]. Sleep: Estimates 8-9 hrs per night.  Sleeps well, and seldom has trouble falling asleep.    24-hr recall suggests and intake of <1000 kcal: (Up at 5:30 AM) B (5:45 AM)-  1 c coffee, 10 oz water, 8 oz lemon water,  B (7 AM)-   Pro shake (pro powder, 1/3 c 2% milk, 8 strawberries)  160 Snk ( AM)-   --- L (12 PM)-  Restaurant: 1 c chx salad, let, tomatoes, lemon water  300 Snk (2 PM)-  1 c green tea D (7:30 PM)-  1 let&tom sandwich, 1 tbsp mayo, water    240 Snk ( PM)-  --- Typical day? Yes.   Although lunch out is usually once a week.    Nutritional Diagnosis:  NI-1.4 Inadequate energy intake As related to energy needs.  As evidenced by usual intake of <1000 kcal.  Handouts given during visit include: After-Visit Summary (AVS) Meal Planning form Goals Sheet  Demonstrated degree of understanding via:  Teach Back   Monitoring/Evaluation:  Dietary intake, exercise, and body weight in 4 week(s).

## 2021-02-07 NOTE — Patient Instructions (Addendum)
-   Take your iron supplement separate from any calcium source.  It is best absorbed with vitamin C on an empty stomach.   - Discontinue use of supplemental vitamin D for now.   - Complete the Meal Planning form provided today.    Carbohydrate includes starch, sugar, and fiber.  Of these, only sugar and starch raise blood glucose.  (Fiber is found in fruits, vegetables [especially skin, seeds, and stalks], whole grains, and beans), and is a desirable component of any carb choice.   Starchy (carb) foods: Bread, rice, pasta, potatoes, corn, cereal, grits, oatmeal, crackers, bagels, muffins, all baked goods.    Protein foods: Meat, fish, poultry, eggs, dairy foods, and beans such as pinto and kidney beans, as well as soy foods like edemame, fake meats, tofu, tempeh.  One oz of meat, cheese, fish, or 1 egg = ~7 g protein.   Goals:  Eat at least 3 REAL meals and 1 snack per day.  Eat breakfast within one hour of getting up.  Aim for no more than 5 hours between eating. A REAL meal (for lunch or dinner) includes at least some protein and vegetables.        - Make a list of lunch ideas you can use, such as beans with veg's or string cheese with veg's.    2. Do at least 15 min of cardio and 15 min of resistance exercise 3 X wk.    Document progress on above goals using your Goals Sheet provided today.    Questions:  [email or web address].    Follow-up Thursday, Sept 29 at 3:30 PM.

## 2021-02-27 DIAGNOSIS — Z23 Encounter for immunization: Secondary | ICD-10-CM | POA: Diagnosis not present

## 2021-02-27 DIAGNOSIS — M62838 Other muscle spasm: Secondary | ICD-10-CM | POA: Diagnosis not present

## 2021-03-07 ENCOUNTER — Ambulatory Visit (INDEPENDENT_AMBULATORY_CARE_PROVIDER_SITE_OTHER): Payer: Medicare Other | Admitting: Family Medicine

## 2021-03-07 ENCOUNTER — Other Ambulatory Visit: Payer: Self-pay

## 2021-03-07 DIAGNOSIS — Z6827 Body mass index (BMI) 27.0-27.9, adult: Secondary | ICD-10-CM

## 2021-03-07 DIAGNOSIS — N183 Chronic kidney disease, stage 3 unspecified: Secondary | ICD-10-CM | POA: Diagnosis not present

## 2021-03-07 DIAGNOSIS — Z713 Dietary counseling and surveillance: Secondary | ICD-10-CM | POA: Diagnosis not present

## 2021-03-07 NOTE — Progress Notes (Signed)
Medical Nutrition Therapy Patient has completed 2nd booster of COVID-19 vaccine on 12/27/20. PCP Harlan Stains, MD - [place] [facility] Appt start time: 1430 end time: 19 (1 hour) Primary concerns today:  Referred by Harlan Stains, MD for medical nutrition therapy related to CKD stage 3.  Also has h/o HTN, osteoporosis, obesity, and Fe-deficiency  anemia .  Relevant history/background: Kelli Morales is frustrated that she has not been able to lose weight.  Efforts have included a trial of Wegovey, keto diet, personal trainer 4 X wk for 3 months.  Kelli Morales saw Dr. Dema Severin on 01/22/21 with a chief complaint of fatigue; feels tired during the day despite bedtime of 7:30 PM and gets up at 5:30 AM.  Although she is still supplementing vitamin D, her vit D on 11/22/19 was 90 ng/mL.  Labs as of 01/22/21: TC  159 TG    37 HDL     93 LDL(calc)    58 Fe    48 (L) Fe sat     18 (L) Transferrin  191 (L)  Assessment:  Kelli Morales said she just can't eat lunch if she has breakfast; does not have an appetite then.  If she eats lunch, then skips dinner, she will wake up hungry ~3 AM, which happens as much as 2-3 X wk.  She even said she had tried eating ~2 cups of cooked veg's for lunch, but could not tolerate even this amount of food.  She is no longer going out for lunch with coworkers once a week b/c she is now working with a new home health company.  Kelli Morales' poor appetite is concerning, as it makes meeting her nutritional needs virtually impossible.  She agreed that it is not normal to be too full from 2 cups of broccoli to be able to eat dinner 5 or 6 hours later, and was receptive to trying to incorporate some mid-day food slowly and progressively.   She has been tracking progress on goals using her Goals Sheet, and she completed the Meal Planning form, which she has found helpful.    Learning Readiness: Ready  Usual eating pattern: 2 meals (usually skipping lunch) and 0-1 snack per day. Recent  physical activity: Using an exercise program app with 28-day program including different types ex, e.g., cardio, strength, balance, and body sculpting.  Has been doing 30-45 min 5 X wk.  On weekends she sometimes does 30-min workouts.  Not seeing personal trainer now.   Sleep: Estimates 8-9 hrs per night.  Sleeps well, and seldom has trouble falling asleep.    24-hr recall suggests intake of ~1000 kcal:  (Up at 7:30 AM) B (9:15 AM)-  1 pro shake (plant-based [place], 12 str'berries, 1/4 c blueber's, collagen protein, 1/3 c 2% milk; 30 g pro from supplements), 2 c coffee, water Snk (10:30)-  1 c black coffee      230+ L (1 PM)-  1 c black coffee Snk ( PM)-  --- D (6 PM)-  8-10 oz grilled trout, side caesar salad, 2 rolls, water 720 Snk ( PM)-  --- Typical day? Yes.    Previous estimated kcal intakes: 02/07/21:  1000  Nutritional Diagnosis: No progress on NI-1.4 Inadequate energy intake As related to energy needs.  As evidenced by continued usual intake of <1000 kcal.  Handouts given during visit include: After-Visit Summary (AVS) Revised Goals Sheet  Monitoring/Evaluation:  Dietary intake, exercise, and body weight in 5 week(s).

## 2021-03-07 NOTE — Patient Instructions (Addendum)
Goals:  Eat at least breakfast and dinner, with a fruit or veg at mid-day.  (The goal is to increase this mid-day eating to include a real meal eventually.)   2. Do at least 15 min of cardio and 15 min of resistance exercise 3 X wk.    Document progress on above goals using your Goals Sheet provided today.    Follow-up in-office appt on Oct 31 at 4 PM.

## 2021-03-08 DIAGNOSIS — M542 Cervicalgia: Secondary | ICD-10-CM | POA: Diagnosis not present

## 2021-03-21 DIAGNOSIS — M542 Cervicalgia: Secondary | ICD-10-CM | POA: Diagnosis not present

## 2021-03-26 DIAGNOSIS — M542 Cervicalgia: Secondary | ICD-10-CM | POA: Diagnosis not present

## 2021-04-08 ENCOUNTER — Other Ambulatory Visit: Payer: Self-pay | Admitting: Psychiatry

## 2021-04-08 ENCOUNTER — Ambulatory Visit: Payer: Medicare Other | Admitting: Family Medicine

## 2021-04-08 DIAGNOSIS — F3181 Bipolar II disorder: Secondary | ICD-10-CM

## 2021-04-08 NOTE — Telephone Encounter (Signed)
Pt LVM asking for a call back from the nurse.  She wants to return to taking 200mg  of Lamictal at morning and 200mg  in the evening.  She has experienced a lot of tragedy the last couple months and see feels the extra dose will help her.

## 2021-04-08 NOTE — Telephone Encounter (Signed)
She can increase the lamotrigine but she then needs to increase it gradually.  Otherwise it can trigger a rash that is potentially severe.  If she has been taking lamotrigine 100 mg twice daily she can increase to 1-1/2 lamotrigine in the morning and 1 in the evening for 2 weeks then she can increase lamotrigine to 200 mg twice daily.  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

## 2021-04-09 ENCOUNTER — Telehealth: Payer: Self-pay | Admitting: Psychiatry

## 2021-04-09 NOTE — Telephone Encounter (Signed)
LVM with info and to rtc with any questions  

## 2021-04-10 NOTE — Telephone Encounter (Signed)
Error.Marland KitchenMarland KitchenTraci handled

## 2021-04-15 ENCOUNTER — Telehealth: Payer: Self-pay | Admitting: Psychiatry

## 2021-04-15 ENCOUNTER — Other Ambulatory Visit: Payer: Self-pay | Admitting: Psychiatry

## 2021-04-15 NOTE — Telephone Encounter (Signed)
Patient said her brother passed away about 1-1/2 weeks ago. He was found at home. She stated she knew he was sick, but didn't know he would pass so soon. She said she lost her mother last year and this is bringing up emotions from then also.  She states she feels this is more than the normal grieving process. She says she is having difficulty sleeping but is only on Klonopin TID and isn't sure you will give her anything else. She is thinking that she may just need something short-term. Expressed my condolences.

## 2021-04-15 NOTE — Telephone Encounter (Signed)
Kelli Morales called on Friday at 4:30 and left a message. Her brother recently passed away and she is having difficulty with anxiety. She is also very shaky. Please call.

## 2021-04-15 NOTE — Telephone Encounter (Signed)
I am sorry for her loss as well.  Of course grieving can involve a lot of emotional reaction including insomnia.  Over time the symptoms will improve.  She has a prescription for trazodone 100 mg tablets 1 at night.  If she using it?  If so it is not working she can increase the dose to 1-1/2 or 2 tablets as needed.  That is a very safe medicine.

## 2021-04-15 NOTE — Telephone Encounter (Signed)
Pt needs appt

## 2021-04-15 NOTE — Telephone Encounter (Signed)
Patient informed and was oky with that protocol.

## 2021-04-20 ENCOUNTER — Other Ambulatory Visit: Payer: Self-pay | Admitting: Psychiatry

## 2021-04-20 DIAGNOSIS — F411 Generalized anxiety disorder: Secondary | ICD-10-CM

## 2021-04-21 NOTE — Telephone Encounter (Signed)
Call to RS

## 2021-04-25 ENCOUNTER — Ambulatory Visit (INDEPENDENT_AMBULATORY_CARE_PROVIDER_SITE_OTHER): Payer: Medicare Other | Admitting: Family Medicine

## 2021-04-25 ENCOUNTER — Other Ambulatory Visit: Payer: Self-pay

## 2021-04-25 DIAGNOSIS — Z713 Dietary counseling and surveillance: Secondary | ICD-10-CM | POA: Diagnosis not present

## 2021-04-25 DIAGNOSIS — N183 Chronic kidney disease, stage 3 unspecified: Secondary | ICD-10-CM | POA: Diagnosis not present

## 2021-04-25 NOTE — Progress Notes (Signed)
Telehealth Encounter for Medical Nutrition Therapy (MNT)  Patient has completed 2nd booster of COVID-19 vaccine on 12/27/20. PCP Kelli Stains, MD - [place] [facility] I connected with Kelli Morales (MRN [id number]) on 04/25/2021 by phone (patient could not access video-enabled, telemedicine platform), verified that I was speaking with the correct person using two identifiers, and that the patient was in a private environment conducive to confidentiality.  The patient agreed to proceed.  Persons participating in visit were patient and provider (registered dietitian) Kelli Center, PhD, RD, LDN, CEDRD.  Provider was located at [place] during this telehealth encounter; patient was at home.  Appt start time: 1600 end time: 1700 (1 hour)  Reason for telehealth visit: Referred by Kelli Stains, MD for medical nutrition therapy related to CKD stage 3.  Also has h/o HTN, osteoporosis, obesity, and Fe-deficiency anemia.   Relevant history/background: Kelli Morales is frustrated that she has not been able to lose weight.  Efforts have included a trial of Wegovey, keto diet, personal trainer 4 X wk for 3 months.  Kelli Morales saw Dr. Dema Severin on 01/22/21 with a chief complaint of fatigue; feels tired during the day despite bedtime of 7:30 PM and gets up at 5:30 AM.  Although she is still supplementing vitamin D, her vit D on 11/22/19 was 90 ng/mL.  Labs as of 01/22/21: TC  159 TG    37 HDL     93 LDL(calc)    58 Fe    48 (L) Fe sat     18 (L) Transferrin  191 (L)  Assessment:  Kelli Morales' brother died unexpectedly in late Apr 05, 2023, which has been very difficult for her.  People have been bringing her food, and although her appetite remains poor, she has eaten a bit more, including vegetables, just b/c it is there.  She has been working 2-3 hrs/day, but has otherwise been mostly isolated.  She will be joining relatives for Thanksgiving ("they're forcing me to").  She has not felt  able to focus on food and exercise goals.    Usual eating pattern: 2-3 small meals and 0-1 snack per day.   Recent physical activity:  None recently.     24-hr recall:  (Up at 5:30 AM) B (6:30 AM)-  1/3 c steel-cut oats, 3/4 c berries, water, black coffee  Snk ( AM)-  --- L (12:30 PM)-  1/2 cheeseburger on bun, let, tom, water    Snk ( PM)-  --- D (8:15 PM)-  Restaurant: Caesar salad, grilled salmon, 2 small rolls  Snk ( PM)-  --- Typical day? No.  More food than usual for dinner yesterday.    Previous estimated kcal intakes: 03/07/21: 1000 02/07/21:  1000  Nutritional Diagnosis: Some progress noted on NI-1.4 Inadequate energy intake As related to energy needs.  As evidenced by continued usual intake of <1000 kcal.  Handouts given during visit include: After-Visit Summary (AVS) Revised Goals Sheet  Monitoring/Evaluation:  Dietary intake and exercise in 3 month(s).

## 2021-04-25 NOTE — Patient Instructions (Addendum)
Consider what your brother would want for you right now.  Can you experience grief while taking care of yourself at the same time?   What does taking care of yourself mean?  Stay social, be physically active, eat right.    Goals:  1. Eat 3 REAL meals per day.  A REAL meal includes a protein food, carb food, and vegetables and/or fruit.     On days you have a smaller breakfast (e.g., eggs and berries), make sure you have a bit more for lunch.   2. Walk outside at least 2 X wk and do 5 rounds of stairs daily.   3. Do at least 10 minutes of resistance exercise 2 X week.    Document progress on above goals using your Goals Sheet provided today.    Follow-up video appt on Thursday, February 9 at 3 PM.

## 2021-04-28 ENCOUNTER — Other Ambulatory Visit: Payer: Self-pay | Admitting: Psychiatry

## 2021-04-28 DIAGNOSIS — F3181 Bipolar II disorder: Secondary | ICD-10-CM

## 2021-04-29 NOTE — Telephone Encounter (Signed)
Call to RS

## 2021-04-30 MED ORDER — LAMOTRIGINE 100 MG PO TABS: ORAL_TABLET | ORAL | 0 refills | Status: DC

## 2021-04-30 NOTE — Addendum Note (Signed)
Addended by: Bonney Leitz T on: 04/30/2021 09:41 AM   Modules accepted: Orders

## 2021-04-30 NOTE — Telephone Encounter (Signed)
Pt is schedulked for 1/26

## 2021-05-01 ENCOUNTER — Encounter: Payer: Self-pay | Admitting: Nurse Practitioner

## 2021-05-01 ENCOUNTER — Other Ambulatory Visit: Payer: Self-pay

## 2021-05-01 ENCOUNTER — Ambulatory Visit: Payer: Medicare Other | Admitting: Nurse Practitioner

## 2021-05-01 VITALS — BP 122/84 | HR 67

## 2021-05-01 DIAGNOSIS — G4700 Insomnia, unspecified: Secondary | ICD-10-CM

## 2021-05-01 DIAGNOSIS — R5383 Other fatigue: Secondary | ICD-10-CM | POA: Diagnosis not present

## 2021-05-01 DIAGNOSIS — L853 Xerosis cutis: Secondary | ICD-10-CM

## 2021-05-01 DIAGNOSIS — F32A Depression, unspecified: Secondary | ICD-10-CM

## 2021-05-01 NOTE — Progress Notes (Signed)
   Acute Office Visit  Subjective:    Patient ID: Kelli Morales, female    DOB: 1951/03/08, 70 y.o.   MRN: [id number]   HPI 70 y.o. presents today to discuss HRT. She has had fatigue, weight gain, depression, and dry skin. She has seen her PCP for fatigue and has had normal workup. She does not sleep well. She sees psychiatry for depression but feels medications have never done much but she has been on them for years. She lost mother and brother last year and feels it has been worse because of that. Weight gain also thought to be related to antidepressants. She has had worsening dry skin over the last 5 years and uses moisturizers multiple times per day. She did some research online and found information on hormone replacement therapy and wanted to see if this was an option for her.    Review of Systems  Constitutional:  Positive for fatigue and unexpected weight change.  Skin:        Dry skin  Psychiatric/Behavioral:  Positive for dysphoric mood and sleep disturbance.       Objective:    Physical Exam Constitutional:      Appearance: Normal appearance.    BP 122/84   Pulse 67   SpO2 91%  Wt Readings from Last 3 Encounters:  03/07/21 160 lb 12.8 oz (72.9 kg)  02/07/21 159 lb 6.4 oz (72.3 kg)  10/01/20 165 lb (74.8 kg)        Assessment & Plan:   Problem List Items Addressed This Visit   None Visit Diagnoses     Fatigue, unspecified type    -  Primary   Depression, unspecified depression type       Dry skin       Insomnia, unspecified type          Plan: Long discussion regarding symptoms and how they can all play a role within each other. Because these symptoms have either been ongoing for many years (even premenopausal years) or they are within the last 5 years they are not likely related to menopause. We discussed HRT and the risks associated with starting at her age and that it is not recommended. She understands. Fatigue could be age-related and that she is still  trying to function as she has always done, as well a from poor sleep. We did discuss the option of seeing functional medicine and she will consider it.      Tamela Gammon DNP, 11:20 AM 05/01/2021

## 2021-05-15 ENCOUNTER — Other Ambulatory Visit: Payer: Self-pay

## 2021-05-15 MED ORDER — TRAZODONE HCL 100 MG PO TABS: 100.0000 mg | ORAL_TABLET | Freq: Every evening | ORAL | 0 refills | Status: DC | PRN

## 2021-05-22 DIAGNOSIS — Z20822 Contact with and (suspected) exposure to covid-19: Secondary | ICD-10-CM | POA: Diagnosis not present

## 2021-05-25 ENCOUNTER — Other Ambulatory Visit: Payer: Self-pay | Admitting: Psychiatry

## 2021-05-25 DIAGNOSIS — F3181 Bipolar II disorder: Secondary | ICD-10-CM

## 2021-05-29 ENCOUNTER — Other Ambulatory Visit: Payer: Self-pay | Admitting: Psychiatry

## 2021-05-30 DIAGNOSIS — G43719 Chronic migraine without aura, intractable, without status migrainosus: Secondary | ICD-10-CM | POA: Diagnosis not present

## 2021-05-30 DIAGNOSIS — G43019 Migraine without aura, intractable, without status migrainosus: Secondary | ICD-10-CM | POA: Diagnosis not present

## 2021-06-18 ENCOUNTER — Encounter: Payer: Self-pay | Admitting: Psychiatry

## 2021-06-18 ENCOUNTER — Ambulatory Visit: Payer: Medicare Other | Admitting: Psychiatry

## 2021-06-18 ENCOUNTER — Other Ambulatory Visit: Payer: Self-pay

## 2021-06-18 DIAGNOSIS — F3181 Bipolar II disorder: Secondary | ICD-10-CM | POA: Diagnosis not present

## 2021-06-18 DIAGNOSIS — F411 Generalized anxiety disorder: Secondary | ICD-10-CM | POA: Diagnosis not present

## 2021-06-18 DIAGNOSIS — F4321 Adjustment disorder with depressed mood: Secondary | ICD-10-CM | POA: Diagnosis not present

## 2021-06-18 DIAGNOSIS — F5105 Insomnia due to other mental disorder: Secondary | ICD-10-CM | POA: Diagnosis not present

## 2021-06-18 DIAGNOSIS — G43009 Migraine without aura, not intractable, without status migrainosus: Secondary | ICD-10-CM

## 2021-06-18 NOTE — Progress Notes (Signed)
Kelli Morales [id number] 09-Jan-1951 71 y.o.   Subjective:   Patient ID:  Kelli Morales is a 71 y.o. (DOB 01/29/1951) female.  Chief Complaint:  Chief Complaint  Patient presents with   Follow-up    Mixed bipolar II disorder with rapid cycling ([place])   Depression   Anxiety   Sleeping Problem    Depression       Kelli Morales presents to the office today for follow-up of bipolar disorder.  Recent hospitalization July 2021 for alleged intentional overdose of medications.  Discharged 01/02/2020 on clonazepam 0.5 mg 3 times daily as needed, lamotrigine 400 mg twice daily, topiramate 200 mg nightly, trazodone 300 mg nightly, and ziprasidone 40 mg twice daily, and zonisamide 300 mg daily.  12/01/2019 appointment notable for patient having seen significant mood stability and reduction of manic symptoms and depression with an increase in lamotrigine and the addition of Geodon 20 mg twice daily.  No meds were changed.  01/18/20 appt with the following noted: Disc recent hospitalization.  Said she felt weak the day of hospitalization and grief and got distraught.  She denied over taking meds and got pulled over by police for driving erratically.   She denies intentional overdose but she was dehydrated. Upset over being at behavioral health hosp of 2 days. Lamotrigine reduced to 1 daily. Reduced Zonesimide gradually per MD. Reduced topiramate to 100 mg daily. Says she ate normally that day.   Says she's always been sensitive to Geodon but is doing OK with it right now.  No other explanation for what happened that day except she was distraught and overwhelmed with mother's estate. Started grief therapy at Hospice. Plan no med changes as recently hospitalized  02/10/2020 appt with the following noted: CO weight gain and thinks it is Geodon.  Gained weight gain on Seroquel and hasn't lost it.  Wants to wean off clonazepam.  Has cut so many meds back dramatically. BP has come back to  normal. I feel really good.  Calmer and more focused.  No brain fog.  No sadness.  Don't get as hyper.  Anxiety not out of control.  Better overall. No HA.  Meditation and therapy and it helped to wean off migraine meds.   Now more ready to let go of clonazepam.   Sleep variable.  Get wound up at work and can have a hard time sleeping but not too bad. Plan: Disc her desire to taper clonazepam.  Need to protect sleep.  Taking 1.5 mg HS. Reduce no faster than 0.25 mg reduction per month.   02/22/2020 phone call from patient complaining of 10 pound weight gain since January 04, 2020 when starting Geodon 40 mg twice daily. MD response:She has tried all of the mood stabilizers with the lowest weight gain risk.  She is not overweight.  But she is very focused on her weight.  The only alternative I see that she is not taking with low weight gain risk is haloperidol and typically ziprasidone or Geodon has even lower weight gain risk than haloperidol but there is individual variation. She had a psychiatric hospitalization on Geodon 20 mg twice daily so I would not be in favor of reducing the dose below Geodon 40 mg twice daily.  03/19/20 appt with following noted: Took herself off the Geodon bc jitters and weight gain. Restarted lamotrigine at 100 mg BID. Has gained from 128# to 155 # and she attributes to meds including quetiapine and perphenazine. Some anxiety.  Still  on clonazepam 1.5 mg daily.  Occ trazodone prn. Patient reports stable mood and denies depressed or irritable moods.   Patient denies difficulty with sleep initiation or maintenance. Denies appetite disturbance.  Patient reports that energy and motivation have been good.  Patient denies any difficulty with concentration.  Patient denies any suicidal ideation.  07/18/2020 appt with following noted: New job end of December.  Doing well with it. Agency directoer and DON. Ongoing problem with weight gain 14#/6 mos.  CO worse.  Doesn't know why.  On  [employer], walking.  Taking clonazepam 1.5 mg HS. "Probably not enough sleep", but sleeps 8-4.  Same sleep pattern for several years. No sig periods of hypomania except brief after mother's death. Patient reports stable mood and denies depressed or irritable moods.  Patient denies any recent difficulty with anxiety.  Patient denies difficulty with sleep initiation or maintenance. Denies appetite disturbance.  Patient reports that energy and motivation have been good.  Patient denies any difficulty with concentration.  Patient denies any suicidal ideation. Plan: Contingue clonazepam Taking 1.5 mg HS. It has some mood stabilizing effects and she has restarted the lamotrigine 100 mg twice daily.  She agrees to continue these 2 medications.  She agrees.  1/10/20023 appt noted: B and M passed away.  Called in 2023-05-19 crying and with insomnia. Rough time.  She had driven need to back track on knowing everything about what happened before brother died.  Did same thing after M died.  Couldn't let it go and obsessed on it. Tried Hospice without help. Poor sleep.  Initial with mind racing on deaths and what she needs to do. Taking klonopin 1.5 mg HS, trazodone 100 mg HS. Recognizes couldn't focus and memory issues after brother's death.  Past Psychiatric Medication Trials: Vraylar early weight gain.  Perphenazine 8 mg, quetiapine 300 mg, Abilify 10,  Geodon 40 BID,  Risperidone, latuda 5# weight gain Lithium, Depakote, CBZ remotely but this is uncertain, , CBZ 384 sleepiness. Lamotrigine 300  Mirtazapine,  paroxetine, Lexapro, sertraline, duloxetine 120,  Psychiatric hospitalizations summer 2021  Review of Systems:  Review of Systems  Constitutional:  Positive for unexpected weight change.  Cardiovascular:  Negative for palpitations.  Neurological:  Negative for dizziness, tremors, weakness and light-headedness.  Psychiatric/Behavioral:  Positive for depression, dysphoric mood and sleep disturbance. The  patient is nervous/anxious.    Medications: I have reviewed the patient's current medications.  Current Outpatient Medications  Medication Sig Dispense Refill   albuterol (VENTOLIN HFA) 108 (90 Base) MCG/ACT inhaler      amLODipine (NORVASC) 10 MG tablet Take 1 tablet (10 mg total) by mouth daily. 30 tablet 0   Ascorbic Acid (VITAMIN C PO) Take 1 tablet by mouth daily.     CALCIUM PO Take 1 tablet by mouth daily.     cholecalciferol (VITAMIN D) 1000 UNITS tablet Take 1,000 Units by mouth 2 (two) times daily.     clonazePAM (KLONOPIN) 0.5 MG tablet TAKE 1 TABLET (0.5 MG TOTAL) BY MOUTH 3 (THREE) TIMES DAILY AS NEEDED FOR ANXIETY (AND INSOMNIA). 270 tablet 0   Cyanocobalamin (VITAMIN B 12 PO) Take by mouth.     denosumab (PROLIA) 60 MG/ML SOLN injection Inject 60 mg into the skin every 6 (six) months. Administer in upper arm, thigh, or abdomen     fish oil-omega-3 fatty acids 1000 MG capsule Take 1 g by mouth daily.     fluticasone (FLONASE) 50 MCG/ACT nasal spray Place 2 sprays into the nose daily.  fluticasone furoate-vilanterol (BREO ELLIPTA) 100-25 MCG/INH AEPB Inhale 1 puff into the lungs daily.     Glycerin-Hypromellose-PEG 400 ([facility] DRY EYE RELIEF OP) Apply 1 drop to eye at bedtime as needed (dry eyes).     IRON PO Take 1 tablet by mouth daily.     lamoTRIgine (LAMICTAL) 100 MG tablet TAKE 2 TABLETS BY MOUTH TWICE  DAILY 120 tablet 0   Multiple Vitamins-Minerals (COMPLETE MULTIVITAMIN/MINERAL PO) Take 1 tablet by mouth daily.     pantoprazole (PROTONIX) 40 MG tablet Take 40 mg by mouth daily.     tiZANidine (ZANAFLEX) 2 MG tablet Take 2 mg by mouth every 8 (eight) hours as needed.     topiramate (TOPAMAX) 200 MG tablet Take 1 tablet (200 mg total) by mouth daily. (Patient taking differently: Take 50 mg by mouth daily.) 90 tablet 1   traZODone (DESYREL) 100 MG tablet Take 1 tablet (100 mg total) by mouth at bedtime as needed for sleep. 30 tablet 0   vitamin E 1000 UNIT capsule Take  1,000 Units by mouth daily.     vitamin k 100 MCG tablet Take 100 mcg by mouth daily.     magnesium gluconate (MAGONATE) 500 MG tablet Take 500 mg by mouth daily. (Patient not taking: Reported on 06/18/2021)     No current facility-administered medications for this visit.    Medication Side Effects: None  Allergies:  Allergies  Allergen Reactions   Dilaudid [Hydromorphone Hcl] Other (See Comments)    Nightmares   Divalproex Sodium     Became comatose when she took Depakote and Geodon together.   Nsaids     "kidney problem"   Perphenazine-Amitriptyline Itching   Seroquel [Quetiapine Fumerate]     Past Medical History:  Diagnosis Date   Anxiety    Asthma    Bipolar disorder (HCC)    Cataracts, bilateral    Depression    GERD (gastroesophageal reflux disease)    Hydatid cyst    Left   Hypertension    Kidney problem    RT kidney is smaller -> decreased output   Migraine    Osteoporosis 11/2016   T score -2.8  improved from prior DEXA   Right rotator cuff tear    Vertigo     Family History  Problem Relation Age of Onset   Diabetes Father    Hypertension Father    Heart disease Father    Stroke Father    Cancer Father        Lymphoma   Hypertension Mother    Heart disease Mother    Stroke Mother    Stroke Paternal Uncle    Heart disease Brother     Social History   Socioeconomic History   Marital status: Divorced    Spouse name: Not on file   Number of children: 1   Years of education: MSN   Highest education level: Not on file  Occupational History   Not on file  Tobacco Use   Smoking status: Never   Smokeless tobacco: Never  Vaping Use   Vaping Use: Never used  Substance and Sexual Activity   Alcohol use: Yes    Alcohol/week: 0.0 standard drinks    Comment: Rare   Drug use: No   Sexual activity: Yes    Birth control/protection: Post-menopausal    Comment: 1st intercourse 25 yo-2 partners  Other Topics Concern   Not on file  Social History  Narrative   Lives with husband and daughter  Caffeine use: 2 cups per day   Right handed   Social Determinants of Health   Financial Resource Strain: Not on file  Food Insecurity: Not on file  Transportation Needs: Not on file  Physical Activity: Not on file  Stress: Not on file  Social Connections: Not on file  Intimate Partner Violence: Not on file    Past Medical History, Surgical history, Social history, and Family history were reviewed and updated as appropriate.   Please see review of systems for further details on the patient's review from today.   Objective:   Physical Exam:  There were no vitals taken for this visit.  Physical Exam Constitutional:      General: She is not in acute distress. Musculoskeletal:        General: No deformity.  Neurological:     Mental Status: She is alert and oriented to person, place, and time.     Coordination: Coordination normal.  Psychiatric:        Attention and Perception: Attention and perception normal. She does not perceive auditory or visual hallucinations.        Mood and Affect: Mood is depressed. Mood is not anxious. Affect is not labile, blunt, angry or inappropriate.        Speech: Speech normal.        Behavior: Behavior normal.        Thought Content: Thought content normal. Thought content is not paranoid or delusional. Thought content does not include homicidal or suicidal ideation. Thought content does not include homicidal or suicidal plan.        Cognition and Memory: Cognition and memory normal.        Judgment: Judgment normal.     Comments: Insight intact Not suicidal.      Lab Review:     Component Value Date/Time   NA 137 01/02/2020 1122   K 3.8 01/02/2020 1122   CL 105 01/02/2020 1122   CO2 23 01/02/2020 1122   GLUCOSE 114 (H) 01/02/2020 1122   BUN 27 (H) 01/02/2020 1122   CREATININE 1.58 (H) 01/02/2020 1122   CALCIUM 9.3 01/02/2020 1122   PROT 8.4 (H) 01/02/2020 1122   ALBUMIN 5.0 01/02/2020  1122   AST 28 01/02/2020 1122   ALT 19 01/02/2020 1122   ALKPHOS 91 01/02/2020 1122   BILITOT 0.7 01/02/2020 1122   GFRNONAA 33 (L) 01/02/2020 1122   GFRAA 39 (L) 01/02/2020 1122       Component Value Date/Time   WBC 6.0 01/02/2020 1122   RBC 4.51 01/02/2020 1122   HGB 13.6 01/02/2020 1122   HCT 40.5 01/02/2020 1122   PLT 210 01/02/2020 1122   MCV 89.8 01/02/2020 1122   MCH 30.2 01/02/2020 1122   MCHC 33.6 01/02/2020 1122   RDW 11.9 01/02/2020 1122   LYMPHSABS 0.8 01/02/2020 1122   MONOABS 0.3 01/02/2020 1122   EOSABS 0.1 01/02/2020 1122   BASOSABS 0.0 01/02/2020 1122    No results found for: POCLITH, LITHIUM   No results found for: PHENYTOIN, PHENOBARB, VALPROATE, CBMZ   .res Assessment: Plan:    Ravin was seen today for follow-up, depression, anxiety and sleeping problem.  Diagnoses and all orders for this visit:  Mixed bipolar II disorder with rapid cycling ([place])  Generalized anxiety disorder  Insomnia due to mental condition  Complicated grief  Migraine without aura and without status migrainosus, not intractable    Patient with a long history of bipolar disorder with rapid cycling that has  been somewhat difficult to treat for a variety of reasons.  These include confounding migraine headaches which require a lot of medication and the fact that she is medication sensitive as well as having failed multiple medications.  In addition she refuses meds that can cause weight gain which greatly limit options.  2021 summer she was hospitalized which was originally attributed to an intentional overdose which the patient strongly denies.  There is no evidence that she intentionally overdosed or had any suicidal intent.  She is not currently suicidal and has not been her recent history.  She has been very overwhelmed with caring for her mother's estate by herself and stressed by grief.  She has started counseling which is helpful.  Supportive therapy dealing with grief  over B's death and just started new job.  Barely recovering with shakiness and feeling unsteady thinking about what happened and family implications of estate sale of family home.   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 Tired of the emotional pain.  She has recently reduced a number of the anticonvulsants that were used to help her migraine headaches.   She is not currently confused.  She is not a acutely depressed but more stressed.  She is not manic.  She has no psychosis.  There is no evidence of substance abuse. Off HA meds several months and doing OK.  She has had some recent anxiety and has decided to stay on the clonazepam and restart lamotrigine.  She is not having any manic or depressive symptoms at this time.  We discussed the short-term risks associated with benzodiazepines including sedation and increased fall risk among others.  Discussed long-term side effect risk including dependence, potential withdrawal symptoms, and the potential eventual dose-related risk of dementia.  But recent studies from 2020 dispute this association between benzodiazepines and dementia risk. Newer studies in 2020 do not support an association with dementia.  Disc this again with detail.  Disc try gradually reduce clonazepam.  Contingue clonazepam Taking 1.5 mg HS. It has some mood stabilizing effects and she has restarted the lamotrigine 100 mg twice daily.  She agrees to continue these 2 medications.  She agrees. Trial Caplyta 42 mg for bipolar depression and chronic insomnia.  Discussed potential metabolic side effects associated with atypical antipsychotics, as well as potential risk for movement side effects. Advised pt to contact office if movement side effects occur.   Disc poss Ozempic for weight loss.  Talk to her PCP  She's very concerned about weight.  This is a 30 min appt  FU 4-6 mos  Lynder Parents MD, DFAPA  Please see After Visit Summary for patient specific  instructions.  Future Appointments  Date Time Provider [place]  07/04/2021  9:30 AM Cottle, Billey Co., MD [facility] None  07/18/2021  3:00 PM Kennith Center, RD [facility] [place]    No orders of the defined types were placed in this encounter.   -------------------------------

## 2021-06-21 ENCOUNTER — Other Ambulatory Visit: Payer: Self-pay

## 2021-06-21 ENCOUNTER — Telehealth: Payer: Self-pay | Admitting: Psychiatry

## 2021-06-21 MED ORDER — CAPLYTA 42 MG PO CAPS: 42.0000 mg | ORAL_CAPSULE | Freq: Every day | ORAL | 0 refills | Status: DC

## 2021-06-21 NOTE — Telephone Encounter (Signed)
Rx sent 

## 2021-06-21 NOTE — Telephone Encounter (Signed)
Kelli Morales was given samples of Capylta 42 mg on her last visit and states it is working well for her. Can she please get a prescription called in to:  [id number] [address], [place]  Phone:  [phone]  Fax:  [phone]

## 2021-06-22 ENCOUNTER — Other Ambulatory Visit: Payer: Self-pay | Admitting: Psychiatry

## 2021-06-22 DIAGNOSIS — F3181 Bipolar II disorder: Secondary | ICD-10-CM

## 2021-06-24 NOTE — Telephone Encounter (Signed)
Pt informed samples are ok

## 2021-06-24 NOTE — Telephone Encounter (Signed)
Pt LVM requesting another sample of the  Caplyta as she waits for the PA to be approved.  Next appt 1/26

## 2021-07-04 ENCOUNTER — Ambulatory Visit: Payer: Medicare Other | Admitting: Psychiatry

## 2021-07-04 ENCOUNTER — Encounter: Payer: Self-pay | Admitting: Psychiatry

## 2021-07-04 ENCOUNTER — Other Ambulatory Visit: Payer: Self-pay

## 2021-07-04 DIAGNOSIS — F4321 Adjustment disorder with depressed mood: Secondary | ICD-10-CM | POA: Diagnosis not present

## 2021-07-04 DIAGNOSIS — G43009 Migraine without aura, not intractable, without status migrainosus: Secondary | ICD-10-CM

## 2021-07-04 DIAGNOSIS — F3181 Bipolar II disorder: Secondary | ICD-10-CM | POA: Diagnosis not present

## 2021-07-04 DIAGNOSIS — F5105 Insomnia due to other mental disorder: Secondary | ICD-10-CM | POA: Diagnosis not present

## 2021-07-04 DIAGNOSIS — F411 Generalized anxiety disorder: Secondary | ICD-10-CM

## 2021-07-04 MED ORDER — TRAZODONE HCL 100 MG PO TABS: 100.0000 mg | ORAL_TABLET | Freq: Every evening | ORAL | 0 refills | Status: DC | PRN

## 2021-07-04 MED ORDER — CLONAZEPAM 0.5 MG PO TABS: 0.5000 mg | ORAL_TABLET | Freq: Three times a day (TID) | ORAL | 0 refills | Status: DC | PRN

## 2021-07-04 MED ORDER — LAMOTRIGINE 100 MG PO TABS: 200.0000 mg | ORAL_TABLET | Freq: Two times a day (BID) | ORAL | 0 refills | Status: DC

## 2021-07-04 MED ORDER — CAPLYTA 42 MG PO CAPS: 42.0000 mg | ORAL_CAPSULE | Freq: Every day | ORAL | 0 refills | Status: DC

## 2021-07-04 NOTE — Progress Notes (Signed)
Kelli Morales [id number] [birth year] 71 y.o.   Subjective:   Patient ID:  Kelli Morales is a 71 y.o. (DOB 1950-10-03) female.  Chief Complaint:  Chief Complaint  Patient presents with   Follow-up    Mixed bipolar II disorder with rapid cycling ([place])   Depression   Anxiety    Depression       Kelli Morales presents to the office today for follow-up of bipolar disorder.  Recent hospitalization July 2021 for alleged intentional overdose of medications.  Discharged 01/02/2020 on clonazepam 0.5 mg 3 times daily as needed, lamotrigine 400 mg twice daily, topiramate 200 mg nightly, trazodone 300 mg nightly, and ziprasidone 40 mg twice daily, and zonisamide 300 mg daily.  12/01/2019 appointment notable for patient having seen significant mood stability and reduction of manic symptoms and depression with an increase in lamotrigine and the addition of Geodon 20 mg twice daily.  No meds were changed.  01/18/20 appt with the following noted: Disc recent hospitalization.  Said she felt weak the day of hospitalization and grief and got distraught.  She denied over taking meds and got pulled over by police for driving erratically.   She denies intentional overdose but she was dehydrated. Upset over being at behavioral health hosp of 2 days. Lamotrigine reduced to 1 daily. Reduced Zonesimide gradually per MD. Reduced topiramate to 100 mg daily. Says she ate normally that day.   Says she's always been sensitive to Geodon but is doing OK with it right now.  No other explanation for what happened that day except she was distraught and overwhelmed with mother's estate. Started grief therapy at Hospice. Plan no med changes as recently hospitalized  02/10/2020 appt with the following noted: CO weight gain and thinks it is Geodon.  Gained weight gain on Seroquel and hasn't lost it.  Wants to wean off clonazepam.  Has cut so many meds back dramatically. BP has come back to normal. I feel really good.   Calmer and more focused.  No brain fog.  No sadness.  Don't get as hyper.  Anxiety not out of control.  Better overall. No HA.  Meditation and therapy and it helped to wean off migraine meds.   Now more ready to let go of clonazepam.   Sleep variable.  Get wound up at work and can have a hard time sleeping but not too bad. Plan: Disc her desire to taper clonazepam.  Need to protect sleep.  Taking 1.5 mg HS. Reduce no faster than 0.25 mg reduction per month.   02/22/2020 phone call from patient complaining of 10 pound weight gain since January 04, 2020 when starting Geodon 40 mg twice daily. MD response:She has tried all of the mood stabilizers with the lowest weight gain risk.  She is not overweight.  But she is very focused on her weight.  The only alternative I see that she is not taking with low weight gain risk is haloperidol and typically ziprasidone or Geodon has even lower weight gain risk than haloperidol but there is individual variation. She had a psychiatric hospitalization on Geodon 20 mg twice daily so I would not be in favor of reducing the dose below Geodon 40 mg twice daily.  03/19/20 appt with following noted: Took herself off the Geodon bc jitters and weight gain. Restarted lamotrigine at 100 mg BID. Has gained from 128# to 155 # and she attributes to meds including quetiapine and perphenazine. Some anxiety.  Still on clonazepam 1.5 mg  daily.  Occ trazodone prn. Patient reports stable mood and denies depressed or irritable moods.   Patient denies difficulty with sleep initiation or maintenance. Denies appetite disturbance.  Patient reports that energy and motivation have been good.  Patient denies any difficulty with concentration.  Patient denies any suicidal ideation.  07/18/2020 appt with following noted: New job end of December.  Doing well with it. Agency directoer and DON. Ongoing problem with weight gain 14#/6 mos.  CO worse.  Doesn't know why.  On [employer], walking.  Taking  clonazepam 1.5 mg HS. "Probably not enough sleep", but sleeps 8-4.  Same sleep pattern for several years. No sig periods of hypomania except brief after mother's death. Patient reports stable mood and denies depressed or irritable moods.  Patient denies any recent difficulty with anxiety.  Patient denies difficulty with sleep initiation or maintenance. Denies appetite disturbance.  Patient reports that energy and motivation have been good.  Patient denies any difficulty with concentration.  Patient denies any suicidal ideation. Plan: Contingue clonazepam Taking 1.5 mg HS. It has some mood stabilizing effects and she has restarted the lamotrigine 100 mg twice daily.  She agrees to continue these 2 medications.  She agrees.  1/10/20023 appt noted: B and M passed away.  Called in 05-02-2023 crying and with insomnia. Rough time.  She had driven need to back track on knowing everything about what happened before brother died.  Did same thing after M died.  Couldn't let it go and obsessed on it. Tried Hospice without help. Poor sleep.  Initial with mind racing on deaths and what she needs to do. Taking klonopin 1.5 mg HS, trazodone 100 mg HS. Recognizes couldn't focus and memory issues after brother's death. Plan: Contingue clonazepam Taking 1.5 mg HS. It has some mood stabilizing effects and she has restarted the lamotrigine 100 mg twice daily.  She agrees to continue these 2 medications.  She agrees. Trial Caplyta 42 mg for bipolar depression and chronic insomnia.  06/21/2021 TC saying Caplyta was working.  07/04/2021 appt noted: The way my life is will be hard to not have some depression.  Not sadness.  Caplyta seems to work for sx so far and seems less down.  On it for 2 weeks.  Can't tell about weight gain yet. Exercising and diet healthier. Sleep is better with Caplyta.  Thinks it is helping so far. Not sure the cost yet.   Uncle died and going to MD.   Sx of loneliness but has friends and family.   Feels lonely even in big group.  Still dealing with grief with M and B.    Past Psychiatric Medication Trials: Vraylar early weight gain.  Perphenazine 8 mg, quetiapine 300 mg, Abilify 10,  Geodon 40 BID,  Risperidone, latuda 5# weight gain Lithium, Depakote, CBZ remotely but this is uncertain, , CBZ 628 sleepiness. Lamotrigine 300  Mirtazapine wt gain  paroxetine, Lexapro, sertraline, duloxetine 120,  Psychiatric hospitalizations summer 2021  Review of Systems:  Review of Systems  Constitutional:  Positive for unexpected weight change.  Cardiovascular:  Negative for palpitations.  Neurological:  Negative for dizziness, tremors, weakness and light-headedness.  Psychiatric/Behavioral:  Positive for dysphoric mood and sleep disturbance. The patient is nervous/anxious.    Medications: I have reviewed the patient's current medications.  Current Outpatient Medications  Medication Sig Dispense Refill   albuterol (VENTOLIN HFA) 108 (90 Base) MCG/ACT inhaler      amLODipine (NORVASC) 10 MG tablet Take 1 tablet (10 mg total)  by mouth daily. 30 tablet 0   Ascorbic Acid (VITAMIN C PO) Take 1 tablet by mouth daily.     CALCIUM PO Take 1 tablet by mouth daily.     cholecalciferol (VITAMIN D) 1000 UNITS tablet Take 1,000 Units by mouth 2 (two) times daily.     Cyanocobalamin (VITAMIN B 12 PO) Take by mouth.     denosumab (PROLIA) 60 MG/ML SOLN injection Inject 60 mg into the skin every 6 (six) months. Administer in upper arm, thigh, or abdomen     fish oil-omega-3 fatty acids 1000 MG capsule Take 1 g by mouth daily.     fluticasone (FLONASE) 50 MCG/ACT nasal spray Place 2 sprays into the nose daily.     fluticasone furoate-vilanterol (BREO ELLIPTA) 100-25 MCG/INH AEPB Inhale 1 puff into the lungs daily.     Glycerin-Hypromellose-PEG 400 ([facility] DRY EYE RELIEF OP) Apply 1 drop to eye at bedtime as needed (dry eyes).     IRON PO Take 1 tablet by mouth daily.     magnesium gluconate (MAGONATE) 500 MG  tablet Take 500 mg by mouth daily.     Multiple Vitamins-Minerals (COMPLETE MULTIVITAMIN/MINERAL PO) Take 1 tablet by mouth daily.     pantoprazole (PROTONIX) 40 MG tablet Take 40 mg by mouth daily.     tiZANidine (ZANAFLEX) 2 MG tablet Take 2 mg by mouth every 8 (eight) hours as needed.     topiramate (TOPAMAX) 200 MG tablet Take 1 tablet (200 mg total) by mouth daily. (Patient taking differently: Take 50 mg by mouth daily.) 90 tablet 1   vitamin E 1000 UNIT capsule Take 1,000 Units by mouth daily.     vitamin k 100 MCG tablet Take 100 mcg by mouth daily.     clonazePAM (KLONOPIN) 0.5 MG tablet Take 1 tablet (0.5 mg total) by mouth 3 (three) times daily as needed for anxiety (and insomnia). 270 tablet 0   lamoTRIgine (LAMICTAL) 100 MG tablet Take 2 tablets (200 mg total) by mouth 2 (two) times daily. 180 tablet 0   lumateperone tosylate (CAPLYTA) 42 MG capsule Take 1 capsule (42 mg total) by mouth daily. 90 capsule 0   traZODone (DESYREL) 100 MG tablet Take 1 tablet (100 mg total) by mouth at bedtime as needed for sleep. 90 tablet 0   No current facility-administered medications for this visit.    Medication Side Effects: None  Allergies:  Allergies  Allergen Reactions   Dilaudid [Hydromorphone Hcl] Other (See Comments)    Nightmares   Divalproex Sodium     Became comatose when she took Depakote and Geodon together.   Nsaids     "kidney problem"   Perphenazine-Amitriptyline Itching   Seroquel [Quetiapine Fumerate]     Past Medical History:  Diagnosis Date   Anxiety    Asthma    Bipolar disorder (HCC)    Cataracts, bilateral    Depression    GERD (gastroesophageal reflux disease)    Hydatid cyst    Left   Hypertension    Kidney problem    RT kidney is smaller -> decreased output   Migraine    Osteoporosis 11/2016   T score -2.8  improved from prior DEXA   Right rotator cuff tear    Vertigo     Family History  Problem Relation Age of Onset   Diabetes Father     Hypertension Father    Heart disease Father    Stroke Father    Cancer Father  Lymphoma   Hypertension Mother    Heart disease Mother    Stroke Mother    Stroke Paternal Uncle    Heart disease Brother     Social History   Socioeconomic History   Marital status: Divorced    Spouse name: Not on file   Number of children: 1   Years of education: MSN   Highest education level: Not on file  Occupational History   Not on file  Tobacco Use   Smoking status: Never   Smokeless tobacco: Never  Vaping Use   Vaping Use: Never used  Substance and Sexual Activity   Alcohol use: Yes    Alcohol/week: 0.0 standard drinks    Comment: Rare   Drug use: No   Sexual activity: Yes    Birth control/protection: Post-menopausal    Comment: 1st intercourse 34 yo-2 partners  Other Topics Concern   Not on file  Social History Narrative   Lives with husband and daughter   Caffeine use: 2 cups per day   Right handed   Social Determinants of Health   Financial Resource Strain: Not on file  Food Insecurity: Not on file  Transportation Needs: Not on file  Physical Activity: Not on file  Stress: Not on file  Social Connections: Not on file  Intimate Partner Violence: Not on file    Past Medical History, Surgical history, Social history, and Family history were reviewed and updated as appropriate.   Please see review of systems for further details on the patient's review from today.   Objective:   Physical Exam:  There were no vitals taken for this visit.  Physical Exam Constitutional:      General: She is not in acute distress. Musculoskeletal:        General: No deformity.  Neurological:     Mental Status: She is alert and oriented to person, place, and time.     Coordination: Coordination normal.  Psychiatric:        Attention and Perception: Attention and perception normal. She does not perceive auditory or visual hallucinations.        Mood and Affect: Mood is not  anxious or depressed. Affect is not labile, blunt, angry or inappropriate.        Speech: Speech normal.        Behavior: Behavior normal.        Thought Content: Thought content normal. Thought content is not paranoid or delusional. Thought content does not include homicidal or suicidal ideation. Thought content does not include suicidal plan.        Cognition and Memory: Cognition and memory normal.        Judgment: Judgment normal.     Comments: Insight intact Not suicidal.   Neat and pleasant lonely    Lab Review:     Component Value Date/Time   NA 137 01/02/2020 1122   K 3.8 01/02/2020 1122   CL 105 01/02/2020 1122   CO2 23 01/02/2020 1122   GLUCOSE 114 (H) 01/02/2020 1122   BUN 27 (H) 01/02/2020 1122   CREATININE 1.58 (H) 01/02/2020 1122   CALCIUM 9.3 01/02/2020 1122   PROT 8.4 (H) 01/02/2020 1122   ALBUMIN 5.0 01/02/2020 1122   AST 28 01/02/2020 1122   ALT 19 01/02/2020 1122   ALKPHOS 91 01/02/2020 1122   BILITOT 0.7 01/02/2020 1122   GFRNONAA 33 (L) 01/02/2020 1122   GFRAA 39 (L) 01/02/2020 1122       Component Value Date/Time  WBC 6.0 01/02/2020 1122   RBC 4.51 01/02/2020 1122   HGB 13.6 01/02/2020 1122   HCT 40.5 01/02/2020 1122   PLT 210 01/02/2020 1122   MCV 89.8 01/02/2020 1122   MCH 30.2 01/02/2020 1122   MCHC 33.6 01/02/2020 1122   RDW 11.9 01/02/2020 1122   LYMPHSABS 0.8 01/02/2020 1122   MONOABS 0.3 01/02/2020 1122   EOSABS 0.1 01/02/2020 1122   BASOSABS 0.0 01/02/2020 1122    No results found for: POCLITH, LITHIUM   No results found for: PHENYTOIN, PHENOBARB, VALPROATE, CBMZ   .res Assessment: Plan:    Kelli Morales was seen today for follow-up, depression and anxiety.  Diagnoses and all orders for this visit:  Mixed bipolar II disorder with rapid cycling ([place]) -     lumateperone tosylate (CAPLYTA) 42 MG capsule; Take 1 capsule (42 mg total) by mouth daily. -     lamoTRIgine (LAMICTAL) 100 MG tablet; Take 2 tablets (200 mg total) by mouth 2  (two) times daily.  Generalized anxiety disorder -     lumateperone tosylate (CAPLYTA) 42 MG capsule; Take 1 capsule (42 mg total) by mouth daily. -     clonazePAM (KLONOPIN) 0.5 MG tablet; Take 1 tablet (0.5 mg total) by mouth 3 (three) times daily as needed for anxiety (and insomnia).  Insomnia due to mental condition -     traZODone (DESYREL) 100 MG tablet; Take 1 tablet (100 mg total) by mouth at bedtime as needed for sleep.  Complicated grief  Migraine without aura and without status migrainosus, not intractable    Greater than 50% of 30 min face to face time with patient was spent on counseling and coordination of care. We discussed Patient with a long history of bipolar disorder with rapid cycling that has been somewhat difficult to treat for a variety of reasons.  These include confounding migraine headaches which require a lot of medication and the fact that she is medication sensitive as well as having failed multiple medications.  In addition she refuses meds that can cause weight gain which greatly limit options.  2021 summer she was psych hospitalized which was originally attributed to an intentional overdose which the patient strongly denies.  There is no evidence that she intentionally overdosed or had any suicidal intent.  She is not currently suicidal and has not been her recent history.  She has been very overwhelmed with caring for her mother's estate by herself and stressed by grief.  She has started counseling which is helpful.  Supportive therapy dealing with grief over B's death and just started new job.  Barely recovering with shakiness and feeling unsteady thinking about what happened and family implications of estate sale of family home.   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  Encourage still work on the grief. Tired of the emotional pain.  She has recently reduced a number of the anticonvulsants that were used to help her migraine headaches.   She is  not currently confused.  She is not a acutely depressed but more stressed.  She is not manic.  She has no psychosis.  There is no evidence of substance abuse. Off HA meds several months and doing OK.  She has had some recent anxiety and has decided to stay on the clonazepam and restart lamotrigine.  She is not having any manic or depressive symptoms at this time.  We discussed the short-term risks associated with benzodiazepines including sedation and increased fall risk among others.  Discussed  long-term side effect risk including dependence, potential withdrawal symptoms, and the potential eventual dose-related risk of dementia.  But recent studies from 2020 dispute this association between benzodiazepines and dementia risk. Newer studies in 2020 do not support an association with dementia.  Disc this again with detail.  Disc try gradually reduce clonazepam.  Contingue clonazepam Taking 1.5 mg HS. It has some mood stabilizing effects and she has restarted the lamotrigine 100 mg twice daily.  She agrees to continue these 2 medications.  She agrees. Continue Caplyta 42 mg for bipolar depression and chronic insomnia. Trazodone prn  Discussed potential metabolic side effects associated with atypical antipsychotics, as well as potential risk for movement side effects. Advised pt to contact office if movement side effects occur.   Disc poss Ozempic for weight loss.  Talk to her PCP  She's very concerned about weight.  FU 2 mos  Lynder Parents MD, DFAPA  Please see After Visit Summary for patient specific instructions.  Future Appointments  Date Time Provider [place]  07/18/2021  3:00 PM Kennith Center, RD [facility] [place]    No orders of the defined types were placed in this encounter.   -------------------------------

## 2021-07-15 ENCOUNTER — Telehealth: Payer: Self-pay | Admitting: Psychiatry

## 2021-07-15 DIAGNOSIS — N183 Chronic kidney disease, stage 3 unspecified: Secondary | ICD-10-CM | POA: Diagnosis not present

## 2021-07-15 DIAGNOSIS — Z0000 Encounter for general adult medical examination without abnormal findings: Secondary | ICD-10-CM | POA: Diagnosis not present

## 2021-07-15 DIAGNOSIS — I129 Hypertensive chronic kidney disease with stage 1 through stage 4 chronic kidney disease, or unspecified chronic kidney disease: Secondary | ICD-10-CM | POA: Diagnosis not present

## 2021-07-15 DIAGNOSIS — J4530 Mild persistent asthma, uncomplicated: Secondary | ICD-10-CM | POA: Diagnosis not present

## 2021-07-15 DIAGNOSIS — J301 Allergic rhinitis due to pollen: Secondary | ICD-10-CM | POA: Diagnosis not present

## 2021-07-15 DIAGNOSIS — M810 Age-related osteoporosis without current pathological fracture: Secondary | ICD-10-CM | POA: Diagnosis not present

## 2021-07-15 DIAGNOSIS — K219 Gastro-esophageal reflux disease without esophagitis: Secondary | ICD-10-CM | POA: Diagnosis not present

## 2021-07-15 DIAGNOSIS — Z23 Encounter for immunization: Secondary | ICD-10-CM | POA: Diagnosis not present

## 2021-07-15 DIAGNOSIS — D509 Iron deficiency anemia, unspecified: Secondary | ICD-10-CM | POA: Diagnosis not present

## 2021-07-15 NOTE — Telephone Encounter (Signed)
I found in [place], had not been sent to plan. I filled in the demographic info and sent to plan.  Letting you take it from there.  I haven't documented anything in Epic.

## 2021-07-15 NOTE — Telephone Encounter (Signed)
Thank you Shelton Silvas, her PA has been submitted and pending

## 2021-07-15 NOTE — Telephone Encounter (Signed)
Pt LVM advising Dr. Clovis Pu started her on [place], which needs an authorization.  [facility] said they need a doctor's authorization.  Insurance was going to send authorization.  Pls call patient back with next steps.  No upcoming appt scheduled.

## 2021-07-15 NOTE — Telephone Encounter (Signed)
Prior approval received for CAPLYTA 42 MG effective through 06/08/2022. PA # [id number] [facility]

## 2021-07-16 ENCOUNTER — Other Ambulatory Visit: Payer: Self-pay | Admitting: Psychiatry

## 2021-07-16 DIAGNOSIS — F3181 Bipolar II disorder: Secondary | ICD-10-CM

## 2021-07-16 NOTE — Telephone Encounter (Signed)
Patient notified. [facility] has a script on file.

## 2021-07-18 ENCOUNTER — Encounter: Payer: Medicare Other | Admitting: Family Medicine

## 2021-07-18 ENCOUNTER — Other Ambulatory Visit: Payer: Self-pay

## 2021-07-18 NOTE — Progress Notes (Signed)
This encounter was created in error - please disregard.

## 2021-07-26 ENCOUNTER — Telehealth: Payer: Self-pay | Admitting: Psychiatry

## 2021-07-26 NOTE — Telephone Encounter (Signed)
Tell her to use samples for another 10 days or so and then call us and let us know whether or not it has been helpful.  If so we can try to get it through patient assistance.  I do not have alternatives that are as likely to work with low side effect risk as Caplyta

## 2021-07-26 NOTE — Telephone Encounter (Signed)
Please review

## 2021-07-26 NOTE — Telephone Encounter (Signed)
Pt called and said that with insurance the caplyta costs her $500 dollars a month. Pt would like to have a different medicine that doesn't cost as much. Please call her at [phone]

## 2021-07-26 NOTE — Telephone Encounter (Signed)
Pt informed samples pulled

## 2021-08-05 ENCOUNTER — Telehealth: Payer: Self-pay

## 2021-08-14 ENCOUNTER — Telehealth: Payer: Self-pay | Admitting: Psychiatry

## 2021-08-14 NOTE — Telephone Encounter (Signed)
FYI

## 2021-08-14 NOTE — Telephone Encounter (Signed)
Kelli Morales called this morning at 9:15am to report that she is no longer ging to take the [place].  She it is too expensive.  Also she has been on a very strict diet and exercise regimen for the last 2 weeks, working with a trainer, and she has only lost [place].  Her research indicated that the Caplyta may be the cause of her not being able to lose weight.  Research also indicated that Metformin 250/mg 3/day would possibly be an alternative medication.  But mostly she wanted you to know that she no longer take the [place].  On a side note, she would not be eligible for Pt. Assistance because she has insurance and they automatically disqualify people with insurance no matter the cost.  ?

## 2021-08-15 NOTE — Telephone Encounter (Signed)
Prior Authorization ?Caplyta 42 mg capsules  ?Approved 07/15/21 - 06/08/22  ?[facility] [id number]  ?

## 2021-08-21 ENCOUNTER — Telehealth: Payer: Self-pay | Admitting: Psychiatry

## 2021-08-21 ENCOUNTER — Other Ambulatory Visit: Payer: Self-pay | Admitting: Psychiatry

## 2021-08-21 NOTE — Telephone Encounter (Signed)
Pt lvm at 1:50 pm stating that she has stopped the caplyta and she is doing fine. She has extra samples that she hasn't used and wants to know if she can bring the rest back. She also said that she is having a hard time sleeping and would like to increase her tramadol from one to two at night. Please give her a call at [phone] ?

## 2021-08-21 NOTE — Telephone Encounter (Signed)
She can increase the trazodone from one of the 100 mg tablets to 2 of the 100 mg tablets at night ? ?Yes she could bring the Caplyta to the office and we can pass it along to somebody who can use it given that it is an expensive medicine and not always affordable ?

## 2021-08-21 NOTE — Telephone Encounter (Signed)
Pt informed

## 2021-08-21 NOTE — Telephone Encounter (Signed)
Pt stated she is having trouble falling asleep and staying asleep.She takes herbal tea to help her fall asleep but it does not help her all the time.She wants to increase trazodone

## 2021-08-28 ENCOUNTER — Telehealth: Payer: Self-pay | Admitting: Psychiatry

## 2021-08-28 DIAGNOSIS — F3181 Bipolar II disorder: Secondary | ICD-10-CM

## 2021-08-28 NOTE — Telephone Encounter (Signed)
Noted  

## 2021-08-28 NOTE — Telephone Encounter (Signed)
Verneal called to inform us that as of 08/07/21 she no longer uses her mail order pharmacy [facility]. It is changed now to [facility]. She said [facility] will be sending a request to [place] shortly. Per Mickel Baas, she would like for [place] to make note of this.  ?

## 2021-09-02 ENCOUNTER — Telehealth: Payer: Self-pay | Admitting: Psychiatry

## 2021-09-02 DIAGNOSIS — F3181 Bipolar II disorder: Secondary | ICD-10-CM

## 2021-09-02 MED ORDER — LAMOTRIGINE 100 MG PO TABS: 200.0000 mg | ORAL_TABLET | Freq: Two times a day (BID) | ORAL | 0 refills | Status: DC

## 2021-09-02 NOTE — Telephone Encounter (Signed)
Pt will need Lamictal '100mg'$  2 pills bid RF for 90 days - Please send to [facility] ( [place]) per note on 3/22. Says she called this am too, but no note is there. Send in a two -week supply also to [facility] [place] [place]. She will need it by Wed. Or will run out. ?

## 2021-09-02 NOTE — Telephone Encounter (Signed)
Pt called requesting Rx to [facility] for 90 day. [facility] Will be out Wednesday. Contact Pt @ [phone] if needed.  ?

## 2021-09-03 DIAGNOSIS — M810 Age-related osteoporosis without current pathological fracture: Secondary | ICD-10-CM | POA: Diagnosis not present

## 2021-09-03 MED ORDER — LAMOTRIGINE 100 MG PO TABS: 200.0000 mg | ORAL_TABLET | Freq: Two times a day (BID) | ORAL | 0 refills | Status: DC

## 2021-09-03 NOTE — Telephone Encounter (Signed)
Kelli Morales I see you sent a local supply, did you also send her 90 day to [facility]? ?

## 2021-09-03 NOTE — Telephone Encounter (Signed)
I sent

## 2021-09-04 ENCOUNTER — Other Ambulatory Visit: Payer: Self-pay | Admitting: Psychiatry

## 2021-09-04 DIAGNOSIS — F3181 Bipolar II disorder: Secondary | ICD-10-CM

## 2021-09-04 MED ORDER — LAMOTRIGINE 100 MG PO TABS: 200.0000 mg | ORAL_TABLET | Freq: Two times a day (BID) | ORAL | 0 refills | Status: DC

## 2021-09-04 NOTE — Telephone Encounter (Signed)
sent 

## 2021-09-11 ENCOUNTER — Telehealth: Payer: Self-pay | Admitting: Psychiatry

## 2021-09-11 DIAGNOSIS — F5105 Insomnia due to other mental disorder: Secondary | ICD-10-CM

## 2021-09-11 MED ORDER — TRAZODONE HCL 100 MG PO TABS: 200.0000 mg | ORAL_TABLET | Freq: Every evening | ORAL | 0 refills | Status: DC | PRN

## 2021-09-11 NOTE — Telephone Encounter (Signed)
Pt requesting Rx to [facility] for Trazodone 100 mg 2 at night.  ?

## 2021-09-11 NOTE — Telephone Encounter (Signed)
Sent!

## 2021-10-08 ENCOUNTER — Encounter: Payer: Self-pay | Admitting: Nurse Practitioner

## 2021-10-08 ENCOUNTER — Ambulatory Visit (INDEPENDENT_AMBULATORY_CARE_PROVIDER_SITE_OTHER): Payer: Medicare (Managed Care) | Admitting: Nurse Practitioner

## 2021-10-08 ENCOUNTER — Other Ambulatory Visit (HOSPITAL_COMMUNITY)
Admission: RE | Admit: 2021-10-08 | Discharge: 2021-10-08 | Disposition: A | Discharge status: Home / Self Care | Payer: Medicare (Managed Care) | Source: Ambulatory Visit | Attending: Nurse Practitioner | Admitting: Nurse Practitioner

## 2021-10-08 VITALS — BP 122/74 | Ht 65.0 in | Wt 150.0 lb

## 2021-10-08 DIAGNOSIS — Z01419 Encounter for gynecological examination (general) (routine) without abnormal findings: Secondary | ICD-10-CM

## 2021-10-08 DIAGNOSIS — Z780 Asymptomatic menopausal state: Secondary | ICD-10-CM

## 2021-10-08 DIAGNOSIS — M810 Age-related osteoporosis without current pathological fracture: Secondary | ICD-10-CM | POA: Diagnosis not present

## 2021-10-08 NOTE — Progress Notes (Signed)
? ?  [place] 1951/03/13 [id number] ? ? ?History:  71 y.o. G2P0011 presents for breast and pelvic exam. No GYN complaints. Postmenopausal - no HRT, no bleeding. Normal pap and mammogram history. On Prolia for osteoporosis, stable, managed by PCP. Bipolar disorder and anxiety managed by psych.  ? ?Gynecologic History ?No LMP recorded. Patient is postmenopausal. ?  ?Contraception: post menopausal status ?Sexually active: No ? ?Health Maintenance ?Last Pap: 04/29/2018. Results were: Normal ?Last mammogram: 11/2020 per patient. Results were: Normal ?Last colonoscopy: 2015. Results: Normal ?Last Dexa: 11/2020 per patient. Results were: stable per patient, on Prolia managed by PCP ? ?Past medical history, past surgical history, family history and social history were all reviewed and documented in the EPIC chart. [facility].  ? ?ROS:  A ROS was performed and pertinent positives and negatives are included. ? ?Exam: ? ?Vitals:  ? 10/08/21 1513  ?BP: 122/74  ?Weight: 150 lb (68 kg)  ?Height: '5\' 5"'$  (1.651 m)  ? ? ?Body mass index is 24.96 kg/m?. ? ?General appearance:  Normal ?Thyroid:  Symmetrical, normal in size, without palpable masses or nodularity. ?Respiratory ? Auscultation:  Clear without wheezing or rhonchi ?Cardiovascular ? Auscultation:  Regular rate, without rubs, murmurs or gallops ? Edema/varicosities:  Not grossly evident ?Abdominal ? Soft,nontender, without masses, guarding or rebound. ? Liver/spleen:  No organomegaly noted ? Hernia:  None appreciated ? Skin ? Inspection:  Grossly normal ?Breasts: Examined lying and sitting.  ? Right: Without masses, retractions, nipple discharge or axillary adenopathy. ? ? Left: Without masses, retractions, nipple discharge or axillary adenopathy. ?Gentitourinary  ? Inguinal/mons:  Normal without inguinal adenopathy ? External genitalia:  Normal appearing vulva with no masses, tenderness, or lesions ? BUS/Urethra/Skene's glands:  Normal ? Vagina:  Normal  appearing with normal color and discharge, no lesions. Atrophic changes ? Cervix:  Normal appearing without discharge or lesions ? Uterus:  Normal in size, shape and contour.  Midline and mobile, nontender ? Adnexa/parametria:   ?  Rt: Normal in size, without masses or tenderness. ?  Lt: Normal in size, without masses or tenderness. ? Anus and perineum: Normal ? Digital rectal exam: Normal sphincter tone without palpated masses or tenderness ? ?Patient informed chaperone available to be present for breast and pelvic exam. Patient has requested no chaperone to be present. Patient has been advised what will be completed during breast and pelvic exam.  ? ?Assessment/Plan:  71 y.o. G2P0011 for breast and pelvic exam.  ? ?Well female exam with routine gynecological exam - Education provided on SBEs, importance of preventative screenings, current guidelines, high calcium diet, regular exercise, and multivitamin daily. Labs with PCP. Congratulated on 20 pound weight loss.  ? ?Postmenopausal - no HRT, no bleeding.  ? ?Age-related osteoporosis without current pathological fracture - managed by PCP. On prolia. DXA already scheduled for this summer. She is very active in yoga, pilates, and weight lifting.  ? ?Screening for cervical cancer - Normal Pap history.  Discussed current guidelines and the option to stop screening. She had a friend pass from cervical cancer and would like to continue. Pap with reflex today.  ? ?Screening for breast cancer - Normal mammogram history. Continue annual screenings. Normal breast exam today. ? ?Screening for colon cancer - Normal colonoscopy in 2015. Will repeat at 10-year interval per GI recommendation.  ? ?Return in 2 years for breast and pelvic exam.  ? ? ? ? ?Tamela Gammon DNP, 3:38 PM 10/08/2021 ? ?

## 2021-10-09 LAB — CYTOLOGY - PAP: Diagnosis: NEGATIVE

## 2021-10-11 ENCOUNTER — Other Ambulatory Visit: Payer: Self-pay | Admitting: Psychiatry

## 2021-10-11 DIAGNOSIS — F411 Generalized anxiety disorder: Secondary | ICD-10-CM

## 2021-10-30 DIAGNOSIS — N1832 Chronic kidney disease, stage 3b: Secondary | ICD-10-CM | POA: Diagnosis not present

## 2021-11-23 ENCOUNTER — Other Ambulatory Visit: Payer: Self-pay | Admitting: Psychiatry

## 2021-11-23 DIAGNOSIS — F3181 Bipolar II disorder: Secondary | ICD-10-CM

## 2021-11-26 DIAGNOSIS — G43719 Chronic migraine without aura, intractable, without status migrainosus: Secondary | ICD-10-CM | POA: Diagnosis not present

## 2021-11-26 NOTE — Telephone Encounter (Signed)
Has an appt with Dr. Clovis Pu tomorrow

## 2021-11-27 ENCOUNTER — Ambulatory Visit (INDEPENDENT_AMBULATORY_CARE_PROVIDER_SITE_OTHER): Payer: Medicare (Managed Care) | Admitting: Psychiatry

## 2021-11-27 ENCOUNTER — Encounter: Payer: Self-pay | Admitting: Psychiatry

## 2021-11-27 DIAGNOSIS — F411 Generalized anxiety disorder: Secondary | ICD-10-CM | POA: Diagnosis not present

## 2021-11-27 DIAGNOSIS — F5105 Insomnia due to other mental disorder: Secondary | ICD-10-CM | POA: Diagnosis not present

## 2021-11-27 DIAGNOSIS — F3181 Bipolar II disorder: Secondary | ICD-10-CM

## 2021-11-27 MED ORDER — HYDROXYZINE HCL 10 MG PO TABS: 10.0000 mg | ORAL_TABLET | Freq: Every evening | ORAL | 0 refills | Status: DC | PRN

## 2021-11-27 NOTE — Progress Notes (Signed)
[name] [id number] 1951/01/28 71 y.o.   Subjective:   Patient ID:  Kelli Morales is a 71 y.o. (DOB [birth year]) female.  Chief Complaint:  Chief Complaint  Patient presents with   Follow-up    Mixed bipolar II disorder with rapid cycling ([place])   Anxiety   Sleeping Problem    Depression        Associated symptoms include headaches.  Kelli Morales presents to the office today for follow-up of bipolar disorder.  hospitalization July 2021 for alleged intentional overdose of medications.  Discharged 01/02/2020 on clonazepam 0.5 mg 3 times daily as needed, lamotrigine 400 mg twice daily, topiramate 200 mg nightly, trazodone 300 mg nightly, and ziprasidone 40 mg twice daily, and zonisamide 300 mg daily.  12/01/2019 appointment notable for patient having seen significant mood stability and reduction of manic symptoms and depression with an increase in lamotrigine and the addition of Geodon 20 mg twice daily.  No meds were changed.  01/18/20 appt with the following noted: Disc recent hospitalization.  Said she felt weak the day of hospitalization and grief and got distraught.  She denied over taking meds and got pulled over by police for driving erratically.   She denies intentional overdose but she was dehydrated. Upset over being at behavioral health hosp of 2 days. Lamotrigine reduced to 1 daily. Reduced Zonesimide gradually per MD. Reduced topiramate to 100 mg daily. Says she ate normally that day.   Says she's always been sensitive to Geodon but is doing OK with it right now.  No other explanation for what happened that day except she was distraught and overwhelmed with mother's estate. Started grief therapy at Hospice. Plan no med changes as recently hospitalized  02/10/2020 appt with the following noted: CO weight gain and thinks it is Geodon.  Gained weight gain on Seroquel and hasn't lost it.  Wants to wean off clonazepam.  Has cut so many meds back dramatically. BP has  come back to normal. I feel really good.  Calmer and more focused.  No brain fog.  No sadness.  Don't get as hyper.  Anxiety not out of control.  Better overall. No HA.  Meditation and therapy and it helped to wean off migraine meds.   Now more ready to let go of clonazepam.   Sleep variable.  Get wound up at work and can have a hard time sleeping but not too bad. Plan: Disc her desire to taper clonazepam.  Need to protect sleep.  Taking 1.5 mg HS. Reduce no faster than 0.25 mg reduction per month.   02/22/2020 phone call from patient complaining of 10 pound weight gain since January 04, 2020 when starting Geodon 40 mg twice daily. MD response:She has tried all of the mood stabilizers with the lowest weight gain risk.  She is not overweight.  But she is very focused on her weight.  The only alternative I see that she is not taking with low weight gain risk is haloperidol and typically ziprasidone or Geodon has even lower weight gain risk than haloperidol but there is individual variation. She had a psychiatric hospitalization on Geodon 20 mg twice daily so I would not be in favor of reducing the dose below Geodon 40 mg twice daily.  03/19/20 appt with following noted: Took herself off the Geodon bc jitters and weight gain. Restarted lamotrigine at 100 mg BID. Has gained from 128# to 155 # and she attributes to meds including quetiapine and perphenazine. Some anxiety.  Still on clonazepam 1.5 mg daily.  Occ trazodone prn. Patient reports stable mood and denies depressed or irritable moods.   Patient denies difficulty with sleep initiation or maintenance. Denies appetite disturbance.  Patient reports that energy and motivation have been good.  Patient denies any difficulty with concentration.  Patient denies any suicidal ideation.  07/18/2020 appt with following noted: New job end of December.  Doing well with it. Agency directoer and DON. Ongoing problem with weight gain 14#/6 mos.  CO worse.  Doesn't  know why.  On [employer], walking.  Taking clonazepam 1.5 mg HS. "Probably not enough sleep", but sleeps 8-4.  Same sleep pattern for several years. No sig periods of hypomania except brief after mother's death. Patient reports stable mood and denies depressed or irritable moods.  Patient denies any recent difficulty with anxiety.  Patient denies difficulty with sleep initiation or maintenance. Denies appetite disturbance.  Patient reports that energy and motivation have been good.  Patient denies any difficulty with concentration.  Patient denies any suicidal ideation. Plan: Contingue clonazepam Taking 1.5 mg HS. It has some mood stabilizing effects and she has restarted the lamotrigine 100 mg twice daily.  She agrees to continue these 2 medications.  She agrees.  1/10/20023 appt noted: B and M passed away.  Called in May 06, 2023 crying and with insomnia. Rough time.  She had driven need to back track on knowing everything about what happened before brother died.  Did same thing after M died.  Couldn't let it go and obsessed on it. Tried Hospice without help. Poor sleep.  Initial with mind racing on deaths and what she needs to do. Taking klonopin 1.5 mg HS, trazodone 100 mg HS. Recognizes couldn't focus and memory issues after brother's death. Plan: Contingue clonazepam Taking 1.5 mg HS. It has some mood stabilizing effects and she has restarted the lamotrigine 100 mg twice daily.  She agrees to continue these 2 medications.  She agrees. Trial Caplyta 42 mg for bipolar depression and chronic insomnia.  06/21/2021 TC saying Caplyta was working.  07/04/2021 appt noted: The way my life is will be hard to not have some depression.  Not sadness.  Caplyta seems to work for sx so far and seems less down.  On it for 2 weeks.  Can't tell about weight gain yet. Exercising and diet healthier. Sleep is better with Caplyta.  Thinks it is helping so far. Not sure the cost yet.   Uncle died and going to [place].   Sx of  loneliness but has friends and family.  Feels lonely even in big group.  Still dealing with grief with M and B.   Plan: Contingue clonazepam Taking 1.5 mg HS. It has some mood stabilizing effects and she has restarted the lamotrigine 100 mg twice daily.  She agrees to continue these 2 medications.  She agrees. Continue Caplyta 42 mg for bipolar depression and chronic insomnia. Trazodone prn  08/14/2021 phone call saying she was no longer taking Caplyta because it was too expensive and also because she feared weight gain with it. 08/21/2021 phone call complaining of insomnia and wanting to increase trazodone.  Okay to increase trazodone to 200 mg nightly  11/27/2021 appointment with the following noted: Still dealing with HA.  Dr. Domingo Cocking, he increased to 200 mg daily. Lost 27# since 2023/05/06. B died in 04-05-23 and started her on spiral down.  Other B dx prostate CA.  Worried over him.  I was such a wreck.  Nephew died  of drugs at 38. Just back from  [place] a week ago. Trouble sleeping since October; initial and terminal.  To bed 8 and up 430.  Falling asleep 10 and awake at 3.   Down to clonazepam 0.5 mg 2 at night.  Anxiety better with exercise.  Has dumped a couple of BF and niece said she's needing therapy.  Not sure why. Better with grief now than she was.  Not depressed.  Minimal manic sx  Past Psychiatric Medication Trials: Vraylar early weight gain.  Perphenazine 8 mg, quetiapine 300 mg, Abilify 10,  Geodon 40 BID,  Risperidone, latuda 5# weight gain Caplyta cost problems Lithium, Depakote, CBZ remotely but this is uncertain, , CBZ 885 sleepiness. Lamotrigine 300  Mirtazapine wt gain  Trazodone 200 NR paroxetine, Lexapro, sertraline, duloxetine 120,  Psychiatric hospitalizations summer 2021  Review of Systems:  Review of Systems  Constitutional:  Positive for unexpected weight change.  Cardiovascular:  Negative for palpitations.  Neurological:  Positive for headaches. Negative for  dizziness, tremors, weakness and light-headedness.  Psychiatric/Behavioral:  Positive for dysphoric mood and sleep disturbance. The patient is nervous/anxious.     Medications: I have reviewed the patient's current medications.  Current Outpatient Medications  Medication Sig Dispense Refill   albuterol (VENTOLIN HFA) 108 (90 Base) MCG/ACT inhaler      amLODipine (NORVASC) 10 MG tablet Take 1 tablet (10 mg total) by mouth daily. 30 tablet 0   Ascorbic Acid (VITAMIN C PO) Take 1 tablet by mouth daily.     CALCIUM PO Take 1 tablet by mouth daily.     cholecalciferol (VITAMIN D) 1000 UNITS tablet Take 1,000 Units by mouth 2 (two) times daily.     clonazePAM (KLONOPIN) 0.5 MG tablet TAKE 1 TABLET (0.5 MG TOTAL) BY MOUTH 3 (THREE) TIMES DAILY AS NEEDED FOR ANXIETY (AND INSOMNIA). 270 tablet 0   Cyanocobalamin (VITAMIN B 12 PO) Take by mouth.     denosumab (PROLIA) 60 MG/ML SOLN injection Inject 60 mg into the skin every 6 (six) months. Administer in upper arm, thigh, or abdomen     fish oil-omega-3 fatty acids 1000 MG capsule Take 1 g by mouth daily.     fluticasone (FLONASE) 50 MCG/ACT nasal spray Place 2 sprays into the nose daily.     fluticasone furoate-vilanterol (BREO ELLIPTA) 100-25 MCG/INH AEPB Inhale 1 puff into the lungs daily.     Glycerin-Hypromellose-PEG 400 ([facility] DRY EYE RELIEF OP) Apply 1 drop to eye at bedtime as needed (dry eyes).     hydrochlorothiazide (HYDRODIURIL) 25 MG tablet Take 25 mg by mouth daily.     IRON PO Take 1 tablet by mouth daily.     lamoTRIgine (LAMICTAL) 100 MG tablet Take 2 tablets (200 mg total) by mouth 2 (two) times daily. 60 tablet 0   magnesium gluconate (MAGONATE) 500 MG tablet Take 500 mg by mouth daily.     Multiple Vitamins-Minerals (COMPLETE MULTIVITAMIN/MINERAL PO) Take 1 tablet by mouth daily.     pantoprazole (PROTONIX) 40 MG tablet Take 40 mg by mouth daily.     tiZANidine (ZANAFLEX) 2 MG tablet Take 2 mg by mouth every 8 (eight) hours as  needed.     topiramate (TOPAMAX) 200 MG tablet Take 1 tablet (200 mg total) by mouth daily. (Patient taking differently: Take 50 mg by mouth daily.) 90 tablet 1   vitamin E 1000 UNIT capsule Take 1,000 Units by mouth daily.     vitamin k 100 MCG tablet Take  100 mcg by mouth daily.     No current facility-administered medications for this visit.    Medication Side Effects: None  Allergies:  Allergies  Allergen Reactions   Dilaudid [Hydromorphone Hcl] Other (See Comments)    Nightmares   Divalproex Sodium     Became comatose when she took Depakote and Geodon together.   Nsaids     "kidney problem"   Other Swelling    Nuts   Perphenazine-Amitriptyline Itching   Seroquel [Quetiapine Fumerate]     Past Medical History:  Diagnosis Date   Anxiety    Asthma    Bipolar disorder ([place])    Cataracts, bilateral    Depression    GERD (gastroesophageal reflux disease)    Hydatid cyst    Left   Hypertension    Kidney problem    RT kidney is smaller -> decreased output   Migraine    Osteoporosis 11/2016   T score -2.8  improved from prior DEXA   Right rotator cuff tear    Vertigo     Family History  Problem Relation Age of Onset   Hypertension Mother    Heart disease Mother    Stroke Mother    Diabetes Father    Hypertension Father    Heart disease Father    Stroke Father    Cancer Father        Lymphoma   Prostate cancer Brother    Prostate cancer Brother    Heart disease Brother    Stroke Paternal Uncle     Social History   Socioeconomic History   Marital status: Divorced    Spouse name: Not on file   Number of children: 1   Years of education: MSN   Highest education level: Not on file  Occupational History   Not on file  Tobacco Use   Smoking status: Never   Smokeless tobacco: Never  Vaping Use   Vaping Use: Never used  Substance and Sexual Activity   Alcohol use: Yes    Alcohol/week: 0.0 standard drinks of alcohol    Comment: Rare   Drug use: No    Sexual activity: Not Currently    Birth control/protection: Post-menopausal    Comment: 1st intercourse 71 yo-2 partners  Other Topics Concern   Not on file  Social History Narrative   Lives with husband and daughter   Caffeine use: 2 cups per day   Right handed   Social Determinants of Health   Financial Resource Strain: Not on file  Food Insecurity: Not on file  Transportation Needs: Not on file  Physical Activity: Not on file  Stress: Not on file  Social Connections: Not on file  Intimate Partner Violence: Not on file    Past Medical History, Surgical history, Social history, and Family history were reviewed and updated as appropriate.   Please see review of systems for further details on the patient's review from today.   Objective:   Physical Exam:  There were no vitals taken for this visit.  Physical Exam Constitutional:      General: She is not in acute distress. Musculoskeletal:        General: No deformity.  Neurological:     Mental Status: She is alert and oriented to person, place, and time.     Coordination: Coordination normal.  Psychiatric:        Attention and Perception: Attention and perception normal. She does not perceive auditory or visual hallucinations.  Mood and Affect: Mood is not anxious or depressed. Affect is not labile, blunt, angry or inappropriate.        Speech: Speech normal.        Behavior: Behavior normal.        Thought Content: Thought content normal. Thought content is not paranoid or delusional. Thought content does not include homicidal or suicidal ideation. Thought content does not include suicidal plan.        Cognition and Memory: Cognition and memory normal.        Judgment: Judgment normal.     Comments: Insight intact Not suicidal.   Neat and pleasant lonely     Lab Review:     Component Value Date/Time   NA 137 01/02/2020 1122   K 3.8 01/02/2020 1122   CL 105 01/02/2020 1122   CO2 23 01/02/2020 1122    GLUCOSE 114 (H) 01/02/2020 1122   BUN 27 (H) 01/02/2020 1122   CREATININE 1.58 (H) 01/02/2020 1122   CALCIUM 9.3 01/02/2020 1122   PROT 8.4 (H) 01/02/2020 1122   ALBUMIN 5.0 01/02/2020 1122   AST 28 01/02/2020 1122   ALT 19 01/02/2020 1122   ALKPHOS 91 01/02/2020 1122   BILITOT 0.7 01/02/2020 1122   GFRNONAA 33 (L) 01/02/2020 1122   GFRAA 39 (L) 01/02/2020 1122       Component Value Date/Time   WBC 6.0 01/02/2020 1122   RBC 4.51 01/02/2020 1122   HGB 13.6 01/02/2020 1122   HCT 40.5 01/02/2020 1122   PLT 210 01/02/2020 1122   MCV 89.8 01/02/2020 1122   MCH 30.2 01/02/2020 1122   MCHC 33.6 01/02/2020 1122   RDW 11.9 01/02/2020 1122   LYMPHSABS 0.8 01/02/2020 1122   MONOABS 0.3 01/02/2020 1122   EOSABS 0.1 01/02/2020 1122   BASOSABS 0.0 01/02/2020 1122    No results found for: "POCLITH", "LITHIUM"   No results found for: "PHENYTOIN", "PHENOBARB", "VALPROATE", "CBMZ"   .res Assessment: Plan:    Alenah was seen today for follow-up, anxiety and sleeping problem.  Diagnoses and all orders for this visit:  Mixed bipolar II disorder with rapid cycling ([place])  Insomnia due to mental condition  Generalized anxiety disorder    Greater than 50% of 30 min face to face time with patient was spent on counseling and coordination of care. We discussed Patient with a long history of bipolar disorder with rapid cycling that has been somewhat difficult to treat for a variety of reasons.  These include confounding migraine headaches which require a lot of medication and the fact that she is medication sensitive as well as having failed multiple medications.  In addition she refuses meds that can cause weight gain which greatly limit options.  2021 summer she was psych hospitalized which was originally attributed to an intentional overdose which the patient strongly denies.  There is no evidence that she intentionally overdosed or had any suicidal intent.  She is not currently suicidal and  has not been her recent history.  She has been very overwhelmed with caring for her mother's estate by herself and stressed by grief.  She has started counseling which is helpful.  Supportive therapy dealing with grief over B's death and just started new job.  Barely recovering with shakiness and feeling unsteady thinking about what happened and family implications of estate sale of family home.   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  Encourage still work on the grief. Tired of the emotional pain.  She has recently reduced a number of the anticonvulsants that were used to help her migraine headaches.   She is not currently confused.  She is not a acutely depressed but more stressed.  She is not manic.  She has no psychosis.  There is no evidence of substance abuse. Off HA meds several months and doing OK.  She has had some recent anxiety and has decided to stay on the clonazepam and restart lamotrigine.  She is not having any manic or depressive symptoms at this time.  We discussed the short-term risks associated with benzodiazepines including sedation and increased fall risk among others.  Discussed long-term side effect risk including dependence, potential withdrawal symptoms, and the potential eventual dose-related risk of dementia.  But recent studies from 2020 dispute this association between benzodiazepines and dementia risk. Newer studies in 2020 do not support an association with dementia.  Disc this again with detail.  Disc try gradually reduce clonazepam.  Contingue clonazepam Taking 1.5 mg HS. It has some mood stabilizing effects and she has restarted the lamotrigine 100 mg twice daily.  She agrees to continue these 2 medications.  She agrees. Disc risk more cycling without another stabilizer.  Hydroxyzine trial 10-20 mg instead of trazodone Or samples Belsomra given     FU 3-4 mos  Lynder Parents MD, DFAPA  Please see After Visit Summary for patient specific  instructions.  No future appointments.   No orders of the defined types were placed in this encounter.   -------------------------------

## 2021-12-04 ENCOUNTER — Telehealth: Payer: Self-pay | Admitting: Psychiatry

## 2021-12-04 NOTE — Telephone Encounter (Signed)
Pt LVM asking for refill of Belsomra to [facility].  Next appt 9/21

## 2021-12-04 NOTE — Telephone Encounter (Signed)
I don't see it on her med list unless she has been getting samples,please let me know what to pend

## 2021-12-05 NOTE — Telephone Encounter (Signed)
I gave her samples of doses 15 and 20 mg.  Ask her which dosage helped her so we can know what to send in.

## 2021-12-06 ENCOUNTER — Other Ambulatory Visit: Payer: Self-pay | Admitting: Psychiatry

## 2021-12-06 MED ORDER — BELSOMRA 20 MG PO TABS: 1.0000 | ORAL_TABLET | Freq: Every evening | ORAL | 0 refills | Status: DC

## 2021-12-06 NOTE — Telephone Encounter (Signed)
She said 20 mg was helpful

## 2021-12-06 NOTE — Telephone Encounter (Signed)
Sent Belsomra to [facility]

## 2021-12-09 ENCOUNTER — Other Ambulatory Visit: Payer: Self-pay

## 2021-12-09 ENCOUNTER — Telehealth: Payer: Self-pay | Admitting: Psychiatry

## 2021-12-09 NOTE — Telephone Encounter (Addendum)
Pt called requesting Rx for Trazodone 100 mg 2 tabs every night to [facility]. Apt 9/21

## 2021-12-09 NOTE — Telephone Encounter (Signed)
Pt stated she stopped taking hydroxyzine because it did not help at all.She wants trazodone sent in

## 2021-12-11 ENCOUNTER — Other Ambulatory Visit: Payer: Self-pay | Admitting: Psychiatry

## 2021-12-11 DIAGNOSIS — F5105 Insomnia due to other mental disorder: Secondary | ICD-10-CM

## 2021-12-11 MED ORDER — TRAZODONE HCL 100 MG PO TABS: 200.0000 mg | ORAL_TABLET | Freq: Every evening | ORAL | 0 refills | Status: DC | PRN

## 2021-12-11 NOTE — Telephone Encounter (Signed)
I sent in a refill request for the trazodone.  She can still retry the hydroxyzine at 3 or 4 tablets at night.  I gave her the lowest dosage

## 2021-12-16 DIAGNOSIS — L818 Other specified disorders of pigmentation: Secondary | ICD-10-CM | POA: Diagnosis not present

## 2021-12-16 DIAGNOSIS — L258 Unspecified contact dermatitis due to other agents: Secondary | ICD-10-CM | POA: Diagnosis not present

## 2021-12-25 ENCOUNTER — Other Ambulatory Visit: Payer: Self-pay | Admitting: Psychiatry

## 2021-12-25 DIAGNOSIS — F5105 Insomnia due to other mental disorder: Secondary | ICD-10-CM

## 2022-01-20 DIAGNOSIS — F5101 Primary insomnia: Secondary | ICD-10-CM | POA: Diagnosis not present

## 2022-01-20 DIAGNOSIS — N183 Chronic kidney disease, stage 3 unspecified: Secondary | ICD-10-CM | POA: Diagnosis not present

## 2022-01-20 DIAGNOSIS — I129 Hypertensive chronic kidney disease with stage 1 through stage 4 chronic kidney disease, or unspecified chronic kidney disease: Secondary | ICD-10-CM | POA: Diagnosis not present

## 2022-01-20 DIAGNOSIS — F319 Bipolar disorder, unspecified: Secondary | ICD-10-CM | POA: Diagnosis not present

## 2022-01-20 DIAGNOSIS — J4530 Mild persistent asthma, uncomplicated: Secondary | ICD-10-CM | POA: Diagnosis not present

## 2022-01-26 ENCOUNTER — Other Ambulatory Visit: Payer: Self-pay | Admitting: Psychiatry

## 2022-01-26 DIAGNOSIS — F3181 Bipolar II disorder: Secondary | ICD-10-CM

## 2022-01-30 ENCOUNTER — Other Ambulatory Visit: Payer: Self-pay | Admitting: Psychiatry

## 2022-01-30 DIAGNOSIS — F411 Generalized anxiety disorder: Secondary | ICD-10-CM

## 2022-01-31 NOTE — Telephone Encounter (Signed)
Pt called checking on the status of her refills. Would like them by saturday

## 2022-01-31 NOTE — Telephone Encounter (Signed)
Filled 10/14/21 appt 9/21

## 2022-02-03 ENCOUNTER — Telehealth: Payer: Self-pay | Admitting: Psychiatry

## 2022-02-03 DIAGNOSIS — F3181 Bipolar II disorder: Secondary | ICD-10-CM

## 2022-02-03 MED ORDER — LAMOTRIGINE 100 MG PO TABS: 100.0000 mg | ORAL_TABLET | Freq: Two times a day (BID) | ORAL | 0 refills | Status: DC

## 2022-02-03 NOTE — Telephone Encounter (Signed)
Pt leaving in an hour and needs her lamicatal filled ASAP she called on Friday. Please send [facility] to the [facility] on [address] in [place]

## 2022-02-03 NOTE — Telephone Encounter (Signed)
Patient should have about 2 weeks of medication left. [place]. She is going to [place] today for a week and asked me to send in a one week supply and she will pay out of pocket. Rx sent.

## 2022-02-14 DIAGNOSIS — M85852 Other specified disorders of bone density and structure, left thigh: Secondary | ICD-10-CM | POA: Diagnosis not present

## 2022-02-14 DIAGNOSIS — M85851 Other specified disorders of bone density and structure, right thigh: Secondary | ICD-10-CM | POA: Diagnosis not present

## 2022-02-14 DIAGNOSIS — Z1231 Encounter for screening mammogram for malignant neoplasm of breast: Secondary | ICD-10-CM | POA: Diagnosis not present

## 2022-02-16 ENCOUNTER — Other Ambulatory Visit: Payer: Self-pay | Admitting: Psychiatry

## 2022-02-16 DIAGNOSIS — F5105 Insomnia due to other mental disorder: Secondary | ICD-10-CM

## 2022-02-20 ENCOUNTER — Other Ambulatory Visit: Payer: Self-pay | Admitting: Psychiatry

## 2022-02-20 DIAGNOSIS — F5105 Insomnia due to other mental disorder: Secondary | ICD-10-CM

## 2022-02-20 DIAGNOSIS — F3181 Bipolar II disorder: Secondary | ICD-10-CM

## 2022-02-20 MED ORDER — LAMOTRIGINE 100 MG PO TABS: 100.0000 mg | ORAL_TABLET | Freq: Two times a day (BID) | ORAL | 1 refills | Status: DC

## 2022-02-25 DIAGNOSIS — R7611 Nonspecific reaction to tuberculin skin test without active tuberculosis: Secondary | ICD-10-CM | POA: Diagnosis not present

## 2022-02-27 ENCOUNTER — Ambulatory Visit: Payer: Medicare (Managed Care) | Admitting: Psychiatry

## 2022-03-03 DIAGNOSIS — L821 Other seborrheic keratosis: Secondary | ICD-10-CM | POA: Diagnosis not present

## 2022-03-03 DIAGNOSIS — L908 Other atrophic disorders of skin: Secondary | ICD-10-CM | POA: Diagnosis not present

## 2022-03-05 DIAGNOSIS — R399 Unspecified symptoms and signs involving the genitourinary system: Secondary | ICD-10-CM | POA: Diagnosis not present

## 2022-03-05 DIAGNOSIS — M5136 Other intervertebral disc degeneration, lumbar region: Secondary | ICD-10-CM | POA: Diagnosis not present

## 2022-03-07 DIAGNOSIS — M810 Age-related osteoporosis without current pathological fracture: Secondary | ICD-10-CM | POA: Diagnosis not present

## 2022-03-09 ENCOUNTER — Other Ambulatory Visit: Payer: Self-pay | Admitting: Psychiatry

## 2022-03-10 NOTE — Telephone Encounter (Signed)
Please schedule appt

## 2022-03-11 NOTE — Telephone Encounter (Signed)
Pt is scheduled 10/31

## 2022-03-12 NOTE — Telephone Encounter (Signed)
Filled 7/8

## 2022-04-08 ENCOUNTER — Ambulatory Visit (INDEPENDENT_AMBULATORY_CARE_PROVIDER_SITE_OTHER): Payer: Medicare (Managed Care) | Admitting: Psychiatry

## 2022-04-08 ENCOUNTER — Encounter: Payer: Self-pay | Admitting: Psychiatry

## 2022-04-08 DIAGNOSIS — F3181 Bipolar II disorder: Secondary | ICD-10-CM

## 2022-04-08 DIAGNOSIS — F411 Generalized anxiety disorder: Secondary | ICD-10-CM

## 2022-04-08 DIAGNOSIS — F5105 Insomnia due to other mental disorder: Secondary | ICD-10-CM | POA: Diagnosis not present

## 2022-04-08 DIAGNOSIS — G43009 Migraine without aura, not intractable, without status migrainosus: Secondary | ICD-10-CM

## 2022-04-08 MED ORDER — LAMOTRIGINE 100 MG PO TABS: 200.0000 mg | ORAL_TABLET | Freq: Two times a day (BID) | ORAL | 1 refills | Status: DC

## 2022-04-08 MED ORDER — BELSOMRA 20 MG PO TABS: 1.0000 | ORAL_TABLET | Freq: Every day | ORAL | 0 refills | Status: DC

## 2022-04-08 MED ORDER — CLONAZEPAM 0.5 MG PO TABS: 0.5000 mg | ORAL_TABLET | Freq: Three times a day (TID) | ORAL | 0 refills | Status: DC | PRN

## 2022-04-08 NOTE — Progress Notes (Signed)
Taressa Rauh Mclin [id number] 06-19-1950 71 y.o.   Subjective:   Patient ID:  Phillipa Morden Large is a 71 y.o. (DOB 10/26/1950) female.  Chief Complaint:  Chief Complaint  Patient presents with   Follow-up    Mixed bipolar II disorder with rapid cycling ([place])   Anxiety   Sleeping Problem    Depression        Associated symptoms include headaches.  Past medical history includes anxiety.   Anxiety Symptoms include nervous/anxious behavior. Patient reports no dizziness or palpitations.     Emberlie Gotcher Silbaugh presents to the office today for follow-up of bipolar disorder.  hospitalization July 2021 for alleged intentional overdose of medications.  Discharged 01/02/2020 on clonazepam 0.5 mg 3 times daily as needed, lamotrigine 400 mg twice daily, topiramate 200 mg nightly, trazodone 300 mg nightly, and ziprasidone 40 mg twice daily, and zonisamide 300 mg daily.  12/01/2019 appointment notable for patient having seen significant mood stability and reduction of manic symptoms and depression with an increase in lamotrigine and the addition of Geodon 20 mg twice daily.  No meds were changed.  01/18/20 appt with the following noted: Disc recent hospitalization.  Said she felt weak the day of hospitalization and grief and got distraught.  She denied over taking meds and got pulled over by police for driving erratically.   She denies intentional overdose but she was dehydrated. Upset over being at behavioral health hosp of 2 days. Lamotrigine reduced to 1 daily. Reduced Zonesimide gradually per MD. Reduced topiramate to 100 mg daily. Says she ate normally that day.   Says she's always been sensitive to Geodon but is doing OK with it right now.  No other explanation for what happened that day except she was distraught and overwhelmed with mother's estate. Started grief therapy at Hospice. Plan no med changes as recently hospitalized  02/10/2020 appt with the following noted: CO weight gain and thinks it  is Geodon.  Gained weight gain on Seroquel and hasn't lost it.  Wants to wean off clonazepam.  Has cut so many meds back dramatically. BP has come back to normal. I feel really good.  Calmer and more focused.  No brain fog.  No sadness.  Don't get as hyper.  Anxiety not out of control.  Better overall. No HA.  Meditation and therapy and it helped to wean off migraine meds.   Now more ready to let go of clonazepam.   Sleep variable.  Get wound up at work and can have a hard time sleeping but not too bad. Plan: Disc her desire to taper clonazepam.  Need to protect sleep.  Taking 1.5 mg HS. Reduce no faster than 0.25 mg reduction per month.   02/22/2020 phone call from patient complaining of 10 pound weight gain since January 04, 2020 when starting Geodon 40 mg twice daily. MD response:She has tried all of the mood stabilizers with the lowest weight gain risk.  She is not overweight.  But she is very focused on her weight.  The only alternative I see that she is not taking with low weight gain risk is haloperidol and typically ziprasidone or Geodon has even lower weight gain risk than haloperidol but there is individual variation. She had a psychiatric hospitalization on Geodon 20 mg twice daily so I would not be in favor of reducing the dose below Geodon 40 mg twice daily.  03/19/20 appt with following noted: Took herself off the Geodon bc jitters and weight gain. Restarted lamotrigine  at 100 mg BID. Has gained from 128# to 155 # and she attributes to meds including quetiapine and perphenazine. Some anxiety.  Still on clonazepam 1.5 mg daily.  Occ trazodone prn. Patient reports stable mood and denies depressed or irritable moods.   Patient denies difficulty with sleep initiation or maintenance. Denies appetite disturbance.  Patient reports that energy and motivation have been good.  Patient denies any difficulty with concentration.  Patient denies any suicidal ideation.  07/18/2020 appt with following  noted: New job end of December.  Doing well with it. Agency directoer and DON. Ongoing problem with weight gain 14#/6 mos.  CO worse.  Doesn't know why.  On [employer], walking.  Taking clonazepam 1.5 mg HS. "Probably not enough sleep", but sleeps 8-4.  Same sleep pattern for several years. No sig periods of hypomania except brief after mother's death. Patient reports stable mood and denies depressed or irritable moods.  Patient denies any recent difficulty with anxiety.  Patient denies difficulty with sleep initiation or maintenance. Denies appetite disturbance.  Patient reports that energy and motivation have been good.  Patient denies any difficulty with concentration.  Patient denies any suicidal ideation. Plan: Contingue clonazepam Taking 1.5 mg HS. It has some mood stabilizing effects and she has restarted the lamotrigine 100 mg twice daily.  She agrees to continue these 2 medications.  She agrees.  1/10/20023 appt noted: B and M passed away.  Called in 04-28-23 crying and with insomnia. Rough time.  She had driven need to back track on knowing everything about what happened before brother died.  Did same thing after M died.  Couldn't let it go and obsessed on it. Tried Hospice without help. Poor sleep.  Initial with mind racing on deaths and what she needs to do. Taking klonopin 1.5 mg HS, trazodone 100 mg HS. Recognizes couldn't focus and memory issues after brother's death. Plan: Contingue clonazepam Taking 1.5 mg HS. It has some mood stabilizing effects and she has restarted the lamotrigine 100 mg twice daily.  She agrees to continue these 2 medications.  She agrees. Trial Caplyta 42 mg for bipolar depression and chronic insomnia.  06/21/2021 TC saying Caplyta was working.  07/04/2021 appt noted: The way my life is will be hard to not have some depression.  Not sadness.  Caplyta seems to work for sx so far and seems less down.  On it for 2 weeks.  Can't tell about weight gain yet. Exercising and  diet healthier. Sleep is better with Caplyta.  Thinks it is helping so far. Not sure the cost yet.   Uncle died and going to [facility].   Sx of loneliness but has friends and family.  Feels lonely even in big group.  Still dealing with grief with M and B.   Plan: Contingue clonazepam Taking 1.5 mg HS. It has some mood stabilizing effects and she has restarted the lamotrigine 100 mg twice daily.  She agrees to continue these 2 medications.  She agrees. Continue Caplyta 42 mg for bipolar depression and chronic insomnia. Trazodone prn  08/14/2021 phone call saying she was no longer taking Caplyta because it was too expensive and also because she feared weight gain with it. 08/21/2021 phone call complaining of insomnia and wanting to increase trazodone.  Okay to increase trazodone to 200 mg nightly  11/27/2021 appointment with the following noted: Still dealing with HA.  Dr. Domingo Cocking, he increased to 200 mg daily. Lost 27# since 28-Apr-2023. B died in 28-Mar-2023 and started  her on spiral down.  Other B dx prostate CA.  Worried over him.  I was such a wreck.  Nephew died of drugs at 59. Just back from  [place] a week ago. Trouble sleeping since October; initial and terminal.  To bed 8 and up 430.  Falling asleep 10 and awake at 3.   Down to clonazepam 0.5 mg 2 at night.  Anxiety better with exercise.  Has dumped a couple of BF and niece said she's needing therapy.  Not sure why. Better with grief now than she was.  Not depressed.  Minimal manic sx Plan: Contingue clonazepam Taking 1.5 mg HS. It has some mood stabilizing effects and she has restarted the lamotrigine 100 mg twice daily.  She agrees to continue these 2 medications.  She agrees. Disc risk more cycling without another stabilizer. Hydroxyzine trial 10-20 mg instead of trazodone Or samples Belsomra given    04/08/22 appt noted: Current psychiatry relevant meds include Belsomra 20 mg nightly, clonazepam 0.5 mg tablets 3 nightly, lamotrigine 100 mg twice  daily, trazodone 200 mg nightly, topiramate reduced to 50 mg daily Was doing pretty well until Oct which is hard bc anniversary of brother's death.   She asked about increasing lamotrigine back up.   Did ok with sleep awhile at 2 clonazepam at night but then as fall approached then needed to go back to 3 of 0.5 mg HS to sleep.  Hard time falling asleep and turning off thoughts. No mood swings.   Working PT job.  Doing some shift work.   Past Psychiatric Medication Trials: Vraylar early weight gain.  Perphenazine 8 mg, quetiapine 300 mg, Abilify 10,  Geodon 40 BID,  Risperidone, latuda 5# weight gain Caplyta cost problems Lithium, Depakote, CBZ 300 sleepiness. Lamotrigine 300  Mirtazapine wt gain  Trazodone 200 NR, hydroxyzine NR, Belsomra 20 paroxetine, Lexapro, sertraline, duloxetine 120,  Psychiatric hospitalizations summer 2021  Review of Systems:  Review of Systems  Constitutional:  Positive for unexpected weight change.  Cardiovascular:  Negative for palpitations.  Neurological:  Positive for headaches. Negative for dizziness, tremors, weakness and light-headedness.  Psychiatric/Behavioral:  Positive for dysphoric mood and sleep disturbance. The patient is nervous/anxious.     Medications: I have reviewed the patient's current medications.  Current Outpatient Medications  Medication Sig Dispense Refill   albuterol (VENTOLIN HFA) 108 (90 Base) MCG/ACT inhaler      amLODipine (NORVASC) 10 MG tablet Take 1 tablet (10 mg total) by mouth daily. 30 tablet 0   Ascorbic Acid (VITAMIN C PO) Take 1 tablet by mouth daily.     CALCIUM PO Take 1 tablet by mouth daily.     cholecalciferol (VITAMIN D) 1000 UNITS tablet Take 1,000 Units by mouth 2 (two) times daily.     Cyanocobalamin (VITAMIN B 12 PO) Take by mouth.     denosumab (PROLIA) 60 MG/ML SOLN injection Inject 60 mg into the skin every 6 (six) months. Administer in upper arm, thigh, or abdomen     fish oil-omega-3 fatty acids 1000  MG capsule Take 1 g by mouth daily.     fluticasone (FLONASE) 50 MCG/ACT nasal spray Place 2 sprays into the nose daily.     fluticasone furoate-vilanterol (BREO ELLIPTA) 100-25 MCG/INH AEPB Inhale 1 puff into the lungs daily.     Glycerin-Hypromellose-PEG 400 ([facility] DRY EYE RELIEF OP) Apply 1 drop to eye at bedtime as needed (dry eyes).     hydrochlorothiazide (HYDRODIURIL) 25 MG tablet Take 25 mg  by mouth daily.     IRON PO Take 1 tablet by mouth daily.     magnesium gluconate (MAGONATE) 500 MG tablet Take 500 mg by mouth daily.     Multiple Vitamins-Minerals (COMPLETE MULTIVITAMIN/MINERAL PO) Take 1 tablet by mouth daily.     pantoprazole (PROTONIX) 40 MG tablet Take 40 mg by mouth daily.     tiZANidine (ZANAFLEX) 2 MG tablet Take 2 mg by mouth every 8 (eight) hours as needed.     topiramate (TOPAMAX) 200 MG tablet Take 1 tablet (200 mg total) by mouth daily. (Patient taking differently: Take 50 mg by mouth daily.) 90 tablet 1   traZODone (DESYREL) 100 MG tablet TAKE 2 TABLETS AT BEDTIME AS NEEDED FOR SLEEP 180 tablet 3   vitamin E 1000 UNIT capsule Take 1,000 Units by mouth daily.     vitamin k 100 MCG tablet Take 100 mcg by mouth daily.     clonazePAM (KLONOPIN) 0.5 MG tablet Take 1 tablet (0.5 mg total) by mouth 3 (three) times daily as needed for anxiety (and insomnia). 270 tablet 0   lamoTRIgine (LAMICTAL) 100 MG tablet Take 2 tablets (200 mg total) by mouth 2 (two) times daily. 360 tablet 1   Suvorexant (BELSOMRA) 20 MG TABS Take 1 tablet by mouth at bedtime. 90 tablet 0   No current facility-administered medications for this visit.    Medication Side Effects: None  Allergies:  Allergies  Allergen Reactions   Dilaudid [Hydromorphone Hcl] Other (See Comments)    Nightmares   Divalproex Sodium     Became comatose when she took Depakote and Geodon together.   Nsaids     "kidney problem"   Other Swelling    Nuts   Perphenazine-Amitriptyline Itching   Seroquel [Quetiapine  Fumerate]     Past Medical History:  Diagnosis Date   Anxiety    Asthma    Bipolar disorder ([place])    Cataracts, bilateral    Depression    GERD (gastroesophageal reflux disease)    Hydatid cyst    Left   Hypertension    Kidney problem    RT kidney is smaller -> decreased output   Migraine    Osteoporosis 11/2016   T score -2.8  improved from prior DEXA   Right rotator cuff tear    Vertigo     Family History  Problem Relation Age of Onset   Hypertension Mother    Heart disease Mother    Stroke Mother    Diabetes Father    Hypertension Father    Heart disease Father    Stroke Father    Cancer Father        Lymphoma   Prostate cancer Brother    Prostate cancer Brother    Heart disease Brother    Stroke Paternal Uncle     Social History   Socioeconomic History   Marital status: Divorced    Spouse name: Not on file   Number of children: 1   Years of education: MSN   Highest education level: Not on file  Occupational History   Not on file  Tobacco Use   Smoking status: Never   Smokeless tobacco: Never  Vaping Use   Vaping Use: Never used  Substance and Sexual Activity   Alcohol use: Yes    Alcohol/week: 0.0 standard drinks of alcohol    Comment: Rare   Drug use: No   Sexual activity: Not Currently    Birth control/protection: Post-menopausal  Comment: 1st intercourse 36 yo-2 partners  Other Topics Concern   Not on file  Social History Narrative   Lives with husband and daughter   Caffeine use: 2 cups per day   Right handed   Social Determinants of Health   Financial Resource Strain: Not on file  Food Insecurity: Not on file  Transportation Needs: Not on file  Physical Activity: Not on file  Stress: Not on file  Social Connections: Not on file  Intimate Partner Violence: Not on file    Past Medical History, Surgical history, Social history, and Family history were reviewed and updated as appropriate.   Please see review of systems for  further details on the patient's review from today.   Objective:   Physical Exam:  There were no vitals taken for this visit.  Physical Exam Constitutional:      General: She is not in acute distress. Musculoskeletal:        General: No deformity.  Neurological:     Mental Status: She is alert and oriented to person, place, and time.     Coordination: Coordination normal.  Psychiatric:        Attention and Perception: Attention and perception normal. She does not perceive auditory or visual hallucinations.        Mood and Affect: Mood is not anxious or depressed. Affect is not labile, blunt, angry or inappropriate.        Speech: Speech normal.        Behavior: Behavior normal.        Thought Content: Thought content normal. Thought content is not paranoid or delusional. Thought content does not include homicidal or suicidal ideation. Thought content does not include suicidal plan.        Cognition and Memory: Cognition and memory normal.        Judgment: Judgment normal.     Comments: Insight intact Not suicidal.   Neat and pleasant lonely     Lab Review:     Component Value Date/Time   NA 137 01/02/2020 1122   K 3.8 01/02/2020 1122   CL 105 01/02/2020 1122   CO2 23 01/02/2020 1122   GLUCOSE 114 (H) 01/02/2020 1122   BUN 27 (H) 01/02/2020 1122   CREATININE 1.58 (H) 01/02/2020 1122   CALCIUM 9.3 01/02/2020 1122   PROT 8.4 (H) 01/02/2020 1122   ALBUMIN 5.0 01/02/2020 1122   AST 28 01/02/2020 1122   ALT 19 01/02/2020 1122   ALKPHOS 91 01/02/2020 1122   BILITOT 0.7 01/02/2020 1122   GFRNONAA 33 (L) 01/02/2020 1122   GFRAA 39 (L) 01/02/2020 1122       Component Value Date/Time   WBC 6.0 01/02/2020 1122   RBC 4.51 01/02/2020 1122   HGB 13.6 01/02/2020 1122   HCT 40.5 01/02/2020 1122   PLT 210 01/02/2020 1122   MCV 89.8 01/02/2020 1122   MCH 30.2 01/02/2020 1122   MCHC 33.6 01/02/2020 1122   RDW 11.9 01/02/2020 1122   LYMPHSABS 0.8 01/02/2020 1122   MONOABS  0.3 01/02/2020 1122   EOSABS 0.1 01/02/2020 1122   BASOSABS 0.0 01/02/2020 1122    No results found for: "POCLITH", "LITHIUM"   No results found for: "PHENYTOIN", "PHENOBARB", "VALPROATE", "CBMZ"   .res Assessment: Plan:    [place] was seen today for follow-up, anxiety and sleeping problem.  Diagnoses and all orders for this visit:  Mixed bipolar II disorder with rapid cycling ([facility]) -     clonazePAM (KLONOPIN) 0.5 MG  tablet; Take 1 tablet (0.5 mg total) by mouth 3 (three) times daily as needed for anxiety (and insomnia). -     lamoTRIgine (LAMICTAL) 100 MG tablet; Take 2 tablets (200 mg total) by mouth 2 (two) times daily.  Insomnia due to mental condition -     clonazePAM (KLONOPIN) 0.5 MG tablet; Take 1 tablet (0.5 mg total) by mouth 3 (three) times daily as needed for anxiety (and insomnia). -     Suvorexant (BELSOMRA) 20 MG TABS; Take 1 tablet by mouth at bedtime.  Generalized anxiety disorder -     clonazePAM (KLONOPIN) 0.5 MG tablet; Take 1 tablet (0.5 mg total) by mouth 3 (three) times daily as needed for anxiety (and insomnia).  Migraine without aura and without status migrainosus, not intractable    Greater than 50% of 30 min face to face time with patient was spent on counseling and coordination of care. We discussed Patient with a long history of bipolar disorder with rapid cycling that has been somewhat difficult to treat for a variety of reasons.  These include confounding migraine headaches which require a lot of medication and the fact that she is medication sensitive as well as having failed multiple medications.  In addition she refuses meds that can cause weight gain which greatly limit options.  2021 summer she was psych hospitalized which was originally attributed to an intentional overdose which the patient strongly denies.  There is no evidence that she intentionally overdosed or had any suicidal intent.  She is not currently suicidal and has not been her recent  history.  She has been very overwhelmed with caring for her mother's estate by herself and stressed by grief.  She has started counseling which is helpful.  She has recently reduced a number of the anticonvulsants that were used to help her migraine headaches.   She is not currently confused.  She is not a acutely depressed but more stressed.  She is not manic.  She has no psychosis.  There is no evidence of substance abuse. HA manageable.  We discussed the short-term risks associated with benzodiazepines including sedation and increased fall risk among others.  Discussed long-term side effect risk including dependence, potential withdrawal symptoms, and the potential eventual dose-related risk of dementia.  But recent studies from 2020 dispute this association between benzodiazepines and dementia risk. Newer studies in 2020 do not support an association with dementia.  Disc this again with detail.  Disc try gradually reduce clonazepam.  Contingue clonazepam Taking 1.5 mg HS. It has some mood stabilizing effects and she has restarted the lamotrigine 100 mg twice daily.  She agrees to continue these 2 medications.  She agrees. Disc risk more cycling without another stabilizer.  Continue Belsomra 20 mg HS  Ok increase lamotrigine as noted: Increase lamotrigine to 1 and 1/2 tablets twice daily for 2 weeks then 2 tablets twice daily.  FU 3-4 mos  Lynder Parents MD, DFAPA  Please see After Visit Summary for patient specific instructions.  Future Appointments  Date Time Provider [place]  08/11/2022  1:30 PM Cottle, Billey Co., MD [facility] None     No orders of the defined types were placed in this encounter.   -------------------------------

## 2022-04-08 NOTE — Patient Instructions (Addendum)
Increase lamotrigine to 1 and 1/2 tablets twice daily for 2 weeks then 2 tablets twice daily.

## 2022-05-23 DIAGNOSIS — M25511 Pain in right shoulder: Secondary | ICD-10-CM | POA: Diagnosis not present

## 2022-05-26 DIAGNOSIS — G43719 Chronic migraine without aura, intractable, without status migrainosus: Secondary | ICD-10-CM | POA: Diagnosis not present

## 2022-06-11 ENCOUNTER — Other Ambulatory Visit: Payer: Self-pay | Admitting: Psychiatry

## 2022-06-11 DIAGNOSIS — F5105 Insomnia due to other mental disorder: Secondary | ICD-10-CM

## 2022-06-12 ENCOUNTER — Other Ambulatory Visit: Payer: Self-pay

## 2022-06-12 DIAGNOSIS — F5105 Insomnia due to other mental disorder: Secondary | ICD-10-CM

## 2022-06-12 MED ORDER — TRAZODONE HCL 100 MG PO TABS: ORAL_TABLET | ORAL | 0 refills | Status: DC

## 2022-08-06 ENCOUNTER — Other Ambulatory Visit: Payer: Self-pay | Admitting: Psychiatry

## 2022-08-06 DIAGNOSIS — I129 Hypertensive chronic kidney disease with stage 1 through stage 4 chronic kidney disease, or unspecified chronic kidney disease: Secondary | ICD-10-CM | POA: Diagnosis not present

## 2022-08-06 DIAGNOSIS — N183 Chronic kidney disease, stage 3 unspecified: Secondary | ICD-10-CM | POA: Diagnosis not present

## 2022-08-06 DIAGNOSIS — M5136 Other intervertebral disc degeneration, lumbar region: Secondary | ICD-10-CM | POA: Diagnosis not present

## 2022-08-06 DIAGNOSIS — F5105 Insomnia due to other mental disorder: Secondary | ICD-10-CM

## 2022-08-06 DIAGNOSIS — Z0000 Encounter for general adult medical examination without abnormal findings: Secondary | ICD-10-CM | POA: Diagnosis not present

## 2022-08-06 DIAGNOSIS — M810 Age-related osteoporosis without current pathological fracture: Secondary | ICD-10-CM | POA: Diagnosis not present

## 2022-08-06 DIAGNOSIS — K219 Gastro-esophageal reflux disease without esophagitis: Secondary | ICD-10-CM | POA: Diagnosis not present

## 2022-08-06 DIAGNOSIS — J301 Allergic rhinitis due to pollen: Secondary | ICD-10-CM | POA: Diagnosis not present

## 2022-08-06 DIAGNOSIS — J4530 Mild persistent asthma, uncomplicated: Secondary | ICD-10-CM | POA: Diagnosis not present

## 2022-08-06 DIAGNOSIS — M1611 Unilateral primary osteoarthritis, right hip: Secondary | ICD-10-CM | POA: Diagnosis not present

## 2022-08-06 DIAGNOSIS — D509 Iron deficiency anemia, unspecified: Secondary | ICD-10-CM | POA: Diagnosis not present

## 2022-08-11 ENCOUNTER — Encounter: Payer: Self-pay | Admitting: Psychiatry

## 2022-08-11 ENCOUNTER — Ambulatory Visit: Payer: Medicare Other | Admitting: Psychiatry

## 2022-08-11 DIAGNOSIS — G43009 Migraine without aura, not intractable, without status migrainosus: Secondary | ICD-10-CM | POA: Diagnosis not present

## 2022-08-11 DIAGNOSIS — F3181 Bipolar II disorder: Secondary | ICD-10-CM

## 2022-08-11 DIAGNOSIS — F5105 Insomnia due to other mental disorder: Secondary | ICD-10-CM

## 2022-08-11 DIAGNOSIS — F411 Generalized anxiety disorder: Secondary | ICD-10-CM

## 2022-08-11 DIAGNOSIS — F4321 Adjustment disorder with depressed mood: Secondary | ICD-10-CM

## 2022-08-11 MED ORDER — BUPROPION HCL ER (XL) 150 MG PO TB24: ORAL_TABLET | ORAL | 0 refills | Status: DC

## 2022-08-11 MED ORDER — CLONAZEPAM 0.5 MG PO TABS: 0.5000 mg | ORAL_TABLET | Freq: Three times a day (TID) | ORAL | 0 refills | Status: DC | PRN

## 2022-08-11 MED ORDER — BUPROPION HCL ER (XL) 300 MG PO TB24: 300.0000 mg | ORAL_TABLET | Freq: Every day | ORAL | 0 refills | Status: DC

## 2022-08-11 NOTE — Progress Notes (Signed)
Kelli Morales [id number] 08/08/1950 72 y.o.   Subjective:   Patient ID:  Kelli Morales is a 72 y.o. (DOB 1951/03/28) female.  Chief Complaint:  Chief Complaint  Patient presents with   Follow-up   Depression   Anxiety    Depression        Associated symptoms include headaches.  Past medical history includes anxiety.   Anxiety Symptoms include nervous/anxious behavior. Patient reports no dizziness or palpitations.     Kelli Morales presents to the office today for follow-up of bipolar disorder.  hospitalization July 2021 for alleged intentional overdose of medications.  Discharged 01/02/2020 on clonazepam 0.5 mg 3 times daily as needed, lamotrigine 400 mg twice daily, topiramate 200 mg nightly, trazodone 300 mg nightly, and ziprasidone 40 mg twice daily, and zonisamide 300 mg daily.  12/01/2019 appointment notable for patient having seen significant mood stability and reduction of manic symptoms and depression with an increase in lamotrigine and the addition of Geodon 20 mg twice daily.  No meds were changed.  01/18/20 appt with the following noted: Disc recent hospitalization.  Said she felt weak the day of hospitalization and grief and got distraught.  She denied over taking meds and got pulled over by police for driving erratically.   She denies intentional overdose but she was dehydrated. Upset over being at behavioral health hosp of 2 days. Lamotrigine reduced to 1 daily. Reduced Zonesimide gradually per MD. Reduced topiramate to 100 mg daily. Says she ate normally that day.   Says she's always been sensitive to Geodon but is doing OK with it right now.  No other explanation for what happened that day except she was distraught and overwhelmed with mother's estate. Started grief therapy at Hospice. Plan no med changes as recently hospitalized  02/10/2020 appt with the following noted: CO weight gain and thinks it is Geodon.  Gained weight gain on Seroquel and hasn't lost  it.  Wants to wean off clonazepam.  Has cut so many meds back dramatically. BP has come back to normal. I feel really good.  Calmer and more focused.  No brain fog.  No sadness.  Don't get as hyper.  Anxiety not out of control.  Better overall. No HA.  Meditation and therapy and it helped to wean off migraine meds.   Now more ready to let go of clonazepam.   Sleep variable.  Get wound up at work and can have a hard time sleeping but not too bad. Plan: Disc her desire to taper clonazepam.  Need to protect sleep.  Taking 1.5 mg HS. Reduce no faster than 0.25 mg reduction per month.   02/22/2020 phone call from patient complaining of 10 pound weight gain since January 04, 2020 when starting Geodon 40 mg twice daily. MD response:She has tried all of the mood stabilizers with the lowest weight gain risk.  She is not overweight.  But she is very focused on her weight.  The only alternative I see that she is not taking with low weight gain risk is haloperidol and typically ziprasidone or Geodon has even lower weight gain risk than haloperidol but there is individual variation. She had a psychiatric hospitalization on Geodon 20 mg twice daily so I would not be in favor of reducing the dose below Geodon 40 mg twice daily.  03/19/20 appt with following noted: Took herself off the Geodon bc jitters and weight gain. Restarted lamotrigine at 100 mg BID. Has gained from 128# to 155 # and  she attributes to meds including quetiapine and perphenazine. Some anxiety.  Still on clonazepam 1.5 mg daily.  Occ trazodone prn. Patient reports stable mood and denies depressed or irritable moods.   Patient denies difficulty with sleep initiation or maintenance. Denies appetite disturbance.  Patient reports that energy and motivation have been good.  Patient denies any difficulty with concentration.  Patient denies any suicidal ideation.  07/18/2020 appt with following noted: New job end of December.  Doing well with it. Agency  directoer and DON. Ongoing problem with weight gain 14#/6 mos.  CO worse.  Doesn't know why.  On [employer], walking.  Taking clonazepam 1.5 mg HS. "Probably not enough sleep", but sleeps 8-4.  Same sleep pattern for several years. No sig periods of hypomania except brief after mother's death. Patient reports stable mood and denies depressed or irritable moods.  Patient denies any recent difficulty with anxiety.  Patient denies difficulty with sleep initiation or maintenance. Denies appetite disturbance.  Patient reports that energy and motivation have been good.  Patient denies any difficulty with concentration.  Patient denies any suicidal ideation. Plan: Contingue clonazepam Taking 1.5 mg HS. It has some mood stabilizing effects and she has restarted the lamotrigine 100 mg twice daily.  She agrees to continue these 2 medications.  She agrees.  1/10/20023 appt noted: B and M passed away.  Called in May 06, 2023 crying and with insomnia. Rough time.  She had driven need to back track on knowing everything about what happened before brother died.  Did same thing after M died.  Couldn't let it go and obsessed on it. Tried Hospice without help. Poor sleep.  Initial with mind racing on deaths and what she needs to do. Taking klonopin 1.5 mg HS, trazodone 100 mg HS. Recognizes couldn't focus and memory issues after brother's death. Plan: Contingue clonazepam Taking 1.5 mg HS. It has some mood stabilizing effects and she has restarted the lamotrigine 100 mg twice daily.  She agrees to continue these 2 medications.  She agrees. Trial Caplyta 42 mg for bipolar depression and chronic insomnia.  06/21/2021 TC saying Caplyta was working.  07/04/2021 appt noted: The way my life is will be hard to not have some depression.  Not sadness.  Caplyta seems to work for sx so far and seems less down.  On it for 2 weeks.  Can't tell about weight gain yet. Exercising and diet healthier. Sleep is better with Caplyta.  Thinks it is  helping so far. Not sure the cost yet.   Uncle died and going to [place].   Sx of loneliness but has friends and family.  Feels lonely even in big group.  Still dealing with grief with M and B.   Plan: Contingue clonazepam Taking 1.5 mg HS. It has some mood stabilizing effects and she has restarted the lamotrigine 100 mg twice daily.  She agrees to continue these 2 medications.  She agrees. Continue Caplyta 42 mg for bipolar depression and chronic insomnia. Trazodone prn  08/14/2021 phone call saying she was no longer taking Caplyta because it was too expensive and also because she feared weight gain with it. 08/21/2021 phone call complaining of insomnia and wanting to increase trazodone.  Okay to increase trazodone to 200 mg nightly  11/27/2021 appointment with the following noted: Still dealing with HA.  Dr. Domingo Cocking, he increased to 200 mg daily. Lost 27# since 2023/05/06. B died in 04-05-23 and started her on spiral down.  Other B dx prostate CA.  Worried  over him.  I was such a wreck.  Nephew died of drugs at 74. Just back from  [place] a week ago. Trouble sleeping since October; initial and terminal.  To bed 8 and up 430.  Falling asleep 10 and awake at 3.   Down to clonazepam 0.5 mg 2 at night.  Anxiety better with exercise.  Has dumped a couple of BF and niece said she's needing therapy.  Not sure why. Better with grief now than she was.  Not depressed.  Minimal manic sx Plan: Contingue clonazepam Taking 1.5 mg HS. It has some mood stabilizing effects and she has restarted the lamotrigine 100 mg twice daily.  She agrees to continue these 2 medications.  She agrees. Disc risk more cycling without another stabilizer. Hydroxyzine trial 10-20 mg instead of trazodone Or samples Belsomra given    04/08/22 appt noted: Current psychiatry relevant meds include Belsomra 20 mg nightly, clonazepam 0.5 mg tablets 3 nightly, lamotrigine 100 mg twice daily, trazodone 200 mg nightly, topiramate reduced to 50 mg  daily Was doing pretty well until Oct which is hard bc anniversary of brother's death.   She asked about increasing lamotrigine back up.   Did ok with sleep awhile at 2 clonazepam at night but then as fall approached then needed to go back to 3 of 0.5 mg HS to sleep.  Hard time falling asleep and turning off thoughts. No mood swings.   Working PT job.  Doing some shift work. Plan: Contingue clonazepam Taking 1.5 mg HS. It has some mood stabilizing effects and she has restarted the lamotrigine 100 mg twice daily.  She agrees to continue these 2 medications.  She agrees. Disc risk more cycling without another stabilizer. Continue Belsomra 20 mg HS Ok increase lamotrigine as noted: Increase lamotrigine to 1 and 1/2 tablets twice daily for 2 weeks then 2 tablets twice daily.  08/11/22 appt noted: Struggling.  Grief worsened depression.  Gained wt.  Isolating more.  Family babies her. Still working.  Not sure what to do with life.  Worse since beginning of theyear.  Lost a lot of family last year.  Regrets including staying married too long in a bad marriage.   Don't sleep well or eat right. Work 2nd shift.  Don't sleep enough and 6 hours.  Uses sleep hygiene.   Some counseling 2022 when lost her brother.  Has done Hospice counseling 3 times. More down than anxious but is taking clonazepam 1.5 mg Hs.    Past Psychiatric Medication Trials: Vraylar early weight gain.  Perphenazine 8 mg, quetiapine 300 mg, Abilify 10,  Geodon 40 BID,  Risperidone, latuda 5# weight gain Caplyta cost problems Lithium, Depakote, CBZ 300 sleepiness. Lamotrigine 300  Mirtazapine wt gain  Trazodone 200 NR, hydroxyzine NR, Belsomra 20 paroxetine, Lexapro, sertraline, duloxetine 120,  Psychiatric hospitalizations summer 2021  Review of Systems:  Review of Systems  Constitutional:  Positive for unexpected weight change.  Cardiovascular:  Negative for palpitations.  Neurological:  Positive for headaches. Negative for  dizziness, tremors and light-headedness.  Psychiatric/Behavioral:  Positive for dysphoric mood and sleep disturbance. The patient is nervous/anxious.     Medications: I have reviewed the patient's current medications.  Current Outpatient Medications  Medication Sig Dispense Refill   albuterol (VENTOLIN HFA) 108 (90 Base) MCG/ACT inhaler      amLODipine (NORVASC) 10 MG tablet Take 1 tablet (10 mg total) by mouth daily. 30 tablet 0   Ascorbic Acid (VITAMIN C PO) Take 1 tablet  by mouth daily.     buPROPion (WELLBUTRIN XL) 150 MG 24 hr tablet 1 daily for 1 week then 2 each am 30 tablet 0   CALCIUM PO Take 1 tablet by mouth daily.     cholecalciferol (VITAMIN D) 1000 UNITS tablet Take 1,000 Units by mouth 2 (two) times daily.     Cyanocobalamin (VITAMIN B 12 PO) Take by mouth.     denosumab (PROLIA) 60 MG/ML SOLN injection Inject 60 mg into the skin every 6 (six) months. Administer in upper arm, thigh, or abdomen     fish oil-omega-3 fatty acids 1000 MG capsule Take 1 g by mouth daily.     fluticasone (FLONASE) 50 MCG/ACT nasal spray Place 2 sprays into the nose daily.     fluticasone furoate-vilanterol (BREO ELLIPTA) 100-25 MCG/INH AEPB Inhale 1 puff into the lungs daily.     Glycerin-Hypromellose-PEG 400 ([facility] DRY EYE RELIEF OP) Apply 1 drop to eye at bedtime as needed (dry eyes).     hydrochlorothiazide (HYDRODIURIL) 25 MG tablet Take 25 mg by mouth daily.     IRON PO Take 1 tablet by mouth daily.     lamoTRIgine (LAMICTAL) 100 MG tablet Take 2 tablets (200 mg total) by mouth 2 (two) times daily. 360 tablet 1   magnesium gluconate (MAGONATE) 500 MG tablet Take 500 mg by mouth daily.     Multiple Vitamins-Minerals (COMPLETE MULTIVITAMIN/MINERAL PO) Take 1 tablet by mouth daily.     pantoprazole (PROTONIX) 40 MG tablet Take 40 mg by mouth daily.     Suvorexant (BELSOMRA) 20 MG TABS Take 1 tablet by mouth at bedtime. 90 tablet 0   tiZANidine (ZANAFLEX) 2 MG tablet Take 2 mg by mouth every 8  (eight) hours as needed.     topiramate (TOPAMAX) 200 MG tablet Take 1 tablet (200 mg total) by mouth daily. (Patient taking differently: Take 50 mg by mouth daily.) 90 tablet 1   traZODone (DESYREL) 100 MG tablet TAKE 2 TABLETS BY MOUTH AT  BEDTIME AS NEEDED FOR SLEEP 180 tablet 3   vitamin E 1000 UNIT capsule Take 1,000 Units by mouth daily.     vitamin k 100 MCG tablet Take 100 mcg by mouth daily.     clonazePAM (KLONOPIN) 0.5 MG tablet Take 1 tablet (0.5 mg total) by mouth 3 (three) times daily as needed for anxiety (and insomnia). 270 tablet 0   No current facility-administered medications for this visit.    Medication Side Effects: None  Allergies:  Allergies  Allergen Reactions   Dilaudid [Hydromorphone Hcl] Other (See Comments)    Nightmares   Divalproex Sodium     Became comatose when she took Depakote and Geodon together.   Nsaids     "kidney problem"   Other Swelling    Nuts   Perphenazine-Amitriptyline Itching   Seroquel [Quetiapine Fumerate]     Past Medical History:  Diagnosis Date   Anxiety    Asthma    Bipolar disorder (HCC)    Cataracts, bilateral    Depression    GERD (gastroesophageal reflux disease)    Hydatid cyst    Left   Hypertension    Kidney problem    RT kidney is smaller -> decreased output   Migraine    Osteoporosis 11/2016   T score -2.8  improved from prior DEXA   Right rotator cuff tear    Vertigo     Family History  Problem Relation Age of Onset   Hypertension Mother  Heart disease Mother    Stroke Mother    Diabetes Father    Hypertension Father    Heart disease Father    Stroke Father    Cancer Father        Lymphoma   Prostate cancer Brother    Prostate cancer Brother    Heart disease Brother    Stroke Paternal Uncle     Social History   Socioeconomic History   Marital status: Divorced    Spouse name: Not on file   Number of children: 1   Years of education: MSN   Highest education level: Not on file   Occupational History   Not on file  Tobacco Use   Smoking status: Never   Smokeless tobacco: Never  Vaping Use   Vaping Use: Never used  Substance and Sexual Activity   Alcohol use: Yes    Alcohol/week: 0.0 standard drinks of alcohol    Comment: Rare   Drug use: No   Sexual activity: Not Currently    Birth control/protection: Post-menopausal    Comment: 1st intercourse 38 yo-2 partners  Other Topics Concern   Not on file  Social History Narrative   Lives with husband and daughter   Caffeine use: 2 cups per day   Right handed   Social Determinants of Health   Financial Resource Strain: Not on file  Food Insecurity: Not on file  Transportation Needs: Not on file  Physical Activity: Not on file  Stress: Not on file  Social Connections: Not on file  Intimate Partner Violence: Not on file    Past Medical History, Surgical history, Social history, and Family history were reviewed and updated as appropriate.   Please see review of systems for further details on the patient's review from today.   Objective:   Physical Exam:  There were no vitals taken for this visit.  Physical Exam Constitutional:      General: She is not in acute distress. Musculoskeletal:        General: No deformity.  Neurological:     Mental Status: She is alert and oriented to person, place, and time.     Coordination: Coordination normal.  Psychiatric:        Attention and Perception: Attention and perception normal. She does not perceive auditory or visual hallucinations.        Mood and Affect: Mood is depressed. Mood is not anxious. Affect is not labile, blunt, angry or inappropriate.        Speech: Speech normal.        Behavior: Behavior normal.        Thought Content: Thought content normal. Thought content is not paranoid or delusional. Thought content does not include homicidal or suicidal ideation. Thought content does not include suicidal plan.        Cognition and Memory: Cognition  and memory normal.        Judgment: Judgment normal.     Comments: Insight intact Not suicidal.   Neat and pleasant      Lab Review:     Component Value Date/Time   NA 137 01/02/2020 1122   K 3.8 01/02/2020 1122   CL 105 01/02/2020 1122   CO2 23 01/02/2020 1122   GLUCOSE 114 (H) 01/02/2020 1122   BUN 27 (H) 01/02/2020 1122   CREATININE 1.58 (H) 01/02/2020 1122   CALCIUM 9.3 01/02/2020 1122   PROT 8.4 (H) 01/02/2020 1122   ALBUMIN 5.0 01/02/2020 1122   AST 28 01/02/2020  1122   ALT 19 01/02/2020 1122   ALKPHOS 91 01/02/2020 1122   BILITOT 0.7 01/02/2020 1122   GFRNONAA 33 (L) 01/02/2020 1122   GFRAA 39 (L) 01/02/2020 1122       Component Value Date/Time   WBC 6.0 01/02/2020 1122   RBC 4.51 01/02/2020 1122   HGB 13.6 01/02/2020 1122   HCT 40.5 01/02/2020 1122   PLT 210 01/02/2020 1122   MCV 89.8 01/02/2020 1122   MCH 30.2 01/02/2020 1122   MCHC 33.6 01/02/2020 1122   RDW 11.9 01/02/2020 1122   LYMPHSABS 0.8 01/02/2020 1122   MONOABS 0.3 01/02/2020 1122   EOSABS 0.1 01/02/2020 1122   BASOSABS 0.0 01/02/2020 1122    No results found for: "POCLITH", "LITHIUM"   No results found for: "PHENYTOIN", "PHENOBARB", "VALPROATE", "CBMZ"   .res Assessment: Plan:    Shantell was seen today for follow-up, depression and anxiety.  Diagnoses and all orders for this visit:  Mixed bipolar II disorder with rapid cycling (HCC) -     buPROPion (WELLBUTRIN XL) 150 MG 24 hr tablet; 1 daily for 1 week then 2 each am -     clonazePAM (KLONOPIN) 0.5 MG tablet; Take 1 tablet (0.5 mg total) by mouth 3 (three) times daily as needed for anxiety (and insomnia).  Generalized anxiety disorder -     clonazePAM (KLONOPIN) 0.5 MG tablet; Take 1 tablet (0.5 mg total) by mouth 3 (three) times daily as needed for anxiety (and insomnia).  Insomnia due to mental condition -     clonazePAM (KLONOPIN) 0.5 MG tablet; Take 1 tablet (0.5 mg total) by mouth 3 (three) times daily as needed for anxiety  (and insomnia).  Migraine without aura and without status migrainosus, not intractable  Complicated grief    Greater than 50% of 30 min face to face time with patient was spent on counseling and coordination of care. We discussed Patient with a long history of bipolar disorder with rapid cycling that has been somewhat difficult to treat for a variety of reasons.  These include confounding migraine headaches which require a lot of medication and the fact that she is medication sensitive as well as having failed multiple medications.  In addition she refuses meds that can cause weight gain which greatly limit options.  2021 summer she was psych hospitalized which was originally attributed to an intentional overdose which the patient strongly denies.  There is no evidence that she intentionally overdosed or had any suicidal intent.  She is not currently suicidal and has not been her recent history.  She has been very overwhelmed with caring for her mother's estate by herself and stressed by grief.  She has started counseling which is helpful.  She has recently reduced a number of the anticonvulsants that were used to help her migraine headaches.   She is not currently confused.  She is not a acutely depressed but more stressed.  She is not manic.  She has no psychosis.  There is no evidence of substance abuse. HA manageable.  We discussed the short-term risks associated with benzodiazepines including sedation and increased fall risk among others.  Discussed long-term side effect risk including dependence, potential withdrawal symptoms, and the potential eventual dose-related risk of dementia.  But recent studies from 2020 dispute this association between benzodiazepines and dementia risk. Newer studies in 2020 do not support an association with dementia.  Disc this again with detail.  Disc try gradually reduce clonazepam.  Rec counseling for grief.  Contingue clonazepam Taking 1.5 mg HS. Disc risk  more cycling without another stabilizer. Continue Belsomra 20 mg HS Start Wellbutrin for depression and increase to 300 mg AM  lamotrigine to 1 and 1/2 tablets twice daily for 2 weeks then 2 tablets twice daily.  FU 2 mos  Lynder Parents MD, DFAPA  Please see After Visit Summary for patient specific instructions.  No future appointments.    No orders of the defined types were placed in this encounter.   -------------------------------

## 2022-08-24 ENCOUNTER — Other Ambulatory Visit: Payer: Self-pay | Admitting: Psychiatry

## 2022-08-24 DIAGNOSIS — F3181 Bipolar II disorder: Secondary | ICD-10-CM

## 2022-09-02 ENCOUNTER — Other Ambulatory Visit: Payer: Self-pay | Admitting: Psychiatry

## 2022-09-02 DIAGNOSIS — F3181 Bipolar II disorder: Secondary | ICD-10-CM

## 2022-09-08 DIAGNOSIS — M810 Age-related osteoporosis without current pathological fracture: Secondary | ICD-10-CM | POA: Diagnosis not present

## 2022-09-09 ENCOUNTER — Other Ambulatory Visit: Payer: Self-pay

## 2022-09-09 DIAGNOSIS — F3181 Bipolar II disorder: Secondary | ICD-10-CM

## 2022-09-09 DIAGNOSIS — F5105 Insomnia due to other mental disorder: Secondary | ICD-10-CM

## 2022-09-09 DIAGNOSIS — F411 Generalized anxiety disorder: Secondary | ICD-10-CM

## 2022-09-09 MED ORDER — CLONAZEPAM 0.5 MG PO TABS: 0.5000 mg | ORAL_TABLET | Freq: Three times a day (TID) | ORAL | 0 refills | Status: DC | PRN

## 2022-09-10 ENCOUNTER — Other Ambulatory Visit: Payer: Self-pay

## 2022-09-10 ENCOUNTER — Telehealth: Payer: Self-pay | Admitting: Psychiatry

## 2022-09-10 DIAGNOSIS — F5105 Insomnia due to other mental disorder: Secondary | ICD-10-CM

## 2022-09-10 MED ORDER — BELSOMRA 20 MG PO TABS: 1.0000 | ORAL_TABLET | Freq: Every day | ORAL | 0 refills | Status: DC

## 2022-09-10 NOTE — Telephone Encounter (Signed)
Pended.

## 2022-09-10 NOTE — Telephone Encounter (Signed)
Patient lm stating [facility] didn't receive a request for her Belsomra. She is almost out and would like a refill as soon as possible.  Appointment 10/13/22 Contact # [phone]

## 2022-10-12 ENCOUNTER — Other Ambulatory Visit: Payer: Self-pay | Admitting: Psychiatry

## 2022-10-12 DIAGNOSIS — F3181 Bipolar II disorder: Secondary | ICD-10-CM

## 2022-10-12 NOTE — Telephone Encounter (Signed)
Has appt. tomorrow

## 2022-10-13 ENCOUNTER — Ambulatory Visit: Payer: Medicare Other | Admitting: Psychiatry

## 2022-10-13 ENCOUNTER — Encounter: Payer: Self-pay | Admitting: Psychiatry

## 2022-10-13 DIAGNOSIS — F5105 Insomnia due to other mental disorder: Secondary | ICD-10-CM

## 2022-10-13 DIAGNOSIS — F3181 Bipolar II disorder: Secondary | ICD-10-CM | POA: Diagnosis not present

## 2022-10-13 DIAGNOSIS — F411 Generalized anxiety disorder: Secondary | ICD-10-CM | POA: Diagnosis not present

## 2022-10-13 DIAGNOSIS — G43009 Migraine without aura, not intractable, without status migrainosus: Secondary | ICD-10-CM

## 2022-10-13 NOTE — Telephone Encounter (Signed)
Stopped Wellbutrin today

## 2022-10-13 NOTE — Progress Notes (Signed)
Kelli Morales [id number] 08/08/1950 72 y.o.   Subjective:   Patient ID:  Kelli Morales is a 72 y.o. (DOB 1951/03/28) female.  Chief Complaint:  Chief Complaint  Patient presents with   Follow-up   Depression   Anxiety    Depression        Associated symptoms include headaches.  Past medical history includes anxiety.   Anxiety Symptoms include nervous/anxious behavior. Patient reports no dizziness or palpitations.     Kelli Morales presents to the office today for follow-up of bipolar disorder.  hospitalization July 2021 for alleged intentional overdose of medications.  Discharged 01/02/2020 on clonazepam 0.5 mg 3 times daily as needed, lamotrigine 400 mg twice daily, topiramate 200 mg nightly, trazodone 300 mg nightly, and ziprasidone 40 mg twice daily, and zonisamide 300 mg daily.  12/01/2019 appointment notable for patient having seen significant mood stability and reduction of manic symptoms and depression with an increase in lamotrigine and the addition of Geodon 20 mg twice daily.  No meds were changed.  01/18/20 appt with the following noted: Disc recent hospitalization.  Said she felt weak the day of hospitalization and grief and got distraught.  She denied over taking meds and got pulled over by police for driving erratically.   She denies intentional overdose but she was dehydrated. Upset over being at behavioral health hosp of 2 days. Lamotrigine reduced to 1 daily. Reduced Zonesimide gradually per MD. Reduced topiramate to 100 mg daily. Says she ate normally that day.   Says she's always been sensitive to Geodon but is doing OK with it right now.  No other explanation for what happened that day except she was distraught and overwhelmed with mother's estate. Started grief therapy at Hospice. Plan no med changes as recently hospitalized  02/10/2020 appt with the following noted: CO weight gain and thinks it is Geodon.  Gained weight gain on Seroquel and hasn't lost  it.  Wants to wean off clonazepam.  Has cut so many meds back dramatically. BP has come back to normal. I feel really good.  Calmer and more focused.  No brain fog.  No sadness.  Don't get as hyper.  Anxiety not out of control.  Better overall. No HA.  Meditation and therapy and it helped to wean off migraine meds.   Now more ready to let go of clonazepam.   Sleep variable.  Get wound up at work and can have a hard time sleeping but not too bad. Plan: Disc her desire to taper clonazepam.  Need to protect sleep.  Taking 1.5 mg HS. Reduce no faster than 0.25 mg reduction per month.   02/22/2020 phone call from patient complaining of 10 pound weight gain since January 04, 2020 when starting Geodon 40 mg twice daily. MD response:She has tried all of the mood stabilizers with the lowest weight gain risk.  She is not overweight.  But she is very focused on her weight.  The only alternative I see that she is not taking with low weight gain risk is haloperidol and typically ziprasidone or Geodon has even lower weight gain risk than haloperidol but there is individual variation. She had a psychiatric hospitalization on Geodon 20 mg twice daily so I would not be in favor of reducing the dose below Geodon 40 mg twice daily.  03/19/20 appt with following noted: Took herself off the Geodon bc jitters and weight gain. Restarted lamotrigine at 100 mg BID. Has gained from 128# to 155 # and  she attributes to meds including quetiapine and perphenazine. Some anxiety.  Still on clonazepam 1.5 mg daily.  Occ trazodone prn. Patient reports stable mood and denies depressed or irritable moods.   Patient denies difficulty with sleep initiation or maintenance. Denies appetite disturbance.  Patient reports that energy and motivation have been good.  Patient denies any difficulty with concentration.  Patient denies any suicidal ideation.  07/18/2020 appt with following noted: New job end of December.  Doing well with it. Agency  directoer and DON. Ongoing problem with weight gain 14#/6 mos.  CO worse.  Doesn't know why.  On [employer], walking.  Taking clonazepam 1.5 mg HS. "Probably not enough sleep", but sleeps 8-4.  Same sleep pattern for several years. No sig periods of hypomania except brief after mother's death. Patient reports stable mood and denies depressed or irritable moods.  Patient denies any recent difficulty with anxiety.  Patient denies difficulty with sleep initiation or maintenance. Denies appetite disturbance.  Patient reports that energy and motivation have been good.  Patient denies any difficulty with concentration.  Patient denies any suicidal ideation. Plan: Contingue clonazepam Taking 1.5 mg HS. It has some mood stabilizing effects and she has restarted the lamotrigine 100 mg twice daily.  She agrees to continue these 2 medications.  She agrees.  1/10/20023 appt noted: B and M passed away.  Called in May 06, 2023 crying and with insomnia. Rough time.  She had driven need to back track on knowing everything about what happened before brother died.  Did same thing after M died.  Couldn't let it go and obsessed on it. Tried Hospice without help. Poor sleep.  Initial with mind racing on deaths and what she needs to do. Taking klonopin 1.5 mg HS, trazodone 100 mg HS. Recognizes couldn't focus and memory issues after brother's death. Plan: Contingue clonazepam Taking 1.5 mg HS. It has some mood stabilizing effects and she has restarted the lamotrigine 100 mg twice daily.  She agrees to continue these 2 medications.  She agrees. Trial Caplyta 42 mg for bipolar depression and chronic insomnia.  06/21/2021 TC saying Caplyta was working.  07/04/2021 appt noted: The way my life is will be hard to not have some depression.  Not sadness.  Caplyta seems to work for sx so far and seems less down.  On it for 2 weeks.  Can't tell about weight gain yet. Exercising and diet healthier. Sleep is better with Caplyta.  Thinks it is  helping so far. Not sure the cost yet.   Uncle died and going to [place].   Sx of loneliness but has friends and family.  Feels lonely even in big group.  Still dealing with grief with M and B.   Plan: Contingue clonazepam Taking 1.5 mg HS. It has some mood stabilizing effects and she has restarted the lamotrigine 100 mg twice daily.  She agrees to continue these 2 medications.  She agrees. Continue Caplyta 42 mg for bipolar depression and chronic insomnia. Trazodone prn  08/14/2021 phone call saying she was no longer taking Caplyta because it was too expensive and also because she feared weight gain with it. 08/21/2021 phone call complaining of insomnia and wanting to increase trazodone.  Okay to increase trazodone to 200 mg nightly  11/27/2021 appointment with the following noted: Still dealing with HA.  Dr. Domingo Cocking, he increased to 200 mg daily. Lost 27# since 2023/05/06. B died in 04-05-23 and started her on spiral down.  Other B dx prostate CA.  Worried  over him.  I was such a wreck.  Nephew died of drugs at 27. Just back from  [place] a week ago. Trouble sleeping since October; initial and terminal.  To bed 8 and up 430.  Falling asleep 10 and awake at 3.   Down to clonazepam 0.5 mg 2 at night.  Anxiety better with exercise.  Has dumped a couple of BF and niece said she's needing therapy.  Not sure why. Better with grief now than she was.  Not depressed.  Minimal manic sx Plan: Contingue clonazepam Taking 1.5 mg HS. It has some mood stabilizing effects and she has restarted the lamotrigine 100 mg twice daily.  She agrees to continue these 2 medications.  She agrees. Disc risk more cycling without another stabilizer. Hydroxyzine trial 10-20 mg instead of trazodone Or samples Belsomra given    04/08/22 appt noted: Current psychiatry relevant meds include Belsomra 20 mg nightly, clonazepam 0.5 mg tablets 3 nightly, lamotrigine 100 mg twice daily, trazodone 200 mg nightly, topiramate reduced to 50 mg  daily Was doing pretty well until Oct which is hard bc anniversary of brother's death.   She asked about increasing lamotrigine back up.   Did ok with sleep awhile at 2 clonazepam at night but then as fall approached then needed to go back to 3 of 0.5 mg HS to sleep.  Hard time falling asleep and turning off thoughts. No mood swings.   Working PT job.  Doing some shift work. Plan: Contingue clonazepam Taking 1.5 mg HS. It has some mood stabilizing effects and she has restarted the lamotrigine 100 mg twice daily.  She agrees to continue these 2 medications.  She agrees. Disc risk more cycling without another stabilizer. Continue Belsomra 20 mg HS Ok increase lamotrigine as noted: Increase lamotrigine to 1 and 1/2 tablets twice daily for 2 weeks then 2 tablets twice daily.  08/11/22 appt noted: Struggling.  Grief worsened depression.  Gained wt.  Isolating more.  Family babies her. Still working.  Not sure what to do with life.  Worse since beginning of theyear.  Lost a lot of family last year.  Regrets including staying married too long in a bad marriage.   Don't sleep well or eat right. Work 2nd shift.  Don't sleep enough and 6 hours.  Uses sleep hygiene.   Some counseling 2022 when lost her brother.  Has done Hospice counseling 3 times. More down than anxious but is taking clonazepam 1.5 mg Hs. Plan: Contingue clonazepam Taking 1.5 mg HS. Disc risk more cycling without another stabilizer. Continue Belsomra 20 mg HS Start Wellbutrin for depression and increase to 300 mg AM  lamotrigine to 1 and 1/2 tablets twice daily for 2 weeks then 2 tablets twice daily.  10/13/22 appt noted: Dep with feelings worthlessness.  Still tries to socialize and get out with friends.  They recognize her dep. Wellb slowly increased BP.  Dr. Cliffton Asters increased BP meds. Researching on internet.   Asks about stopping Wellbutrin and using Caplyta again.  She thinks it helped.   She's gained 8# since here.     Past  Psychiatric Medication Trials: Vraylar early weight gain.  Perphenazine 8 mg, quetiapine 300 mg, Abilify 10,  Geodon 40 BID,  Risperidone, latuda 5# weight gain Caplyta cost problems Lithium, Depakote, CBZ 300 sleepiness. Lamotrigine 300  Mirtazapine wt gain  Trazodone 200 NR, hydroxyzine NR, Belsomra 20 paroxetine, Lexapro, sertraline, duloxetine 120,  Psychiatric hospitalizations summer 2021  Review of Systems:  Review  of Systems  Constitutional:  Positive for unexpected weight change.  Cardiovascular:  Negative for palpitations.  Neurological:  Positive for headaches. Negative for dizziness, tremors and light-headedness.  Psychiatric/Behavioral:  Positive for dysphoric mood and sleep disturbance. Negative for agitation. The patient is nervous/anxious.     Medications: I have reviewed the patient's current medications.  Current Outpatient Medications  Medication Sig Dispense Refill   albuterol (VENTOLIN HFA) 108 (90 Base) MCG/ACT inhaler      amLODipine (NORVASC) 10 MG tablet Take 1 tablet (10 mg total) by mouth daily. 30 tablet 0   Ascorbic Acid (VITAMIN C PO) Take 1 tablet by mouth daily.     buPROPion (WELLBUTRIN XL) 300 MG 24 hr tablet Take 1 tablet (300 mg total) by mouth daily. 90 tablet 0   CALCIUM PO Take 1 tablet by mouth daily.     cholecalciferol (VITAMIN D) 1000 UNITS tablet Take 1,000 Units by mouth 2 (two) times daily.     clonazePAM (KLONOPIN) 0.5 MG tablet Take 1 tablet (0.5 mg total) by mouth 3 (three) times daily as needed for anxiety (and insomnia). 270 tablet 0   Cyanocobalamin (VITAMIN B 12 PO) Take by mouth.     denosumab (PROLIA) 60 MG/ML SOLN injection Inject 60 mg into the skin every 6 (six) months. Administer in upper arm, thigh, or abdomen     fish oil-omega-3 fatty acids 1000 MG capsule Take 1 g by mouth daily.     fluticasone (FLONASE) 50 MCG/ACT nasal spray Place 2 sprays into the nose daily.     fluticasone furoate-vilanterol (BREO ELLIPTA) 100-25  MCG/INH AEPB Inhale 1 puff into the lungs daily.     Glycerin-Hypromellose-PEG 400 ([facility] DRY EYE RELIEF OP) Apply 1 drop to eye at bedtime as needed (dry eyes).     hydrochlorothiazide (HYDRODIURIL) 25 MG tablet Take 25 mg by mouth daily.     IRON PO Take 1 tablet by mouth daily.     lamoTRIgine (LAMICTAL) 100 MG tablet TAKE 2 TABLETS BY MOUTH TWICE  DAILY 180 tablet 1   magnesium gluconate (MAGONATE) 500 MG tablet Take 500 mg by mouth daily.     Multiple Vitamins-Minerals (COMPLETE MULTIVITAMIN/MINERAL PO) Take 1 tablet by mouth daily.     pantoprazole (PROTONIX) 40 MG tablet Take 40 mg by mouth daily.     Suvorexant (BELSOMRA) 20 MG TABS Take 1 tablet (20 mg total) by mouth at bedtime. 90 tablet 0   tiZANidine (ZANAFLEX) 2 MG tablet Take 2 mg by mouth every 8 (eight) hours as needed.     topiramate (TOPAMAX) 200 MG tablet Take 1 tablet (200 mg total) by mouth daily. (Patient taking differently: Take 100 mg by mouth daily.) 90 tablet 1   traZODone (DESYREL) 100 MG tablet TAKE 2 TABLETS BY MOUTH AT  BEDTIME AS NEEDED FOR SLEEP 180 tablet 3   vitamin E 1000 UNIT capsule Take 1,000 Units by mouth daily.     vitamin k 100 MCG tablet Take 100 mcg by mouth daily.     buPROPion (WELLBUTRIN XL) 150 MG 24 hr tablet 1 daily for 1 week then 2 each am (Patient not taking: Reported on 10/13/2022) 30 tablet 0   No current facility-administered medications for this visit.    Medication Side Effects: None  Allergies:  Allergies  Allergen Reactions   Dilaudid [Hydromorphone Hcl] Other (See Comments)    Nightmares   Divalproex Sodium     Became comatose when she took Depakote and Geodon together.  Nsaids     "kidney problem"   Other Swelling    Nuts   Perphenazine-Amitriptyline Itching   Seroquel [Quetiapine Fumerate]     Past Medical History:  Diagnosis Date   Anxiety    Asthma    Bipolar disorder (HCC)    Cataracts, bilateral    Depression    GERD (gastroesophageal reflux disease)     Hydatid cyst    Left   Hypertension    Kidney problem    RT kidney is smaller -> decreased output   Migraine    Osteoporosis 11/2016   T score -2.8  improved from prior DEXA   Right rotator cuff tear    Vertigo     Family History  Problem Relation Age of Onset   Hypertension Mother    Heart disease Mother    Stroke Mother    Diabetes Father    Hypertension Father    Heart disease Father    Stroke Father    Cancer Father        Lymphoma   Prostate cancer Brother    Prostate cancer Brother    Heart disease Brother    Stroke Paternal Uncle     Social History   Socioeconomic History   Marital status: Divorced    Spouse name: Not on file   Number of children: 1   Years of education: MSN   Highest education level: Not on file  Occupational History   Not on file  Tobacco Use   Smoking status: Never   Smokeless tobacco: Never  Vaping Use   Vaping Use: Never used  Substance and Sexual Activity   Alcohol use: Yes    Alcohol/week: 0.0 standard drinks of alcohol    Comment: Rare   Drug use: No   Sexual activity: Not Currently    Birth control/protection: Post-menopausal    Comment: 1st intercourse 51 yo-2 partners  Other Topics Concern   Not on file  Social History Narrative   Lives with husband and daughter   Caffeine use: 2 cups per day   Right handed   Social Determinants of Health   Financial Resource Strain: Not on file  Food Insecurity: Not on file  Transportation Needs: Not on file  Physical Activity: Not on file  Stress: Not on file  Social Connections: Not on file  Intimate Partner Violence: Not on file    Past Medical History, Surgical history, Social history, and Family history were reviewed and updated as appropriate.   Please see review of systems for further details on the patient's review from today.   Objective:   Physical Exam:  There were no vitals taken for this visit.  Physical Exam Constitutional:      General: She is not in  acute distress. Musculoskeletal:        General: No deformity.  Neurological:     Mental Status: She is alert and oriented to person, place, and time.     Coordination: Coordination normal.  Psychiatric:        Attention and Perception: Attention and perception normal. She does not perceive auditory or visual hallucinations.        Mood and Affect: Mood is depressed. Mood is not anxious. Affect is not labile, blunt, angry or inappropriate.        Speech: Speech normal.        Behavior: Behavior normal. Behavior is not slowed.        Thought Content: Thought content normal. Thought content is  not paranoid or delusional. Thought content does not include homicidal or suicidal ideation. Thought content does not include suicidal plan.        Cognition and Memory: Cognition and memory normal.        Judgment: Judgment normal.     Comments: Insight intact Not suicidal.   Neat and pleasant      Lab Review:     Component Value Date/Time   NA 137 01/02/2020 1122   K 3.8 01/02/2020 1122   CL 105 01/02/2020 1122   CO2 23 01/02/2020 1122   GLUCOSE 114 (H) 01/02/2020 1122   BUN 27 (H) 01/02/2020 1122   CREATININE 1.58 (H) 01/02/2020 1122   CALCIUM 9.3 01/02/2020 1122   PROT 8.4 (H) 01/02/2020 1122   ALBUMIN 5.0 01/02/2020 1122   AST 28 01/02/2020 1122   ALT 19 01/02/2020 1122   ALKPHOS 91 01/02/2020 1122   BILITOT 0.7 01/02/2020 1122   GFRNONAA 33 (L) 01/02/2020 1122   GFRAA 39 (L) 01/02/2020 1122       Component Value Date/Time   WBC 6.0 01/02/2020 1122   RBC 4.51 01/02/2020 1122   HGB 13.6 01/02/2020 1122   HCT 40.5 01/02/2020 1122   PLT 210 01/02/2020 1122   MCV 89.8 01/02/2020 1122   MCH 30.2 01/02/2020 1122   MCHC 33.6 01/02/2020 1122   RDW 11.9 01/02/2020 1122   LYMPHSABS 0.8 01/02/2020 1122   MONOABS 0.3 01/02/2020 1122   EOSABS 0.1 01/02/2020 1122   BASOSABS 0.0 01/02/2020 1122    No results found for: "POCLITH", "LITHIUM"   No results found for: "PHENYTOIN",  "PHENOBARB", "VALPROATE", "CBMZ"   .res Assessment: Plan:    Anifer was seen today for follow-up, depression and anxiety.  Diagnoses and all orders for this visit:  Mixed bipolar II disorder with rapid cycling (HCC)  Generalized anxiety disorder  Insomnia due to mental condition  Migraine without aura and without status migrainosus, not intractable    Greater than 50% of 30 min face to face time with patient was spent on counseling and coordination of care. We discussed Patient with a long history of bipolar disorder with rapid cycling that has been somewhat difficult to treat for a variety of reasons.  These include confounding migraine headaches which require a lot of medication and the fact that she is medication sensitive as well as having failed multiple medications.  In addition she refuses meds that can cause weight gain which greatly limit options.  2021 summer she was psych hospitalized which was originally attributed to an intentional overdose which the patient strongly denies.  There is no evidence that she intentionally overdosed or had any suicidal intent.  She is not currently suicidal and has not been her recent history.  She has been very overwhelmed with caring for her mother's estate by herself and stressed by grief.  She has started counseling which is helpful.  She has recently reduced a number of the anticonvulsants that were used to help her migraine headaches.   She is not currently confused.  She is not a acutely depressed but more stressed.  She is not manic.  She has no psychosis.  There is no evidence of substance abuse. HA manageable.  We discussed the short-term risks associated with benzodiazepines including sedation and increased fall risk among others.  Discussed long-term side effect risk including dependence, potential withdrawal symptoms, and the potential eventual dose-related risk of dementia.  But recent studies from 2020 dispute this association between  benzodiazepines and dementia risk. Newer studies in 2020 do not support an association with dementia.  Disc this again with detail.  Disc try gradually reduce clonazepam.  Rec counseling for grief.  Contingue clonazepam Taking 1.5 mg HS. Disc risk more cycling without another stabilizer. She agrees to restart Caplyta 42 mg daily.  Continue Belsomra 20 mg HS Stop Wellbutrin continue lamotrigine 100 mg 2 tablets twice daily.  FU 2 mos  Meredith Staggers MD, DFAPA  Please see After Visit Summary for patient specific instructions.  No future appointments.     No orders of the defined types were placed in this encounter.   -------------------------------

## 2022-10-19 ENCOUNTER — Other Ambulatory Visit: Payer: Self-pay | Admitting: Psychiatry

## 2022-10-19 DIAGNOSIS — F3181 Bipolar II disorder: Secondary | ICD-10-CM

## 2022-10-20 ENCOUNTER — Telehealth: Payer: Self-pay | Admitting: Psychiatry

## 2022-10-20 NOTE — Telephone Encounter (Signed)
Pt called reporting started Caplyta 42 mg 1/d. Working well. Requesting Rx to [facility] mail order. Apt 7/29

## 2022-10-21 ENCOUNTER — Other Ambulatory Visit: Payer: Self-pay

## 2022-10-21 MED ORDER — LUMATEPERONE TOSYLATE 42 MG PO CAPS: 42.0000 mg | ORAL_CAPSULE | Freq: Every day | ORAL | 0 refills | Status: DC

## 2022-10-21 NOTE — Telephone Encounter (Signed)
Rx for Caplyta submitted to [facility]

## 2022-10-22 ENCOUNTER — Telehealth: Payer: Self-pay | Admitting: Psychiatry

## 2022-10-22 NOTE — Telephone Encounter (Signed)
[facility] sent PA request for Caplyta 42mg  , See CMM

## 2022-10-24 NOTE — Telephone Encounter (Signed)
Pending with [facility]

## 2022-11-04 ENCOUNTER — Telehealth: Payer: Self-pay

## 2022-11-04 DIAGNOSIS — G43719 Chronic migraine without aura, intractable, without status migrainosus: Secondary | ICD-10-CM | POA: Diagnosis not present

## 2022-11-04 DIAGNOSIS — N1832 Chronic kidney disease, stage 3b: Secondary | ICD-10-CM | POA: Diagnosis not present

## 2022-11-04 DIAGNOSIS — E876 Hypokalemia: Secondary | ICD-10-CM | POA: Diagnosis not present

## 2022-11-04 DIAGNOSIS — N261 Atrophy of kidney (terminal): Secondary | ICD-10-CM | POA: Diagnosis not present

## 2022-11-04 DIAGNOSIS — I10 Essential (primary) hypertension: Secondary | ICD-10-CM | POA: Diagnosis not present

## 2022-11-04 NOTE — Telephone Encounter (Signed)
Prior Approval received for CAPLYTA 42 MG effective through 06/09/2023 with [facility], PA# [id number]

## 2022-11-04 NOTE — Telephone Encounter (Signed)
Approval received through 06/09/2023 with [facility] for Caplyta 42 mg

## 2022-11-12 ENCOUNTER — Other Ambulatory Visit: Payer: Self-pay | Admitting: Psychiatry

## 2022-11-12 DIAGNOSIS — F5105 Insomnia due to other mental disorder: Secondary | ICD-10-CM

## 2022-11-12 DIAGNOSIS — F411 Generalized anxiety disorder: Secondary | ICD-10-CM

## 2022-11-12 DIAGNOSIS — F3181 Bipolar II disorder: Secondary | ICD-10-CM

## 2022-11-14 ENCOUNTER — Other Ambulatory Visit: Payer: Self-pay | Admitting: Psychiatry

## 2022-12-02 ENCOUNTER — Telehealth: Payer: Self-pay | Admitting: Psychiatry

## 2022-12-03 ENCOUNTER — Other Ambulatory Visit: Payer: Self-pay

## 2022-12-03 ENCOUNTER — Telehealth: Payer: Self-pay | Admitting: Psychiatry

## 2022-12-03 DIAGNOSIS — F5105 Insomnia due to other mental disorder: Secondary | ICD-10-CM

## 2022-12-03 MED ORDER — BELSOMRA 20 MG PO TABS: 1.0000 | ORAL_TABLET | Freq: Every day | ORAL | 0 refills | Status: DC

## 2022-12-03 NOTE — Telephone Encounter (Signed)
Kelli Morales's cost of Caplyta is $638/month.  Kelli Morales applied for Pt. Assistance but has been denied because Kelli Morales has insurance.  I have talked with the program, and they indicated to me that anyone with insurance not matter the cost of the medication will not qualify for assistance.  I asked if Kelli Morales was at the poverty level would they reconsider, and I was told no that would not matter.  Kelli Morales has samples now for 3 weeks.  Her next appt is 7/29. I have not told Kelli Morales yet that Kelli Morales did not qualify.

## 2022-12-03 NOTE — Telephone Encounter (Signed)
Thanks .  Give her enough samples until appt.  We can talk with rep and see if she can help.  I have some stashed away, I can give her if needed to.

## 2022-12-04 NOTE — Telephone Encounter (Signed)
Pulled samples and notified patient.  

## 2022-12-05 NOTE — Telephone Encounter (Signed)
Needs PA for Belsoma? Nothing in CMM.

## 2022-12-06 NOTE — Telephone Encounter (Signed)
Submitted a prior authorization for Belsomra 20 mg per request of pt but response back from [facility] is medication is on formulary and no PA needed.

## 2022-12-26 DIAGNOSIS — H524 Presbyopia: Secondary | ICD-10-CM | POA: Diagnosis not present

## 2022-12-26 DIAGNOSIS — H52203 Unspecified astigmatism, bilateral: Secondary | ICD-10-CM | POA: Diagnosis not present

## 2022-12-26 DIAGNOSIS — H04123 Dry eye syndrome of bilateral lacrimal glands: Secondary | ICD-10-CM | POA: Diagnosis not present

## 2022-12-26 DIAGNOSIS — H26493 Other secondary cataract, bilateral: Secondary | ICD-10-CM | POA: Diagnosis not present

## 2023-01-05 ENCOUNTER — Encounter: Payer: Self-pay | Admitting: Psychiatry

## 2023-01-05 ENCOUNTER — Ambulatory Visit (INDEPENDENT_AMBULATORY_CARE_PROVIDER_SITE_OTHER): Payer: Medicare HMO | Admitting: Psychiatry

## 2023-01-05 DIAGNOSIS — F3181 Bipolar II disorder: Secondary | ICD-10-CM | POA: Diagnosis not present

## 2023-01-05 DIAGNOSIS — F5105 Insomnia due to other mental disorder: Secondary | ICD-10-CM | POA: Diagnosis not present

## 2023-01-05 DIAGNOSIS — F411 Generalized anxiety disorder: Secondary | ICD-10-CM | POA: Diagnosis not present

## 2023-01-05 DIAGNOSIS — G43009 Migraine without aura, not intractable, without status migrainosus: Secondary | ICD-10-CM

## 2023-01-05 MED ORDER — CHLORPROMAZINE HCL 25 MG PO TABS: ORAL_TABLET | ORAL | 0 refills | Status: DC

## 2023-01-05 NOTE — Progress Notes (Signed)
[name] [id number] [birth year] 72 y.o.   Subjective:   Patient ID:  Kelli Morales is a 72 y.o. (DOB 05-Jan-1951) female.  Chief Complaint:  Chief Complaint  Patient presents with   Follow-up   Depression   Sleeping Problem   Anxiety    Depression        Associated symptoms include headaches.  Past medical history includes anxiety.   Anxiety Symptoms include nervous/anxious behavior. Patient reports no dizziness or palpitations.     Kelli Morales presents to the office today for follow-up of bipolar disorder.  hospitalization July 2021 for alleged intentional overdose of medications.  Discharged 01/02/2020 on clonazepam 0.5 mg 3 times daily as needed, lamotrigine 400 mg twice daily, topiramate 200 mg nightly, trazodone 300 mg nightly, and ziprasidone 40 mg twice daily, and zonisamide 300 mg daily.  12/01/2019 appointment notable for patient having seen significant mood stability and reduction of manic symptoms and depression with an increase in lamotrigine and the addition of Geodon 20 mg twice daily.  No meds were changed.  01/18/20 appt with the following noted: Disc recent hospitalization.  Said she felt weak the day of hospitalization and grief and got distraught.  She denied over taking meds and got pulled over by police for driving erratically.   She denies intentional overdose but she was dehydrated. Upset over being at behavioral health hosp of 2 days. Lamotrigine reduced to 1 daily. Reduced Zonesimide gradually per MD. Reduced topiramate to 100 mg daily. Says she ate normally that day.   Says she's always been sensitive to Geodon but is doing OK with it right now.  No other explanation for what happened that day except she was distraught and overwhelmed with mother's estate. Started grief therapy at Hospice. Plan no med changes as recently hospitalized  02/10/2020 appt with the following noted: CO weight gain and thinks it is Geodon.  Gained weight gain on  Seroquel and hasn't lost it.  Wants to wean off clonazepam.  Has cut so many meds back dramatically. BP has come back to normal. I feel really good.  Calmer and more focused.  No brain fog.  No sadness.  Don't get as hyper.  Anxiety not out of control.  Better overall. No HA.  Meditation and therapy and it helped to wean off migraine meds.   Now more ready to let go of clonazepam.   Sleep variable.  Get wound up at work and can have a hard time sleeping but not too bad. Plan: Disc her desire to taper clonazepam.  Need to protect sleep.  Taking 1.5 mg HS. Reduce no faster than 0.25 mg reduction per month.   02/22/2020 phone call from patient complaining of 10 pound weight gain since January 04, 2020 when starting Geodon 40 mg twice daily. MD response:She has tried all of the mood stabilizers with the lowest weight gain risk.  She is not overweight.  But she is very focused on her weight.  The only alternative I see that she is not taking with low weight gain risk is haloperidol and typically ziprasidone or Geodon has even lower weight gain risk than haloperidol but there is individual variation. She had a psychiatric hospitalization on Geodon 20 mg twice daily so I would not be in favor of reducing the dose below Geodon 40 mg twice daily.  03/19/20 appt with following noted: Took herself off the Geodon bc jitters and weight gain. Restarted lamotrigine at 100 mg BID. Has gained from 128#  to 155 # and she attributes to meds including quetiapine and perphenazine. Some anxiety.  Still on clonazepam 1.5 mg daily.  Occ trazodone prn. Patient reports stable mood and denies depressed or irritable moods.   Patient denies difficulty with sleep initiation or maintenance. Denies appetite disturbance.  Patient reports that energy and motivation have been good.  Patient denies any difficulty with concentration.  Patient denies any suicidal ideation.  07/18/2020 appt with following noted: New job end of December.   Doing well with it. Agency directoer and DON. Ongoing problem with weight gain 14#/6 mos.  CO worse.  Doesn't know why.  On [employer], walking.  Taking clonazepam 1.5 mg HS. "Probably not enough sleep", but sleeps 8-4.  Same sleep pattern for several years. No sig periods of hypomania except brief after mother's death. Patient reports stable mood and denies depressed or irritable moods.  Patient denies any recent difficulty with anxiety.  Patient denies difficulty with sleep initiation or maintenance. Denies appetite disturbance.  Patient reports that energy and motivation have been good.  Patient denies any difficulty with concentration.  Patient denies any suicidal ideation. Plan: Contingue clonazepam Taking 1.5 mg HS. It has some mood stabilizing effects and she has restarted the lamotrigine 100 mg twice daily.  She agrees to continue these 2 medications.  She agrees.  1/10/20023 appt noted: B and M passed away.  Called in 2023/05/11 crying and with insomnia. Rough time.  She had driven need to back track on knowing everything about what happened before brother died.  Did same thing after M died.  Couldn't let it go and obsessed on it. Tried Hospice without help. Poor sleep.  Initial with mind racing on deaths and what she needs to do. Taking klonopin 1.5 mg HS, trazodone 100 mg HS. Recognizes couldn't focus and memory issues after brother's death. Plan: Contingue clonazepam Taking 1.5 mg HS. It has some mood stabilizing effects and she has restarted the lamotrigine 100 mg twice daily.  She agrees to continue these 2 medications.  She agrees. Trial Caplyta 42 mg for bipolar depression and chronic insomnia.  06/21/2021 TC saying Caplyta was working.  07/04/2021 appt noted: The way my life is will be hard to not have some depression.  Not sadness.  Caplyta seems to work for sx so far and seems less down.  On it for 2 weeks.  Can't tell about weight gain yet. Exercising and diet healthier. Sleep is better  with Caplyta.  Thinks it is helping so far. Not sure the cost yet.   Uncle died and going to [place].   Sx of loneliness but has friends and family.  Feels lonely even in big group.  Still dealing with grief with M and B.   Plan: Contingue clonazepam Taking 1.5 mg HS. It has some mood stabilizing effects and she has restarted the lamotrigine 100 mg twice daily.  She agrees to continue these 2 medications.  She agrees. Continue Caplyta 42 mg for bipolar depression and chronic insomnia. Trazodone prn  08/14/2021 phone call saying she was no longer taking Caplyta because it was too expensive and also because she feared weight gain with it. 08/21/2021 phone call complaining of insomnia and wanting to increase trazodone.  Okay to increase trazodone to 200 mg nightly  11/27/2021 appointment with the following noted: Still dealing with HA.  Dr. Neale Burly, he increased to 200 mg daily. Lost 27# since 05-11-23. B died in 04-10-23 and started her on spiral down.  Other B dx  prostate CA.  Worried over him.  I was such a wreck.  Nephew died of drugs at 57. Just back from  [place] a week ago. Trouble sleeping since October; initial and terminal.  To bed 8 and up 430.  Falling asleep 10 and awake at 3.   Down to clonazepam 0.5 mg 2 at night.  Anxiety better with exercise.  Has dumped a couple of BF and niece said she's needing therapy.  Not sure why. Better with grief now than she was.  Not depressed.  Minimal manic sx Plan: Contingue clonazepam Taking 1.5 mg HS. It has some mood stabilizing effects and she has restarted the lamotrigine 100 mg twice daily.  She agrees to continue these 2 medications.  She agrees. Disc risk more cycling without another stabilizer. Hydroxyzine trial 10-20 mg instead of trazodone Or samples Belsomra given    04/08/22 appt noted: Current psychiatry relevant meds include Belsomra 20 mg nightly, clonazepam 0.5 mg tablets 3 nightly, lamotrigine 100 mg twice daily, trazodone 200 mg nightly,  topiramate reduced to 50 mg daily Was doing pretty well until Oct which is hard bc anniversary of brother's death.   She asked about increasing lamotrigine back up.   Did ok with sleep awhile at 2 clonazepam at night but then as fall approached then needed to go back to 3 of 0.5 mg HS to sleep.  Hard time falling asleep and turning off thoughts. No mood swings.   Working PT job.  Doing some shift work. Plan: Contingue clonazepam Taking 1.5 mg HS. It has some mood stabilizing effects and she has restarted the lamotrigine 100 mg twice daily.  She agrees to continue these 2 medications.  She agrees. Disc risk more cycling without another stabilizer. Continue Belsomra 20 mg HS Ok increase lamotrigine as noted: Increase lamotrigine to 1 and 1/2 tablets twice daily for 2 weeks then 2 tablets twice daily.  08/11/22 appt noted: Struggling.  Grief worsened depression.  Gained wt.  Isolating more.  Family babies her. Still working.  Not sure what to do with life.  Worse since beginning of theyear.  Lost a lot of family last year.  Regrets including staying married too long in a bad marriage.   Don't sleep well or eat right. Work 2nd shift.  Don't sleep enough and 6 hours.  Uses sleep hygiene.   Some counseling 2022 when lost her brother.  Has done Hospice counseling 3 times. More down than anxious but is taking clonazepam 1.5 mg Hs. Plan: Contingue clonazepam Taking 1.5 mg HS. Disc risk more cycling without another stabilizer. Continue Belsomra 20 mg HS Start Wellbutrin for depression and increase to 300 mg AM  lamotrigine to 1 and 1/2 tablets twice daily for 2 weeks then 2 tablets twice daily.  10/13/22 appt noted: Dep with feelings worthlessness.  Still tries to socialize and get out with friends.  They recognize her dep. Wellb slowly increased BP.  Dr. Cliffton Asters increased BP meds. Researching on internet.   Asks about stopping Wellbutrin and using Caplyta again.  She thinks it helped.   She's gained  8# since here.   Plan: Contingue clonazepam Taking 1.5 mg HS. Disc risk more cycling without another stabilizer. She agrees to restart Caplyta 42 mg daily. Continue Belsomra 20 mg HS Stop Wellbutrin continue lamotrigine 100 mg 2 tablets twice daily.  12/03/22 TC from pt with staff:  Eura's cost of Caplyta is $638/month. She applied for Pt. Assistance but has been denied because she has  insurance. I have talked with the program, and they indicated to me that anyone with insurance not matter the cost of the medication will not qualify for assistance. I asked if she was at the poverty level would they reconsider, and I was told no that would not matter.  MD resp: Thanks . Give her enough samples until appt. We can talk with rep and see if she can help. I have some stashed away, I can give her if needed to.   01/05/23 appt noted: "I'm not going to take Caplyta anymore".  Worries over the cost.  Can't handle that.   Hard to judge the effect on mood.  Not getting enough sleep.  Had to stop Belsomra since early June.   Caplyta doesn't seem to help sleep.  Mostly trouble staying asleep.  Clonazepam 1.5 mg HS doesnot help her stay asleep.   Avg 4-5 hours sleep.   Anxious about her money.  Semi-retired.   She thinks she made impulsive decision about stopping the Wellbutrin. Visited B and he noticed she is obsessive about wt and has tendency to make impulsive decisions.  Recognizes she is obsessive about wt concerns and it's been there a long tim.  Working on improving self. She senses more dep with sadness, crying, isolation.  Ended 2 rel with men without good reason.  Still goes to church.   Meds: clonazepam 1.5 mg HS and sometimes prn anxiety, no Wellbutrin, Caplyta 42 mg HS, lamotrigine 42 mg HS, no Belsomra DT cost, trazodone 200 mg HS,   Past Psychiatric Medication Trials: Vraylar early weight gain.  Perphenazine 8 mg SE itching and wt, quetiapine 300 mg, Abilify 10,  Geodon 40 BID,  Risperidone,  latuda 5# weight gain Caplyta cost problems Lithium, Depakote, CBZ 300 sleepiness. Lamotrigine 300  Mirtazapine wt gain  Trazodone 200 NR, hydroxyzine NR, Belsomra 20 Clonazepam 1.5 mg HS paroxetine, Lexapro, sertraline, duloxetine 120,  Psychiatric hospitalizations summer 2021  Review of Systems:  Review of Systems  Constitutional:  Positive for unexpected weight change.  Cardiovascular:  Negative for palpitations.  Neurological:  Positive for headaches. Negative for dizziness, tremors and light-headedness.  Psychiatric/Behavioral:  Positive for dysphoric mood and sleep disturbance. Negative for agitation. The patient is nervous/anxious.     Medications: I have reviewed the patient's current medications.  Current Outpatient Medications  Medication Sig Dispense Refill   albuterol (VENTOLIN HFA) 108 (90 Base) MCG/ACT inhaler      amLODipine (NORVASC) 10 MG tablet Take 1 tablet (10 mg total) by mouth daily. 30 tablet 0   Ascorbic Acid (VITAMIN C PO) Take 1 tablet by mouth daily.     CALCIUM PO Take 1 tablet by mouth daily.     cholecalciferol (VITAMIN D) 1000 UNITS tablet Take 1,000 Units by mouth 2 (two) times daily.     clonazePAM (KLONOPIN) 0.5 MG tablet TAKE 1 TABLET (0.5 MG TOTAL) BY MOUTH 3 (THREE) TIMES DAILY AS NEEDED FOR ANXIETY (AND INSOMNIA). 270 tablet 0   Cyanocobalamin (VITAMIN B 12 PO) Take by mouth.     denosumab (PROLIA) 60 MG/ML SOLN injection Inject 60 mg into the skin every 6 (six) months. Administer in upper arm, thigh, or abdomen     fish oil-omega-3 fatty acids 1000 MG capsule Take 1 g by mouth daily.     fluticasone (FLONASE) 50 MCG/ACT nasal spray Place 2 sprays into the nose daily.     fluticasone furoate-vilanterol (BREO ELLIPTA) 100-25 MCG/INH AEPB Inhale 1 puff into the lungs daily.  Glycerin-Hypromellose-PEG 400 ([employer] DRY EYE RELIEF OP) Apply 1 drop to eye at bedtime as needed (dry eyes).     hydrochlorothiazide (HYDRODIURIL) 25 MG tablet Take 25 mg by  mouth daily.     IRON PO Take 1 tablet by mouth daily.     lamoTRIgine (LAMICTAL) 100 MG tablet TAKE 2 TABLETS BY MOUTH TWICE  DAILY 360 tablet 0   lumateperone tosylate (CAPLYTA) 42 MG capsule Take 1 capsule (42 mg total) by mouth daily. 90 capsule 0   magnesium gluconate (MAGONATE) 500 MG tablet Take 500 mg by mouth daily.     Multiple Vitamins-Minerals (COMPLETE MULTIVITAMIN/MINERAL PO) Take 1 tablet by mouth daily.     pantoprazole (PROTONIX) 40 MG tablet Take 40 mg by mouth daily.     tiZANidine (ZANAFLEX) 2 MG tablet Take 2 mg by mouth every 8 (eight) hours as needed.     topiramate (TOPAMAX) 200 MG tablet Take 1 tablet (200 mg total) by mouth daily. (Patient taking differently: Take 100 mg by mouth daily.) 90 tablet 1   traZODone (DESYREL) 100 MG tablet TAKE 2 TABLETS BY MOUTH AT  BEDTIME AS NEEDED FOR SLEEP 180 tablet 3   vitamin E 1000 UNIT capsule Take 1,000 Units by mouth daily.     vitamin k 100 MCG tablet Take 100 mcg by mouth daily.     buPROPion (WELLBUTRIN XL) 150 MG 24 hr tablet 1 daily for 1 week then 2 each am (Patient not taking: Reported on 10/13/2022) 30 tablet 0   buPROPion (WELLBUTRIN XL) 300 MG 24 hr tablet Take 1 tablet (300 mg total) by mouth daily. (Patient not taking: Reported on 01/05/2023) 90 tablet 0   Suvorexant (BELSOMRA) 20 MG TABS Take 1 tablet (20 mg total) by mouth at bedtime. (Patient not taking: Reported on 01/05/2023) 90 tablet 0   No current facility-administered medications for this visit.    Medication Side Effects: None  Allergies:  Allergies  Allergen Reactions   Dilaudid [Hydromorphone Hcl] Other (See Comments)    Nightmares   Divalproex Sodium     Became comatose when she took Depakote and Geodon together.   Nsaids     "kidney problem"   Other Swelling    Nuts   Perphenazine-Amitriptyline Itching   Seroquel [Quetiapine Fumerate]     Past Medical History:  Diagnosis Date   Anxiety    Asthma    Bipolar disorder (HCC)    Cataracts,  bilateral    Depression    GERD (gastroesophageal reflux disease)    Hydatid cyst    Left   Hypertension    Kidney problem    RT kidney is smaller -> decreased output   Migraine    Osteoporosis 11/2016   T score -2.8  improved from prior DEXA   Right rotator cuff tear    Vertigo     Family History  Problem Relation Age of Onset   Hypertension Mother    Heart disease Mother    Stroke Mother    Diabetes Father    Hypertension Father    Heart disease Father    Stroke Father    Cancer Father        Lymphoma   Prostate cancer Brother    Prostate cancer Brother    Heart disease Brother    Stroke Paternal Uncle     Social History   Socioeconomic History   Marital status: Divorced    Spouse name: Not on file   Number of children: 1  Years of education: MSN   Highest education level: Not on file  Occupational History   Not on file  Tobacco Use   Smoking status: Never   Smokeless tobacco: Never  Vaping Use   Vaping status: Never Used  Substance and Sexual Activity   Alcohol use: Yes    Alcohol/week: 0.0 standard drinks of alcohol    Comment: Rare   Drug use: No   Sexual activity: Not Currently    Birth control/protection: Post-menopausal    Comment: 1st intercourse 74 yo-2 partners  Other Topics Concern   Not on file  Social History Narrative   Lives with husband and daughter   Caffeine use: 2 cups per day   Right handed   Social Determinants of Health   Financial Resource Strain: Not on file  Food Insecurity: Not on file  Transportation Needs: Not on file  Physical Activity: Not on file  Stress: Not on file  Social Connections: Not on file  Intimate Partner Violence: Not on file    Past Medical History, Surgical history, Social history, and Family history were reviewed and updated as appropriate.   Please see review of systems for further details on the patient's review from today.   Objective:   Physical Exam:  There were no vitals taken for  this visit.  Physical Exam Constitutional:      General: She is not in acute distress. Musculoskeletal:        General: No deformity.  Neurological:     Mental Status: She is alert and oriented to person, place, and time.     Coordination: Coordination normal.  Psychiatric:        Attention and Perception: Attention and perception normal. She does not perceive auditory or visual hallucinations.        Mood and Affect: Mood is anxious and depressed. Affect is not labile, blunt, angry or inappropriate.        Speech: Speech normal.        Behavior: Behavior normal. Behavior is not slowed.        Thought Content: Thought content normal. Thought content is not paranoid or delusional. Thought content does not include homicidal or suicidal ideation. Thought content does not include suicidal plan.        Cognition and Memory: Cognition and memory normal.        Judgment: Judgment normal.     Comments: Insight intact Not suicidal.   Neat and pleasant      Lab Review:     Component Value Date/Time   NA 137 01/02/2020 1122   K 3.8 01/02/2020 1122   CL 105 01/02/2020 1122   CO2 23 01/02/2020 1122   GLUCOSE 114 (H) 01/02/2020 1122   BUN 27 (H) 01/02/2020 1122   CREATININE 1.58 (H) 01/02/2020 1122   CALCIUM 9.3 01/02/2020 1122   PROT 8.4 (H) 01/02/2020 1122   ALBUMIN 5.0 01/02/2020 1122   AST 28 01/02/2020 1122   ALT 19 01/02/2020 1122   ALKPHOS 91 01/02/2020 1122   BILITOT 0.7 01/02/2020 1122   GFRNONAA 33 (L) 01/02/2020 1122   GFRAA 39 (L) 01/02/2020 1122       Component Value Date/Time   WBC 6.0 01/02/2020 1122   RBC 4.51 01/02/2020 1122   HGB 13.6 01/02/2020 1122   HCT 40.5 01/02/2020 1122   PLT 210 01/02/2020 1122   MCV 89.8 01/02/2020 1122   MCH 30.2 01/02/2020 1122   MCHC 33.6 01/02/2020 1122   RDW 11.9 01/02/2020  1122   LYMPHSABS 0.8 01/02/2020 1122   MONOABS 0.3 01/02/2020 1122   EOSABS 0.1 01/02/2020 1122   BASOSABS 0.0 01/02/2020 1122    No results found  for: "POCLITH", "LITHIUM"   No results found for: "PHENYTOIN", "PHENOBARB", "VALPROATE", "CBMZ"   .res Assessment: Plan:    Waldene was seen today for follow-up, depression, sleeping problem and anxiety.  Diagnoses and all orders for this visit:  Mixed bipolar II disorder with rapid cycling (HCC)  Generalized anxiety disorder  Insomnia due to mental condition  Migraine without aura and without status migrainosus, not intractable   30 min face to face time with patient was spent on counseling and coordination of care. We discussed Patient with a long history of bipolar disorder with rapid cycling that has been somewhat difficult to treat for a variety of reasons.  These include confounding migraine headaches which require a lot of medication and the fact that she is medication sensitive as well as having failed multiple medications.  In addition she refuses meds that can cause weight gain which greatly limit options.  2021 summer she was psych hospitalized which was originally attributed to an intentional overdose which the patient strongly denies.  There is no evidence that she intentionally overdosed or had any suicidal intent.  She is not currently suicidal and has not been her recent history.  She has been very overwhelmed with caring for her mother's estate by herself and stressed by grief.  She has started counseling which is helpful.  She has recently reduced a number of the anticonvulsants that were used to help her migraine headaches.   She is not currently confused.  She is not a acutely depressed but more stressed.  She is not manic.  She has no psychosis.  There is no evidence of substance abuse. HA manageable.  We discussed the short-term risks associated with benzodiazepines including sedation and increased fall risk among others.  Discussed long-term side effect risk including dependence, potential withdrawal symptoms, and the potential eventual dose-related risk of dementia.   But recent studies from 2020 dispute this association between benzodiazepines and dementia risk. Newer studies in 2020 do not support an association with dementia.  Disc this again with detail.  Disc try gradually reduce clonazepam.  Rec counseling for grief.  Contingue clonazepam Taking 1.5 mg HS. Disc risk more cycling without another stabilizer. DC Caplyta bc not helping sleep or insomnia Start Vraylar 1.5 mg retry daily for dep and bipolar DC DT cost Belsomra 20 mg HS Trial chlorpromazine 25-50 mg HS for sleep for TR insomnia continue lamotrigine 100 mg 2 tablets twice daily.  FU 2 mos  Meredith Staggers MD, DFAPA  Please see After Visit Summary for patient specific instructions.  No future appointments.      No orders of the defined types were placed in this encounter.   -------------------------------

## 2023-01-06 DIAGNOSIS — H26491 Other secondary cataract, right eye: Secondary | ICD-10-CM | POA: Diagnosis not present

## 2023-01-26 ENCOUNTER — Telehealth: Payer: Self-pay | Admitting: Psychiatry

## 2023-01-26 NOTE — Telephone Encounter (Signed)
Next appt is 03/09/23. Kelli Morales called and on last visit she was given samples of Vraylar to see if it would work for her. States it is just like the Caplyta and is over $600.00. There is no discount and she cannot afford it. She is not sure if she can get a better price for 30 days. She states that the medication has caused her to gain 4 lbs. Her weight has gone from 140 lbs to 152 lbs. She has stopped it. She wonders if she can go back on Wellbutrin as it helped her with her bipolar symptoms. Please call her at [phone]. Pharmacy is:  [facility] - [place], [place] - [address]   Phone: [phone]  Fax: [phone]

## 2023-01-27 NOTE — Telephone Encounter (Signed)
Patient said she cannot afford Vraylar, is a tier 5. She reports Thorazine is not working, only getting about 5 hours of sleep a night and reporting weight gain. Reports she was on Belsomra previously and it wasn't effective. Denies late afternoon caffeine use, stops about 1:00. Has also been on trazodone and clonazepam for anxiety/insomnia. She is asking to go back on Wellbutrin. Said she talked you into taking her off of it. She said she didn't really give it a long enough time, but would like to try it again, thought her insurance covered 100% of the cost.  On 300 XL. Reporting depression at 6.5/10. Did not rate anxiety, but said she is anxious over the cost of meds and not being able to afford them due to the high tiers.

## 2023-01-28 NOTE — Telephone Encounter (Signed)
Patient notified of recommendations. Will get samples together.

## 2023-01-28 NOTE — Telephone Encounter (Signed)
She complained of wt gain with Vraylar, Geodon, Wellbutrin (8#); none of which are associated with wt gain.  She is asking I stop Leafy Kindle which is both an antidepressant and a mood stabilizer and replace it with meds that is just an antidepressant.  She is only taking lamotrigine as a mood stabilizer at this point and is a very weak antimanic medicine.  Starting Wellbutrin without Vraylar at this time increases her risk of manic symptoms.  I do not like making these major med changes without seeing her.  Put her on the cancellation list and we can discuss the options when I see her. BTW, the cost of Leafy Kindle is solvable.  The reps will provide samples for her over a long period of time.  I think she should stay on Vraylar using samples until I see her and we can talk about it.

## 2023-01-28 NOTE — Telephone Encounter (Signed)
Please put patient on cancellation list per Dr. [name].

## 2023-02-13 ENCOUNTER — Other Ambulatory Visit: Payer: Self-pay | Admitting: Psychiatry

## 2023-02-13 DIAGNOSIS — F5105 Insomnia due to other mental disorder: Secondary | ICD-10-CM

## 2023-02-13 DIAGNOSIS — F3181 Bipolar II disorder: Secondary | ICD-10-CM

## 2023-02-13 DIAGNOSIS — F411 Generalized anxiety disorder: Secondary | ICD-10-CM

## 2023-02-27 DIAGNOSIS — Z1231 Encounter for screening mammogram for malignant neoplasm of breast: Secondary | ICD-10-CM | POA: Diagnosis not present

## 2023-03-04 ENCOUNTER — Telehealth: Payer: Self-pay | Admitting: Psychiatry

## 2023-03-04 DIAGNOSIS — F5105 Insomnia due to other mental disorder: Secondary | ICD-10-CM

## 2023-03-04 NOTE — Telephone Encounter (Signed)
Next visit is 03/09/23. Thomas said she gave out of her Trazodone  on this past Monday. She has not had any since then. [id number] [place], [place] - [address]   Phone: [phone]  Fax: [phone]

## 2023-03-05 MED ORDER — TRAZODONE HCL 100 MG PO TABS
ORAL_TABLET | ORAL | 0 refills | Status: AC
Start: 2023-03-05 — End: ?

## 2023-03-05 NOTE — Telephone Encounter (Signed)
Spoke with patient, she states that she ran out of her Trazodone without realizing. She was unable to request her refill with [facility] in time. She is seeking to have [facility] to [facility]  [address], this one time because if she uses mail order it will take many days and she needs her rx right away.

## 2023-03-05 NOTE — Telephone Encounter (Signed)
Reviewed

## 2023-03-05 NOTE — Telephone Encounter (Signed)
Rx sent to the requested pharmacy.

## 2023-03-05 NOTE — Telephone Encounter (Signed)
LVM TO RC

## 2023-03-05 NOTE — Telephone Encounter (Signed)
Please clarify with pt why she ran out of trazodone? Change in pharmacy or ?, is she still using mail order? Last Rx submitted was in 07/2022 to [facility] for a year supply. Is she only requesting a 30 day supply be sent to local [facility]?    Has apt 03/09/2023

## 2023-03-09 ENCOUNTER — Ambulatory Visit (INDEPENDENT_AMBULATORY_CARE_PROVIDER_SITE_OTHER): Payer: Medicare PPO | Admitting: Psychiatry

## 2023-03-09 ENCOUNTER — Encounter: Payer: Self-pay | Admitting: Psychiatry

## 2023-03-09 DIAGNOSIS — F3181 Bipolar II disorder: Secondary | ICD-10-CM | POA: Diagnosis not present

## 2023-03-09 DIAGNOSIS — M5136 Other intervertebral disc degeneration, lumbar region: Secondary | ICD-10-CM | POA: Diagnosis not present

## 2023-03-09 DIAGNOSIS — M810 Age-related osteoporosis without current pathological fracture: Secondary | ICD-10-CM | POA: Diagnosis not present

## 2023-03-09 DIAGNOSIS — F5105 Insomnia due to other mental disorder: Secondary | ICD-10-CM

## 2023-03-09 DIAGNOSIS — N183 Chronic kidney disease, stage 3 unspecified: Secondary | ICD-10-CM | POA: Diagnosis not present

## 2023-03-09 DIAGNOSIS — Z23 Encounter for immunization: Secondary | ICD-10-CM | POA: Diagnosis not present

## 2023-03-09 DIAGNOSIS — J4530 Mild persistent asthma, uncomplicated: Secondary | ICD-10-CM | POA: Diagnosis not present

## 2023-03-09 DIAGNOSIS — I129 Hypertensive chronic kidney disease with stage 1 through stage 4 chronic kidney disease, or unspecified chronic kidney disease: Secondary | ICD-10-CM | POA: Diagnosis not present

## 2023-03-09 DIAGNOSIS — D509 Iron deficiency anemia, unspecified: Secondary | ICD-10-CM | POA: Diagnosis not present

## 2023-03-09 DIAGNOSIS — F319 Bipolar disorder, unspecified: Secondary | ICD-10-CM | POA: Diagnosis not present

## 2023-03-09 DIAGNOSIS — K219 Gastro-esophageal reflux disease without esophagitis: Secondary | ICD-10-CM | POA: Diagnosis not present

## 2023-03-09 DIAGNOSIS — F411 Generalized anxiety disorder: Secondary | ICD-10-CM

## 2023-03-09 MED ORDER — TOPIRAMATE 200 MG PO TABS
200.0000 mg | ORAL_TABLET | Freq: Every day | ORAL | 1 refills | Status: DC
Start: 2023-03-09 — End: 2023-07-28

## 2023-03-09 MED ORDER — TRAZODONE HCL 100 MG PO TABS
ORAL_TABLET | ORAL | 0 refills | Status: DC
Start: 2023-03-09 — End: 2023-05-28

## 2023-03-09 MED ORDER — LAMOTRIGINE 100 MG PO TABS
200.0000 mg | ORAL_TABLET | Freq: Two times a day (BID) | ORAL | 0 refills | Status: DC
Start: 2023-03-09 — End: 2023-05-28

## 2023-03-09 NOTE — Progress Notes (Signed)
Kelli Morales [id number] [birth year] 72 y.o.   Subjective:   Patient ID:  Kelli Morales is a 72 y.o. (DOB 09-08-1950) female.  Chief Complaint:  Chief Complaint  Patient presents with   Follow-up   Depression   Anxiety   Sleeping Problem    Depression        Associated symptoms include headaches.  Past medical history includes anxiety.   Anxiety Symptoms include nervous/anxious behavior. Patient reports no dizziness or palpitations.     Kelli Morales presents to the office today for follow-up of bipolar disorder.  hospitalization July 2021 for alleged intentional overdose of medications.  Discharged 01/02/2020 on clonazepam 0.5 mg 3 times daily as needed, lamotrigine 400 mg twice daily, topiramate 200 mg nightly, trazodone 300 mg nightly, and ziprasidone 40 mg twice daily, and zonisamide 300 mg daily.  12/01/2019 appointment notable for patient having seen significant mood stability and reduction of manic symptoms and depression with an increase in lamotrigine and the addition of Geodon 20 mg twice daily.  No meds were changed.  01/18/20 appt with the following noted: Disc recent hospitalization.  Said she felt weak the day of hospitalization and grief and got distraught.  She denied over taking meds and got pulled over by police for driving erratically.   She denies intentional overdose but she was dehydrated. Upset over being at behavioral health hosp of 2 days. Lamotrigine reduced to 1 daily. Reduced Zonesimide gradually per MD. Reduced topiramate to 100 mg daily. Says she ate normally that day.   Says she's always been sensitive to Geodon but is doing OK with it right now.  No other explanation for what happened that day except she was distraught and overwhelmed with mother's estate. Started grief therapy at Hospice. Plan no med changes as recently hospitalized  02/10/2020 appt with the following noted: CO weight gain and thinks it is Geodon.  Gained weight gain on  Seroquel and hasn't lost it.  Wants to wean off clonazepam.  Has cut so many meds back dramatically. BP has come back to normal. I feel really good.  Calmer and more focused.  No brain fog.  No sadness.  Don't get as hyper.  Anxiety not out of control.  Better overall. No HA.  Meditation and therapy and it helped to wean off migraine meds.   Now more ready to let go of clonazepam.   Sleep variable.  Get wound up at work and can have a hard time sleeping but not too bad. Plan: Disc her desire to taper clonazepam.  Need to protect sleep.  Taking 1.5 mg HS. Reduce no faster than 0.25 mg reduction per month.   02/22/2020 phone call from patient complaining of 10 pound weight gain since January 04, 2020 when starting Geodon 40 mg twice daily. MD response:She has tried all of the mood stabilizers with the lowest weight gain risk.  She is not overweight.  But she is very focused on her weight.  The only alternative I see that she is not taking with low weight gain risk is haloperidol and typically ziprasidone or Geodon has even lower weight gain risk than haloperidol but there is individual variation. She had a psychiatric hospitalization on Geodon 20 mg twice daily so I would not be in favor of reducing the dose below Geodon 40 mg twice daily.  03/19/20 appt with following noted: Took herself off the Geodon bc jitters and weight gain. Restarted lamotrigine at 100 mg BID. Has gained from 128#  to 155 # and she attributes to meds including quetiapine and perphenazine. Some anxiety.  Still on clonazepam 1.5 mg daily.  Occ trazodone prn. Patient reports stable mood and denies depressed or irritable moods.   Patient denies difficulty with sleep initiation or maintenance. Denies appetite disturbance.  Patient reports that energy and motivation have been good.  Patient denies any difficulty with concentration.  Patient denies any suicidal ideation.  07/18/2020 appt with following noted: New job end of December.   Doing well with it. Agency directoer and DON. Ongoing problem with weight gain 14#/6 mos.  CO worse.  Doesn't know why.  On [employer], walking.  Taking clonazepam 1.5 mg HS. "Probably not enough sleep", but sleeps 8-4.  Same sleep pattern for several years. No sig periods of hypomania except brief after mother's death. Patient reports stable mood and denies depressed or irritable moods.  Patient denies any recent difficulty with anxiety.  Patient denies difficulty with sleep initiation or maintenance. Denies appetite disturbance.  Patient reports that energy and motivation have been good.  Patient denies any difficulty with concentration.  Patient denies any suicidal ideation. Plan: Contingue clonazepam Taking 1.5 mg HS. It has some mood stabilizing effects and she has restarted the lamotrigine 100 mg twice daily.  She agrees to continue these 2 medications.  She agrees.  1/10/20023 appt noted: B and M passed away.  Called in 04-30-23 crying and with insomnia. Rough time.  She had driven need to back track on knowing everything about what happened before brother died.  Did same thing after M died.  Couldn't let it go and obsessed on it. Tried Hospice without help. Poor sleep.  Initial with mind racing on deaths and what she needs to do. Taking klonopin 1.5 mg HS, trazodone 100 mg HS. Recognizes couldn't focus and memory issues after brother's death. Plan: Contingue clonazepam Taking 1.5 mg HS. It has some mood stabilizing effects and she has restarted the lamotrigine 100 mg twice daily.  She agrees to continue these 2 medications.  She agrees. Trial Caplyta 42 mg for bipolar depression and chronic insomnia.  06/21/2021 TC saying Caplyta was working.  07/04/2021 appt noted: The way my life is will be hard to not have some depression.  Not sadness.  Caplyta seems to work for sx so far and seems less down.  On it for 2 weeks.  Can't tell about weight gain yet. Exercising and diet healthier. Sleep is better  with Caplyta.  Thinks it is helping so far. Not sure the cost yet.   Uncle died and going to [place].   Sx of loneliness but has friends and family.  Feels lonely even in big group.  Still dealing with grief with M and B.   Plan: Contingue clonazepam Taking 1.5 mg HS. It has some mood stabilizing effects and she has restarted the lamotrigine 100 mg twice daily.  She agrees to continue these 2 medications.  She agrees. Continue Caplyta 42 mg for bipolar depression and chronic insomnia. Trazodone prn  08/14/2021 phone call saying she was no longer taking Caplyta because it was too expensive and also because she feared weight gain with it. 08/21/2021 phone call complaining of insomnia and wanting to increase trazodone.  Okay to increase trazodone to 200 mg nightly  11/27/2021 appointment with the following noted: Still dealing with HA.  Dr. Neale Burly, he increased to 200 mg daily. Lost 27# since 04-30-23. B died in 03-30-2023 and started her on spiral down.  Other B dx  prostate CA.  Worried over him.  I was such a wreck.  Nephew died of drugs at 5. Just back from  [place] a week ago. Trouble sleeping since October; initial and terminal.  To bed 8 and up 430.  Falling asleep 10 and awake at 3.   Down to clonazepam 0.5 mg 2 at night.  Anxiety better with exercise.  Has dumped a couple of BF and niece said she's needing therapy.  Not sure why. Better with grief now than she was.  Not depressed.  Minimal manic sx Plan: Contingue clonazepam Taking 1.5 mg HS. It has some mood stabilizing effects and she has restarted the lamotrigine 100 mg twice daily.  She agrees to continue these 2 medications.  She agrees. Disc risk more cycling without another stabilizer. Hydroxyzine trial 10-20 mg instead of trazodone Or samples Belsomra given    04/08/22 appt noted: Current psychiatry relevant meds include Belsomra 20 mg nightly, clonazepam 0.5 mg tablets 3 nightly, lamotrigine 100 mg twice daily, trazodone 200 mg nightly,  topiramate reduced to 50 mg daily Was doing pretty well until Oct which is hard bc anniversary of brother's death.   She asked about increasing lamotrigine back up.   Did ok with sleep awhile at 2 clonazepam at night but then as fall approached then needed to go back to 3 of 0.5 mg HS to sleep.  Hard time falling asleep and turning off thoughts. No mood swings.   Working PT job.  Doing some shift work. Plan: Contingue clonazepam Taking 1.5 mg HS. It has some mood stabilizing effects and she has restarted the lamotrigine 100 mg twice daily.  She agrees to continue these 2 medications.  She agrees. Disc risk more cycling without another stabilizer. Continue Belsomra 20 mg HS Ok increase lamotrigine as noted: Increase lamotrigine to 1 and 1/2 tablets twice daily for 2 weeks then 2 tablets twice daily.  08/11/22 appt noted: Struggling.  Grief worsened depression.  Gained wt.  Isolating more.  Family babies her. Still working.  Not sure what to do with life.  Worse since beginning of theyear.  Lost a lot of family last year.  Regrets including staying married too long in a bad marriage.   Don't sleep well or eat right. Work 2nd shift.  Don't sleep enough and 6 hours.  Uses sleep hygiene.   Some counseling 2022 when lost her brother.  Has done Hospice counseling 3 times. More down than anxious but is taking clonazepam 1.5 mg Hs. Plan: Contingue clonazepam Taking 1.5 mg HS. Disc risk more cycling without another stabilizer. Continue Belsomra 20 mg HS Start Wellbutrin for depression and increase to 300 mg AM  lamotrigine to 1 and 1/2 tablets twice daily for 2 weeks then 2 tablets twice daily.  10/13/22 appt noted: Dep with feelings worthlessness.  Still tries to socialize and get out with friends.  They recognize her dep. Wellb slowly increased BP.  Dr. Cliffton Asters increased BP meds. Researching on internet.   Asks about stopping Wellbutrin and using Caplyta again.  She thinks it helped.   She's gained  8# since here.   Plan: Contingue clonazepam Taking 1.5 mg HS. Disc risk more cycling without another stabilizer. She agrees to restart Caplyta 42 mg daily. Continue Belsomra 20 mg HS Stop Wellbutrin continue lamotrigine 100 mg 2 tablets twice daily.  12/03/22 TC from pt with staff:  [facility]'s cost of Caplyta is $638/month. She applied for Pt. Assistance but has been denied because she has  insurance. I have talked with the program, and they indicated to me that anyone with insurance not matter the cost of the medication will not qualify for assistance. I asked if she was at the poverty level would they reconsider, and I was told no that would not matter.  MD resp: Thanks . Give her enough samples until appt. We can talk with rep and see if she can help. I have some stashed away, I can give her if needed to.   01/05/23 appt noted: "I'm not going to take Caplyta anymore".  Worries over the cost.  Can't handle that.   Hard to judge the effect on mood.  Not getting enough sleep.  Had to stop Belsomra since early June.   Caplyta doesn't seem to help sleep.  Mostly trouble staying asleep.  Clonazepam 1.5 mg HS doesnot help her stay asleep.   Avg 4-5 hours sleep.   Anxious about her money.  Semi-retired.   She thinks she made impulsive decision about stopping the Wellbutrin. Visited B and he noticed she is obsessive about wt and has tendency to make impulsive decisions.  Recognizes she is obsessive about wt concerns and it's been there a long tim.  Working on improving self. She senses more dep with sadness, crying, isolation.  Ended 2 rel with men without good reason.  Still goes to church.   Meds: clonazepam 1.5 mg HS and sometimes prn anxiety, no Wellbutrin, Caplyta 42 mg HS, lamotrigine 42 mg HS, no Belsomra DT cost, trazodone 200 mg HS,   01/26/23 TC: Franchot Erichsen, [place]    01/27/23  5:54 PM Note Patient said she cannot afford Vraylar, is a tier 5. She reports Thorazine is not working, only  getting about 5 hours of sleep a night and reporting weight gain. Reports she was on Belsomra previously and it wasn't effective. Denies late afternoon caffeine use, stops about 1:00. Has also been on trazodone and clonazepam for anxiety/insomnia. She is asking to go back on Wellbutrin. Said she talked you into taking her off of it. She said she didn't really give it a long enough time, but would like to try it again, thought her insurance covered 100% of the cost.  On 300 XL. Reporting depression at 6.5/10. Did not rate anxiety, but said she is anxious over the cost of meds and not being able to afford them due to the high tiers.     01/28/23 MD note: She complained of wt gain with Vraylar, Geodon, Wellbutrin (8#); none of which are associated with wt gain.  She is asking I stop Leafy Kindle which is both an antidepressant and a mood stabilizer and replace it with meds that is just an antidepressant.  She is only taking lamotrigine as a mood stabilizer at this point and is a very weak antimanic medicine.  Starting Wellbutrin without Vraylar at this time increases her risk of manic symptoms.  I do not like making these major med changes without seeing her.  Put her on the cancellation list and we can discuss the options when I see her. BTW, the cost of Leafy Kindle is solvable.  The reps will provide samples for her over a long period of time.  I think she should stay on Vraylar using samples until I see her and we can talk about it.     03/09/23 appt noted: Still taking Vraylar and it is doing good.  Mood is better.  Main thing is stress. Mostly dealing with stress causing wt  gain.   Lost 5# since failure.   Sleep is somewhat better. Seeing a judge as date and going well.    Past Psychiatric Medication Trials: Vraylar early weight gain.  Perphenazine 8 mg SE itching and wt, quetiapine 300 mg, Abilify 10,  Geodon 40 BID,  Risperidone, latuda 5# weight gain Caplyta cost problems Lithium, Depakote, CBZ 300  sleepiness. Lamotrigine 300  Mirtazapine wt gain  Trazodone 200 NR, hydroxyzine NR, Belsomra 20 Clonazepam 1.5 mg HS paroxetine, Lexapro, sertraline, duloxetine 120,  Psychiatric hospitalizations summer 2021  Review of Systems:  Review of Systems  Constitutional:  Positive for unexpected weight change.  Cardiovascular:  Negative for palpitations.  Neurological:  Positive for headaches. Negative for dizziness, tremors and light-headedness.  Psychiatric/Behavioral:  Positive for sleep disturbance. Negative for agitation and dysphoric mood. The patient is nervous/anxious.     Medications: I have reviewed the patient's current medications.  Current Outpatient Medications  Medication Sig Dispense Refill   albuterol (VENTOLIN HFA) 108 (90 Base) MCG/ACT inhaler      amLODipine (NORVASC) 10 MG tablet Take 1 tablet (10 mg total) by mouth daily. 30 tablet 0   Ascorbic Acid (VITAMIN C PO) Take 1 tablet by mouth daily.     CALCIUM PO Take 1 tablet by mouth daily.     cariprazine (VRAYLAR) 1.5 MG capsule Take 1.5 mg by mouth daily.     cholecalciferol (VITAMIN D) 1000 UNITS tablet Take 1,000 Units by mouth 2 (two) times daily.     clonazePAM (KLONOPIN) 0.5 MG tablet TAKE 1 TABLET (0.5 MG TOTAL) BY MOUTH 3 (THREE) TIMES DAILY AS NEEDED FOR ANXIETY (AND INSOMNIA). 270 tablet 0   Cyanocobalamin (VITAMIN B 12 PO) Take by mouth.     denosumab (PROLIA) 60 MG/ML SOLN injection Inject 60 mg into the skin every 6 (six) months. Administer in upper arm, thigh, or abdomen     fish oil-omega-3 fatty acids 1000 MG capsule Take 1 g by mouth daily.     fluticasone (FLONASE) 50 MCG/ACT nasal spray Place 2 sprays into the nose daily.     fluticasone furoate-vilanterol (BREO ELLIPTA) 100-25 MCG/INH AEPB Inhale 1 puff into the lungs daily.     Glycerin-Hypromellose-PEG 400 ([employer] DRY EYE RELIEF OP) Apply 1 drop to eye at bedtime as needed (dry eyes).     hydrochlorothiazide (HYDRODIURIL) 25 MG tablet Take 25 mg by  mouth daily.     IRON PO Take 1 tablet by mouth daily.     magnesium gluconate (MAGONATE) 500 MG tablet Take 500 mg by mouth daily.     Multiple Vitamins-Minerals (COMPLETE MULTIVITAMIN/MINERAL PO) Take 1 tablet by mouth daily.     pantoprazole (PROTONIX) 40 MG tablet Take 40 mg by mouth daily.     tiZANidine (ZANAFLEX) 2 MG tablet Take 2 mg by mouth every 8 (eight) hours as needed.     vitamin E 1000 UNIT capsule Take 1,000 Units by mouth daily.     vitamin k 100 MCG tablet Take 100 mcg by mouth daily.     lamoTRIgine (LAMICTAL) 100 MG tablet Take 2 tablets (200 mg total) by mouth 2 (two) times daily. 360 tablet 0   topiramate (TOPAMAX) 200 MG tablet Take 1 tablet (200 mg total) by mouth daily. 90 tablet 1   traZODone (DESYREL) 100 MG tablet TAKE 2 TABLETS BY MOUTH AT  BEDTIME AS NEEDED FOR SLEEP 180 tablet 0   No current facility-administered medications for this visit.    Medication Side Effects:  None  Allergies:  Allergies  Allergen Reactions   Dilaudid [Hydromorphone Hcl] Other (See Comments)    Nightmares   Divalproex Sodium     Became comatose when she took Depakote and Geodon together.   Nsaids     "kidney problem"   Other Swelling    Nuts   Perphenazine-Amitriptyline Itching   Seroquel [Quetiapine Fumerate]     Past Medical History:  Diagnosis Date   Anxiety    Asthma    Bipolar disorder (HCC)    Cataracts, bilateral    Depression    GERD (gastroesophageal reflux disease)    Hydatid cyst    Left   Hypertension    Kidney problem    RT kidney is smaller -> decreased output   Migraine    Osteoporosis 11/2016   T score -2.8  improved from prior DEXA   Right rotator cuff tear    Vertigo     Family History  Problem Relation Age of Onset   Hypertension Mother    Heart disease Mother    Stroke Mother    Diabetes Father    Hypertension Father    Heart disease Father    Stroke Father    Cancer Father        Lymphoma   Prostate cancer Brother    Prostate  cancer Brother    Heart disease Brother    Stroke Paternal Uncle     Social History   Socioeconomic History   Marital status: Divorced    Spouse name: Not on file   Number of children: 1   Years of education: MSN   Highest education level: Not on file  Occupational History   Not on file  Tobacco Use   Smoking status: Never   Smokeless tobacco: Never  Vaping Use   Vaping status: Never Used  Substance and Sexual Activity   Alcohol use: Yes    Alcohol/week: 0.0 standard drinks of alcohol    Comment: Rare   Drug use: No   Sexual activity: Not Currently    Birth control/protection: Post-menopausal    Comment: 1st intercourse 51 yo-2 partners  Other Topics Concern   Not on file  Social History Narrative   Lives with husband and daughter   Caffeine use: 2 cups per day   Right handed   Social Determinants of Health   Financial Resource Strain: Not on file  Food Insecurity: Not on file  Transportation Needs: Not on file  Physical Activity: Not on file  Stress: Not on file  Social Connections: Not on file  Intimate Partner Violence: Not on file    Past Medical History, Surgical history, Social history, and Family history were reviewed and updated as appropriate.   Please see review of systems for further details on the patient's review from today.   Objective:   Physical Exam:  There were no vitals taken for this visit.  Physical Exam Constitutional:      General: She is not in acute distress. Musculoskeletal:        General: No deformity.  Neurological:     Mental Status: She is alert and oriented to person, place, and time.     Coordination: Coordination normal.  Psychiatric:        Attention and Perception: Attention and perception normal. She does not perceive auditory or visual hallucinations.        Mood and Affect: Mood is anxious. Mood is not depressed. Affect is not labile, blunt, angry or inappropriate.  Speech: Speech normal.        Behavior:  Behavior normal. Behavior is not slowed.        Thought Content: Thought content normal. Thought content is not paranoid or delusional. Thought content does not include homicidal or suicidal ideation. Thought content does not include suicidal plan.        Cognition and Memory: Cognition and memory normal.        Judgment: Judgment normal.     Comments: Insight intact Not suicidal.   Neat and pleasant Mood better with Vraylar      Lab Review:     Component Value Date/Time   NA 137 01/02/2020 1122   K 3.8 01/02/2020 1122   CL 105 01/02/2020 1122   CO2 23 01/02/2020 1122   GLUCOSE 114 (H) 01/02/2020 1122   BUN 27 (H) 01/02/2020 1122   CREATININE 1.58 (H) 01/02/2020 1122   CALCIUM 9.3 01/02/2020 1122   PROT 8.4 (H) 01/02/2020 1122   ALBUMIN 5.0 01/02/2020 1122   AST 28 01/02/2020 1122   ALT 19 01/02/2020 1122   ALKPHOS 91 01/02/2020 1122   BILITOT 0.7 01/02/2020 1122   GFRNONAA 33 (L) 01/02/2020 1122   GFRAA 39 (L) 01/02/2020 1122       Component Value Date/Time   WBC 6.0 01/02/2020 1122   RBC 4.51 01/02/2020 1122   HGB 13.6 01/02/2020 1122   HCT 40.5 01/02/2020 1122   PLT 210 01/02/2020 1122   MCV 89.8 01/02/2020 1122   MCH 30.2 01/02/2020 1122   MCHC 33.6 01/02/2020 1122   RDW 11.9 01/02/2020 1122   LYMPHSABS 0.8 01/02/2020 1122   MONOABS 0.3 01/02/2020 1122   EOSABS 0.1 01/02/2020 1122   BASOSABS 0.0 01/02/2020 1122    No results found for: "POCLITH", "LITHIUM"   No results found for: "PHENYTOIN", "PHENOBARB", "VALPROATE", "CBMZ"   .res Assessment: Plan:    Rema was seen today for follow-up, depression, anxiety and sleeping problem.  Diagnoses and all orders for this visit:  Mixed bipolar II disorder with rapid cycling (HCC) -     lamoTRIgine (LAMICTAL) 100 MG tablet; Take 2 tablets (200 mg total) by mouth 2 (two) times daily. -     topiramate (TOPAMAX) 200 MG tablet; Take 1 tablet (200 mg total) by mouth daily.  Insomnia due to mental condition -      traZODone (DESYREL) 100 MG tablet; TAKE 2 TABLETS BY MOUTH AT  BEDTIME AS NEEDED FOR SLEEP  Generalized anxiety disorder -     topiramate (TOPAMAX) 200 MG tablet; Take 1 tablet (200 mg total) by mouth daily.    30 min face to face time with patient was spent on counseling and coordination of care. We discussed Patient with a long history of bipolar disorder with rapid cycling that has been somewhat difficult to treat for a variety of reasons.  These include confounding migraine headaches which require a lot of medication and the fact that she is medication sensitive as well as having failed multiple medications.  In addition she refuses meds that can cause weight gain which greatly limit options.  2021 summer she was psych hospitalized which was originally attributed to an intentional overdose which the patient strongly denies.  There is no evidence that she intentionally overdosed or had any suicidal intent.  She is not currently suicidal and has not been her recent history.  She was very overwhelmed with caring for her mother's estate by herself and stressed by grief.  counseling helped.  She has reduced a number of the anticonvulsants that were used to help her migraine headaches.   She is not currently confused.  She is not a acutely depressed but some chronic stressed.  She is not manic.  She has no psychosis.  There is no evidence of substance abuse. HA manageable.  Overall better mood stability with Vraylar but worried over cost.  Tolerating it.  We discussed the short-term risks associated with benzodiazepines including sedation and increased fall risk among others.  Discussed long-term side effect risk including dependence, potential withdrawal symptoms, and the potential eventual dose-related risk of dementia.  But recent studies from 2020 dispute this association between benzodiazepines and dementia risk. Newer studies in 2020 do not support an association with dementia.  Disc this again  with detail.  Disc try gradually reduce clonazepam.  Contingue clonazepam Taking 1.5 mg HS. Disc risk more cycling without another stabilizer. Better with Vraylar 1.5 mg daily for dep and bipolar  and tolerating it. continue lamotrigine 100 mg 2 tablets twice daily. Sleep better with mood stability from Vraylar  FU 2 -3 mos  Meredith Staggers MD, DFAPA  Please see After Visit Summary for patient specific instructions.  Future Appointments  Date Time Provider Department Center  05/04/2023  3:30 PM Cherie Ouch, PA-C [facility] None        No orders of the defined types were placed in this encounter.   -------------------------------

## 2023-05-04 ENCOUNTER — Ambulatory Visit (INDEPENDENT_AMBULATORY_CARE_PROVIDER_SITE_OTHER): Payer: Medicare PPO | Admitting: Physician Assistant

## 2023-05-17 ENCOUNTER — Other Ambulatory Visit: Payer: Self-pay | Admitting: Psychiatry

## 2023-05-17 DIAGNOSIS — F411 Generalized anxiety disorder: Secondary | ICD-10-CM

## 2023-05-17 DIAGNOSIS — F5105 Insomnia due to other mental disorder: Secondary | ICD-10-CM

## 2023-05-17 DIAGNOSIS — F3181 Bipolar II disorder: Secondary | ICD-10-CM

## 2023-05-25 ENCOUNTER — Ambulatory Visit: Payer: Medicare PPO | Admitting: Psychiatry

## 2023-05-28 ENCOUNTER — Other Ambulatory Visit: Payer: Self-pay | Admitting: Psychiatry

## 2023-05-28 DIAGNOSIS — F3181 Bipolar II disorder: Secondary | ICD-10-CM

## 2023-05-28 DIAGNOSIS — F5105 Insomnia due to other mental disorder: Secondary | ICD-10-CM

## 2023-06-11 ENCOUNTER — Other Ambulatory Visit: Payer: Self-pay

## 2023-06-11 ENCOUNTER — Telehealth: Payer: Self-pay | Admitting: Psychiatry

## 2023-06-11 DIAGNOSIS — F3181 Bipolar II disorder: Secondary | ICD-10-CM

## 2023-06-11 DIAGNOSIS — F5105 Insomnia due to other mental disorder: Secondary | ICD-10-CM

## 2023-06-11 DIAGNOSIS — F411 Generalized anxiety disorder: Secondary | ICD-10-CM

## 2023-06-11 NOTE — Telephone Encounter (Signed)
 Kelli Morales called at 3:30 to request refill of her Clonazepam  but was wanting it to be a 90 day supply so she doesn't have to call every month.  She said it used to always be 90 day but last it was only 30 day. So if possible, can it go back to 90 day?  Appt 07/28/23.  Send to [facility] on [address]

## 2023-06-15 ENCOUNTER — Other Ambulatory Visit: Payer: Self-pay | Admitting: Psychiatry

## 2023-06-15 DIAGNOSIS — F3181 Bipolar II disorder: Secondary | ICD-10-CM

## 2023-06-15 DIAGNOSIS — F5105 Insomnia due to other mental disorder: Secondary | ICD-10-CM

## 2023-06-15 DIAGNOSIS — F411 Generalized anxiety disorder: Secondary | ICD-10-CM

## 2023-06-15 MED ORDER — CLONAZEPAM 0.5 MG PO TABS: 0.5000 mg | ORAL_TABLET | Freq: Three times a day (TID) | ORAL | 0 refills | Status: DC | PRN

## 2023-06-15 NOTE — Telephone Encounter (Signed)
Sent 90 day ?

## 2023-06-15 NOTE — Telephone Encounter (Signed)
 Lf 12/9; lv 09/30; nv 2/18 pt is requesting 90 day supply, is that ok?

## 2023-07-17 DIAGNOSIS — G43019 Migraine without aura, intractable, without status migrainosus: Secondary | ICD-10-CM | POA: Diagnosis not present

## 2023-07-28 ENCOUNTER — Ambulatory Visit: Payer: Medicare HMO | Admitting: Psychiatry

## 2023-07-28 ENCOUNTER — Encounter: Payer: Self-pay | Admitting: Psychiatry

## 2023-07-28 DIAGNOSIS — G43009 Migraine without aura, not intractable, without status migrainosus: Secondary | ICD-10-CM | POA: Diagnosis not present

## 2023-07-28 DIAGNOSIS — F3181 Bipolar II disorder: Secondary | ICD-10-CM

## 2023-07-28 DIAGNOSIS — F5105 Insomnia due to other mental disorder: Secondary | ICD-10-CM | POA: Diagnosis not present

## 2023-07-28 DIAGNOSIS — F411 Generalized anxiety disorder: Secondary | ICD-10-CM | POA: Diagnosis not present

## 2023-07-28 MED ORDER — TRAZODONE HCL 100 MG PO TABS: ORAL_TABLET | ORAL | 0 refills | Status: DC

## 2023-07-28 MED ORDER — ZIPRASIDONE HCL 20 MG PO CAPS: ORAL_CAPSULE | ORAL | 1 refills | Status: DC

## 2023-07-28 MED ORDER — TOPIRAMATE 200 MG PO TABS: 200.0000 mg | ORAL_TABLET | Freq: Every day | ORAL | 1 refills | Status: DC

## 2023-07-28 MED ORDER — LAMOTRIGINE 100 MG PO TABS: 200.0000 mg | ORAL_TABLET | Freq: Two times a day (BID) | ORAL | 0 refills | Status: DC

## 2023-07-28 NOTE — Progress Notes (Addendum)
 Kelli Morales [id number] 1950/06/11 73 y.o.   Subjective:   Patient ID:  Kelli Morales is a 73 y.o. (DOB [birth year]) female.  Chief Complaint:  Chief Complaint  Patient presents with   Follow-up   Depression   Anxiety   Sleeping Problem   Medication Reaction    Depression        Associated symptoms include headaches.  Associated symptoms include no decreased concentration.  Past medical history includes anxiety.   Anxiety Symptoms include nervous/anxious behavior. Patient reports no decreased concentration, dizziness or palpitations.     Kelli Morales presents to the office today for follow-up of bipolar disorder.  hospitalization July 2021 for alleged intentional overdose of medications.  Discharged 01/02/2020 on clonazepam 0.5 mg 3 times daily as needed, lamotrigine 400 mg twice daily, topiramate 200 mg nightly, trazodone 300 mg nightly, and ziprasidone 40 mg twice daily, and zonisamide 300 mg daily.  12/01/2019 appointment notable for patient having seen significant mood stability and reduction of manic symptoms and depression with an increase in lamotrigine and the addition of Geodon 20 mg twice daily.  No meds were changed.  01/18/20 appt with the following noted: Disc recent hospitalization.  Said she felt weak the day of hospitalization and grief and got distraught.  She denied over taking meds and got pulled over by police for driving erratically.   She denies intentional overdose but she was dehydrated. Upset over being at behavioral health hosp of 2 days. Lamotrigine reduced to 1 daily. Reduced Zonesimide gradually per MD. Reduced topiramate to 100 mg daily. Says she ate normally that day.   Says she's always been sensitive to Geodon but is doing OK with it right now.  No other explanation for what happened that day except she was distraught and overwhelmed with mother's estate. Started grief therapy at Hospice. Plan no med changes as recently  hospitalized  02/10/2020 appt with the following noted: CO weight gain and thinks it is Geodon.  Gained weight gain on Seroquel and hasn't lost it.  Wants to wean off clonazepam.  Has cut so many meds back dramatically. BP has come back to normal. I feel really good.  Calmer and more focused.  No brain fog.  No sadness.  Don't get as hyper.  Anxiety not out of control.  Better overall. No HA.  Meditation and therapy and it helped to wean off migraine meds.   Now more ready to let go of clonazepam.   Sleep variable.  Get wound up at work and can have a hard time sleeping but not too bad. Plan: Disc her desire to taper clonazepam.  Need to protect sleep.  Taking 1.5 mg HS. Reduce no faster than 0.25 mg reduction per month.   02/22/2020 phone call from patient complaining of 10 pound weight gain since January 04, 2020 when starting Geodon 40 mg twice daily. MD response:She has tried all of the mood stabilizers with the lowest weight gain risk.  She is not overweight.  But she is very focused on her weight.  The only alternative I see that she is not taking with low weight gain risk is haloperidol and typically ziprasidone or Geodon has even lower weight gain risk than haloperidol but there is individual variation. She had a psychiatric hospitalization on Geodon 20 mg twice daily so I would not be in favor of reducing the dose below Geodon 40 mg twice daily.  03/19/20 appt with following noted: Took herself off the Geodon bc jitters  and weight gain. Restarted lamotrigine at 100 mg BID. Has gained from 128# to 155 # and she attributes to meds including quetiapine and perphenazine. Some anxiety.  Still on clonazepam 1.5 mg daily.  Occ trazodone prn. Patient reports stable mood and denies depressed or irritable moods.   Patient denies difficulty with sleep initiation or maintenance. Denies appetite disturbance.  Patient reports that energy and motivation have been good.  Patient denies any difficulty with  concentration.  Patient denies any suicidal ideation.  07/18/2020 appt with following noted: New job end of December.  Doing well with it. Agency directoer and DON. Ongoing problem with weight gain 14#/6 mos.  CO worse.  Doesn't know why.  On [employer], walking.  Taking clonazepam 1.5 mg HS. "Probably not enough sleep", but sleeps 8-4.  Same sleep pattern for several years. No sig periods of hypomania except brief after mother's death. Patient reports stable mood and denies depressed or irritable moods.  Patient denies any recent difficulty with anxiety.  Patient denies difficulty with sleep initiation or maintenance. Denies appetite disturbance.  Patient reports that energy and motivation have been good.  Patient denies any difficulty with concentration.  Patient denies any suicidal ideation. Plan: Contingue clonazepam Taking 1.5 mg HS. It has some mood stabilizing effects and she has restarted the lamotrigine 100 mg twice daily.  She agrees to continue these 2 medications.  She agrees.  1/10/20023 appt noted: B and M passed away.  Called in 25-May-2024 crying and with insomnia. Rough time.  She had driven need to back track on knowing everything about what happened before brother died.  Did same thing after M died.  Couldn't let it go and obsessed on it. Tried Hospice without help. Poor sleep.  Initial with mind racing on deaths and what she needs to do. Taking klonopin 1.5 mg HS, trazodone 100 mg HS. Recognizes couldn't focus and memory issues after brother's death. Plan: Contingue clonazepam Taking 1.5 mg HS. It has some mood stabilizing effects and she has restarted the lamotrigine 100 mg twice daily.  She agrees to continue these 2 medications.  She agrees. Trial Caplyta 42 mg for bipolar depression and chronic insomnia.  06/21/2021 TC saying Caplyta was working.  07/04/2021 appt noted: The way my life is will be hard to not have some depression.  Not sadness.  Caplyta seems to work for sx so far and  seems less down.  On it for 2 weeks.  Can't tell about weight gain yet. Exercising and diet healthier. Sleep is better with Caplyta.  Thinks it is helping so far. Not sure the cost yet.   Uncle died and going to MD.   Sx of loneliness but has friends and family.  Feels lonely even in big group.  Still dealing with grief with M and B.   Plan: Contingue clonazepam Taking 1.5 mg HS. It has some mood stabilizing effects and she has restarted the lamotrigine 100 mg twice daily.  She agrees to continue these 2 medications.  She agrees. Continue Caplyta 42 mg for bipolar depression and chronic insomnia. Trazodone prn  08/14/2021 phone call saying she was no longer taking Caplyta because it was too expensive and also because she feared weight gain with it. 08/21/2021 phone call complaining of insomnia and wanting to increase trazodone.  Okay to increase trazodone to 200 mg nightly  11/27/2021 appointment with the following noted: Still dealing with HA.  Dr. Neale Burly, he increased to 200 mg daily. Lost 27# since 05/25/24. B  died in October and started her on spiral down.  Other B dx prostate CA.  Worried over him.  I was such a wreck.  Nephew died of drugs at 20. Just back from  [place] a week ago. Trouble sleeping since October; initial and terminal.  To bed 8 and up 430.  Falling asleep 10 and awake at 3.   Down to clonazepam 0.5 mg 2 at night.  Anxiety better with exercise.  Has dumped a couple of BF and niece said she's needing therapy.  Not sure why. Better with grief now than she was.  Not depressed.  Minimal manic sx Plan: Contingue clonazepam Taking 1.5 mg HS. It has some mood stabilizing effects and she has restarted the lamotrigine 100 mg twice daily.  She agrees to continue these 2 medications.  She agrees. Disc risk more cycling without another stabilizer. Hydroxyzine trial 10-20 mg instead of trazodone Or samples Belsomra given    04/08/22 appt noted: Current psychiatry relevant meds include  Belsomra 20 mg nightly, clonazepam 0.5 mg tablets 3 nightly, lamotrigine 100 mg twice daily, trazodone 200 mg nightly, topiramate reduced to 50 mg daily Was doing pretty well until Oct which is hard bc anniversary of brother's death.   She asked about increasing lamotrigine back up.   Did ok with sleep awhile at 2 clonazepam at night but then as fall approached then needed to go back to 3 of 0.5 mg HS to sleep.  Hard time falling asleep and turning off thoughts. No mood swings.   Working PT job.  Doing some shift work. Plan: Contingue clonazepam Taking 1.5 mg HS. It has some mood stabilizing effects and she has restarted the lamotrigine 100 mg twice daily.  She agrees to continue these 2 medications.  She agrees. Disc risk more cycling without another stabilizer. Continue Belsomra 20 mg HS Ok increase lamotrigine as noted: Increase lamotrigine to 1 and 1/2 tablets twice daily for 2 weeks then 2 tablets twice daily.  08/11/22 appt noted: Struggling.  Grief worsened depression.  Gained wt.  Isolating more.  Family babies her. Still working.  Not sure what to do with life.  Worse since beginning of theyear.  Lost a lot of family last year.  Regrets including staying married too long in a bad marriage.   Don't sleep well or eat right. Work 2nd shift.  Don't sleep enough and 6 hours.  Uses sleep hygiene.   Some counseling 2022 when lost her brother.  Has done Hospice counseling 3 times. More down than anxious but is taking clonazepam 1.5 mg Hs. Plan: Contingue clonazepam Taking 1.5 mg HS. Disc risk more cycling without another stabilizer. Continue Belsomra 20 mg HS Start Wellbutrin for depression and increase to 300 mg AM  lamotrigine to 1 and 1/2 tablets twice daily for 2 weeks then 2 tablets twice daily.  10/13/22 appt noted: Dep with feelings worthlessness.  Still tries to socialize and get out with friends.  They recognize her dep. Wellb slowly increased BP.  Dr. Cliffton Asters increased BP  meds. Researching on internet.   Asks about stopping Wellbutrin and using Caplyta again.  She thinks it helped.   She's gained 8# since here.   Plan: Contingue clonazepam Taking 1.5 mg HS. Disc risk more cycling without another stabilizer. She agrees to restart Caplyta 42 mg daily. Continue Belsomra 20 mg HS Stop Wellbutrin continue lamotrigine 100 mg 2 tablets twice daily.  12/03/22 TC from pt with staff:  Kelli Morales's cost of Caplyta is $  638/month. She applied for Pt. Assistance but has been denied because she has insurance. I have talked with the program, and they indicated to me that anyone with insurance not matter the cost of the medication will not qualify for assistance. I asked if she was at the poverty level would they reconsider, and I was told no that would not matter.  MD resp: Thanks . Give her enough samples until appt. We can talk with rep and see if she can help. I have some stashed away, I can give her if needed to.   01/05/23 appt noted: "I'm not going to take Caplyta anymore".  Worries over the cost.  Can't handle that.   Hard to judge the effect on mood.  Not getting enough sleep.  Had to stop Belsomra since early June.   Caplyta doesn't seem to help sleep.  Mostly trouble staying asleep.  Clonazepam 1.5 mg HS doesnot help her stay asleep.   Avg 4-5 hours sleep.   Anxious about her money.  Semi-retired.   She thinks she made impulsive decision about stopping the Wellbutrin. Visited B and he noticed she is obsessive about wt and has tendency to make impulsive decisions.  Recognizes she is obsessive about wt concerns and it's been there a long tim.  Working on improving self. She senses more dep with sadness, crying, isolation.  Ended 2 rel with men without good reason.  Still goes to church.   Meds: clonazepam 1.5 mg HS and sometimes prn anxiety, no Wellbutrin, Caplyta 42 mg HS, lamotrigine 200 mg BID, no Belsomra DT cost, trazodone 200 mg HS,   01/26/23 TC: Franchot Erichsen,  [place]    01/27/23  5:54 PM Note Patient said she cannot afford Vraylar, is a tier 5. She reports Thorazine is not working, only getting about 5 hours of sleep a night and reporting weight gain. Reports she was on Belsomra previously and it wasn't effective. Denies late afternoon caffeine use, stops about 1:00. Has also been on trazodone and clonazepam for anxiety/insomnia. She is asking to go back on Wellbutrin. Said she talked you into taking her off of it. She said she didn't really give it a long enough time, but would like to try it again, thought her insurance covered 100% of the cost.  On 300 XL. Reporting depression at 6.5/10. Did not rate anxiety, but said she is anxious over the cost of meds and not being able to afford them due to the high tiers.     01/28/23 MD note: She complained of wt gain with Vraylar, Geodon, Wellbutrin (8#); none of which are associated with wt gain.  She is asking I stop Leafy Kindle which is both an antidepressant and a mood stabilizer and replace it with meds that is just an antidepressant.  She is only taking lamotrigine as a mood stabilizer at this point and is a very weak antimanic medicine.  Starting Wellbutrin without Vraylar at this time increases her risk of manic symptoms.  I do not like making these major med changes without seeing her.  Put her on the cancellation list and we can discuss the options when I see her. BTW, the cost of Leafy Kindle is solvable.  The reps will provide samples for her over a long period of time.  I think she should stay on Vraylar using samples until I see her and we can talk about it.     03/09/23 appt noted: Still taking Vraylar and it is doing good.  Mood  is better.  Main thing is stress. Mostly dealing with stress causing wt gain.   Lost 5# since failure.   Sleep is somewhat better. Seeing a judge as date and going well.  07/28/23 appt noted: Med: Clonazepam 0.5 mg tablet 3 times daily as needed anxiety or insomnia, lamotrigine 100 mg  tablets 2 tablets BID, topiramate 200 mg daily, trazodone 100 mg tablets 2 nightly as needed, off Vraylar 1.5 mg daily 3 weeks. Stopped Vraylar again bc wt up to 164#.  She felt the Leafy Kindle was the cause and in 2 weeks dropped 7#.  Given me a reason to start eating right and exercising.   Got hacked and was a big hassle but not a lot of money taken.  Was a hard time.  She has made security changes. She denies mania and depression.     Past Psychiatric Medication Trials: Vraylar 1.5 early weight gain.   Perphenazine 8 mg SE itching and wt, quetiapine 300 mg, Abilify 10,  Geodon 40 BID,  Risperidone, latuda 5# weight gain Caplyta cost problems Lithium,  Depakote, CBZ 300 sleepiness. Lamotrigine 300  Mirtazapine wt gain  Trazodone 200 NR, hydroxyzine NR, Belsomra 20 Clonazepam 1.5 mg HS paroxetine, Lexapro, sertraline, duloxetine 120,  Wellbutrin 300 NR  Psychiatric hospitalizations summer 2021  2021 summer she was psych hospitalized which was originally attributed to an intentional overdose which the patient strongly denies.  There is no evidence that she intentionally overdosed or had any suicidal intent.    Review of Systems:  Review of Systems  Constitutional:  Positive for unexpected weight change.  Cardiovascular:  Negative for palpitations.  Neurological:  Positive for headaches. Negative for dizziness, tremors and light-headedness.  Psychiatric/Behavioral:  Positive for sleep disturbance. Negative for agitation, decreased concentration and dysphoric mood. The patient is nervous/anxious.     Medications: I have reviewed the patient's current medications.  Current Outpatient Medications  Medication Sig Dispense Refill   albuterol (VENTOLIN HFA) 108 (90 Base) MCG/ACT inhaler      amLODipine (NORVASC) 10 MG tablet Take 1 tablet (10 mg total) by mouth daily. 30 tablet 0   Ascorbic Acid (VITAMIN C PO) Take 1 tablet by mouth daily.     CALCIUM PO Take 1 tablet by mouth daily.      cariprazine (VRAYLAR) 1.5 MG capsule Take 1.5 mg by mouth daily.     cholecalciferol (VITAMIN D) 1000 UNITS tablet Take 1,000 Units by mouth 2 (two) times daily.     clonazePAM (KLONOPIN) 0.5 MG tablet Take 1 tablet (0.5 mg total) by mouth 3 (three) times daily as needed for anxiety (or insomnia). 270 tablet 0   Cyanocobalamin (VITAMIN B 12 PO) Take by mouth.     denosumab (PROLIA) 60 MG/ML SOLN injection Inject 60 mg into the skin every 6 (six) months. Administer in upper arm, thigh, or abdomen     fish oil-omega-3 fatty acids 1000 MG capsule Take 1 g by mouth daily.     fluticasone (FLONASE) 50 MCG/ACT nasal spray Place 2 sprays into the nose daily.     fluticasone furoate-vilanterol (BREO ELLIPTA) 100-25 MCG/INH AEPB Inhale 1 puff into the lungs daily.     Glycerin-Hypromellose-PEG 400 ([employer] DRY EYE RELIEF OP) Apply 1 drop to eye at bedtime as needed (dry eyes).     hydrochlorothiazide (HYDRODIURIL) 25 MG tablet Take 25 mg by mouth daily.     IRON PO Take 1 tablet by mouth daily.     lamoTRIgine (LAMICTAL) 100 MG tablet  TAKE 2 TABLETS TWICE DAILY 360 tablet 0   magnesium gluconate (MAGONATE) 500 MG tablet Take 500 mg by mouth daily.     Multiple Vitamins-Minerals (COMPLETE MULTIVITAMIN/MINERAL PO) Take 1 tablet by mouth daily.     pantoprazole (PROTONIX) 40 MG tablet Take 40 mg by mouth daily.     tiZANidine (ZANAFLEX) 2 MG tablet Take 2 mg by mouth every 8 (eight) hours as needed.     topiramate (TOPAMAX) 200 MG tablet Take 1 tablet (200 mg total) by mouth daily. 90 tablet 1   traZODone (DESYREL) 100 MG tablet TAKE 2 TABLETS AT BEDTIME AS NEEDED FOR SLEEP 180 tablet 0   vitamin E 1000 UNIT capsule Take 1,000 Units by mouth daily.     vitamin k 100 MCG tablet Take 100 mcg by mouth daily.     No current facility-administered medications for this visit.    Medication Side Effects: None  Allergies:  Allergies  Allergen Reactions   Dilaudid [Hydromorphone Hcl] Other (See Comments)     Nightmares   Divalproex Sodium     Became comatose when she took Depakote and Geodon together.   Nsaids     "kidney problem"   Other Swelling    Nuts   Perphenazine-Amitriptyline Itching   Seroquel [Quetiapine Fumerate]     Past Medical History:  Diagnosis Date   Anxiety    Asthma    Bipolar disorder (HCC)    Cataracts, bilateral    Depression    GERD (gastroesophageal reflux disease)    Hydatid cyst    Left   Hypertension    Kidney problem    RT kidney is smaller -> decreased output   Migraine    Osteoporosis 11/2016   T score -2.8  improved from prior DEXA   Right rotator cuff tear    Vertigo     Family History  Problem Relation Age of Onset   Hypertension Mother    Heart disease Mother    Stroke Mother    Diabetes Father    Hypertension Father    Heart disease Father    Stroke Father    Cancer Father        Lymphoma   Prostate cancer Brother    Prostate cancer Brother    Heart disease Brother    Stroke Paternal Uncle     Social History   Socioeconomic History   Marital status: Divorced    Spouse name: Not on file   Number of children: 1   Years of education: MSN   Highest education level: Not on file  Occupational History   Not on file  Tobacco Use   Smoking status: Never   Smokeless tobacco: Never  Vaping Use   Vaping status: Never Used  Substance and Sexual Activity   Alcohol use: Yes    Alcohol/week: 0.0 standard drinks of alcohol    Comment: Rare   Drug use: No   Sexual activity: Not Currently    Birth control/protection: Post-menopausal    Comment: 1st intercourse 42 yo-2 partners  Other Topics Concern   Not on file  Social History Narrative   Lives with husband and daughter   Caffeine use: 2 cups per day   Right handed   Social Drivers of Health   Financial Resource Strain: Not on file  Food Insecurity: Not on file  Transportation Needs: Not on file  Physical Activity: Not on file  Stress: Not on file  Social Connections:  Not on file  Intimate  Partner Violence: Not on file    Past Medical History, Surgical history, Social history, and Family history were reviewed and updated as appropriate.   Please see review of systems for further details on the patient's review from today.   Objective:   Physical Exam:  There were no vitals taken for this visit.  Physical Exam Constitutional:      General: She is not in acute distress. Musculoskeletal:        General: No deformity.  Neurological:     Mental Status: She is alert and oriented to person, place, and time.     Coordination: Coordination normal.  Psychiatric:        Attention and Perception: Attention and perception normal. She does not perceive auditory or visual hallucinations.        Mood and Affect: Mood is anxious. Mood is not depressed. Affect is not labile, blunt, angry or inappropriate.        Speech: Speech normal.        Behavior: Behavior normal. Behavior is not slowed.        Thought Content: Thought content normal. Thought content is not paranoid or delusional. Thought content does not include homicidal or suicidal ideation. Thought content does not include suicidal plan.        Cognition and Memory: Cognition and memory normal.        Judgment: Judgment normal.     Comments: Insight intact Not suicidal.   Neat and pleasant No mania      Lab Review:     Component Value Date/Time   NA 137 01/02/2020 1122   K 3.8 01/02/2020 1122   CL 105 01/02/2020 1122   CO2 23 01/02/2020 1122   GLUCOSE 114 (H) 01/02/2020 1122   BUN 27 (H) 01/02/2020 1122   CREATININE 1.58 (H) 01/02/2020 1122   CALCIUM 9.3 01/02/2020 1122   PROT 8.4 (H) 01/02/2020 1122   ALBUMIN 5.0 01/02/2020 1122   AST 28 01/02/2020 1122   ALT 19 01/02/2020 1122   ALKPHOS 91 01/02/2020 1122   BILITOT 0.7 01/02/2020 1122   GFRNONAA 33 (L) 01/02/2020 1122   GFRAA 39 (L) 01/02/2020 1122       Component Value Date/Time   WBC 6.0 01/02/2020 1122   RBC 4.51 01/02/2020  1122   HGB 13.6 01/02/2020 1122   HCT 40.5 01/02/2020 1122   PLT 210 01/02/2020 1122   MCV 89.8 01/02/2020 1122   MCH 30.2 01/02/2020 1122   MCHC 33.6 01/02/2020 1122   RDW 11.9 01/02/2020 1122   LYMPHSABS 0.8 01/02/2020 1122   MONOABS 0.3 01/02/2020 1122   EOSABS 0.1 01/02/2020 1122   BASOSABS 0.0 01/02/2020 1122    No results found for: "POCLITH", "LITHIUM"   No results found for: "PHENYTOIN", "PHENOBARB", "VALPROATE", "CBMZ"   .res Assessment: Plan:    Jatziri was seen today for follow-up, depression, anxiety, sleeping problem and medication reaction.  Diagnoses and all orders for this visit:  Mixed bipolar II disorder with rapid cycling (HCC)  Generalized anxiety disorder  Insomnia due to mental condition  Migraine without aura and without status migrainosus, not intractable     30 min face to face time with patient was spent on counseling and coordination of care. We discussed Patient with a long history of bipolar disorder with rapid cycling that has been somewhat difficult to treat for a variety of reasons.  These include confounding migraine headaches which require a lot of medication and the fact that she is  medication sensitive as well as having failed multiple medications.  In addition she refuses meds that can cause weight gain which greatly limit options. This is chronic ongoing problem and won't stay on meds consistently. She was informed of high relapse risk without mood stabilizer. "I probably have an EDO", If I gain weight will have mood swings".  She is not currently confused.  She is not a acutely depressed but some chronic stressed.  She is not manic.  She has no psychosis.  There is no evidence of substance abuse. HA manageable.  We discussed the short-term risks associated with benzodiazepines including sedation and increased fall risk among others.  Discussed long-term side effect risk including dependence, potential withdrawal symptoms, and the  potential eventual dose-related risk of dementia.  But recent studies from 2020 dispute this association between benzodiazepines and dementia risk. Newer studies in 2020 do not support an association with dementia.  Disc this again with detail.  Disc try gradually reduce clonazepam.  Contingue clonazepam Taking 1.5 mg HS. Disc risk more cycling without another stabilizer.  continue lamotrigine 100 mg 2 tablets twice daily. Sleep better with mood stability from Vraylar but she agrees to restart Geodon and increase to 20 mg AM and 40 mg PM  FU 2  mos  Meredith Staggers MD, DFAPA  Please see After Visit Summary for patient specific instructions.  No future appointments.       No orders of the defined types were placed in this encounter.   -------------------------------

## 2023-08-03 ENCOUNTER — Other Ambulatory Visit: Payer: Self-pay | Admitting: Psychiatry

## 2023-08-03 DIAGNOSIS — F5105 Insomnia due to other mental disorder: Secondary | ICD-10-CM

## 2023-08-12 ENCOUNTER — Other Ambulatory Visit: Payer: Self-pay | Admitting: Psychiatry

## 2023-08-12 DIAGNOSIS — F3181 Bipolar II disorder: Secondary | ICD-10-CM

## 2023-08-17 DIAGNOSIS — N183 Chronic kidney disease, stage 3 unspecified: Secondary | ICD-10-CM | POA: Diagnosis not present

## 2023-08-17 DIAGNOSIS — F319 Bipolar disorder, unspecified: Secondary | ICD-10-CM | POA: Diagnosis not present

## 2023-08-17 DIAGNOSIS — Z0000 Encounter for general adult medical examination without abnormal findings: Secondary | ICD-10-CM | POA: Diagnosis not present

## 2023-08-17 DIAGNOSIS — J301 Allergic rhinitis due to pollen: Secondary | ICD-10-CM | POA: Diagnosis not present

## 2023-08-17 DIAGNOSIS — D509 Iron deficiency anemia, unspecified: Secondary | ICD-10-CM | POA: Diagnosis not present

## 2023-08-17 DIAGNOSIS — I129 Hypertensive chronic kidney disease with stage 1 through stage 4 chronic kidney disease, or unspecified chronic kidney disease: Secondary | ICD-10-CM | POA: Diagnosis not present

## 2023-08-17 DIAGNOSIS — Z79899 Other long term (current) drug therapy: Secondary | ICD-10-CM | POA: Diagnosis not present

## 2023-08-17 DIAGNOSIS — G43909 Migraine, unspecified, not intractable, without status migrainosus: Secondary | ICD-10-CM | POA: Diagnosis not present

## 2023-08-17 DIAGNOSIS — M810 Age-related osteoporosis without current pathological fracture: Secondary | ICD-10-CM | POA: Diagnosis not present

## 2023-08-17 DIAGNOSIS — F5101 Primary insomnia: Secondary | ICD-10-CM | POA: Diagnosis not present

## 2023-08-17 DIAGNOSIS — K219 Gastro-esophageal reflux disease without esophagitis: Secondary | ICD-10-CM | POA: Diagnosis not present

## 2023-09-01 ENCOUNTER — Telehealth: Payer: Self-pay | Admitting: Psychiatry

## 2023-09-01 NOTE — Telephone Encounter (Signed)
 Pt lvm that she is taking the geodine 1 in am and 2 in pm. She is still experiencing depression and she is in bed 2 days out of the week. She would like to know if her medication could be increased. Plerase call Kelli Morales at [phone]

## 2023-09-02 NOTE — Telephone Encounter (Addendum)
 Pt seen on 2/18:  Contingue clonazepam Taking 1.5 mg HS. Disc risk more cycling without another stabilizer.   continue lamotrigine 100 mg 2 tablets twice daily. Sleep better with mood stability from Vraylar but she agrees to restart Geodon and increase to 20 mg AM and 40 mg PM   She reports taking Geodon as prescribed and it is not helping with her depression. I asked her to rate it and she said it varies. She can't say it is a 10, but 7-8/10. At least one day a week she is staying in bed all day. Some days she will get up to shower but go back to bed and sleeps all day. Denied SI. The clonazepam is helping with anxiety. She takes magnesium and trazodone at night and is able to sleep thru the night. She reports having a housekeeper to help with household chores. She reports procrastinating because she doesn't feel like doing things. It is noted that she will refuse any medications that will cause wt gain.   She is taking: Clonazepam Lamictal 200 mg BID Topamax 200 mg  Trazodone 200 mg Geodon

## 2023-09-02 NOTE — Telephone Encounter (Signed)
 Put her on the cancellation list.  I do not have a lot of options available to me.  We have tried everything that does not have a risk of weight gain.  The Leafy Kindle was the best option she was getting benefit but she complained that it was causing weight gain though it does not generally do that. She can try increasing the Geodon to 40 mg twice a day but I do not know that if that will help with the depression or not.  She may have to agree to go back on something that helped her in the past like the Vraylar.  Otherwise she might have to just wait to her next appointment with me

## 2023-09-03 NOTE — Telephone Encounter (Signed)
 Recommendations reviewed with patient. She has appt 4/1 and elects to increase the Geodon to 40 mg BID. She said it works, but that she has times when her depression is worse. She said that might be the issue no matter what she is taking.

## 2023-09-03 NOTE — Telephone Encounter (Signed)
 Per Dr. [name] please put on CXL.

## 2023-09-03 NOTE — Telephone Encounter (Signed)
 LVM to [facility]

## 2023-09-07 DIAGNOSIS — M810 Age-related osteoporosis without current pathological fracture: Secondary | ICD-10-CM | POA: Diagnosis not present

## 2023-09-08 ENCOUNTER — Encounter: Payer: Self-pay | Admitting: Psychiatry

## 2023-09-08 ENCOUNTER — Ambulatory Visit: Admitting: Psychiatry

## 2023-09-08 DIAGNOSIS — F3181 Bipolar II disorder: Secondary | ICD-10-CM | POA: Diagnosis not present

## 2023-09-08 DIAGNOSIS — G43009 Migraine without aura, not intractable, without status migrainosus: Secondary | ICD-10-CM

## 2023-09-08 DIAGNOSIS — F411 Generalized anxiety disorder: Secondary | ICD-10-CM

## 2023-09-08 DIAGNOSIS — F5105 Insomnia due to other mental disorder: Secondary | ICD-10-CM | POA: Diagnosis not present

## 2023-09-08 NOTE — Progress Notes (Signed)
 Kelli Morales [id number] 10/15/1950 73 y.o.   Subjective:   Patient ID:  Kelli Morales is a 73 y.o. (DOB Aug 16, 1950) female.  Chief Complaint:  Chief Complaint  Patient presents with   Follow-up   Depression   Anxiety    [name] presents to the office today for follow-up of bipolar disorder.  hospitalization July 2021 for alleged intentional overdose of medications.  Discharged 01/02/2020 on clonazepam 0.5 mg 3 times daily as needed, lamotrigine 400 mg twice daily, topiramate 200 mg nightly, trazodone 300 mg nightly, and ziprasidone 40 mg twice daily, and zonisamide 300 mg daily.  12/01/2019 appointment notable for patient having seen significant mood stability and reduction of manic symptoms and depression with an increase in lamotrigine and the addition of Geodon 20 mg twice daily.  No meds were changed.  01/18/20 appt with the following noted: Disc recent hospitalization.  Said she felt weak the day of hospitalization and grief and got distraught.  She denied over taking meds and got pulled over by police for driving erratically.   She denies intentional overdose but she was dehydrated. Upset over being at behavioral health hosp of 2 days. Lamotrigine reduced to 1 daily. Reduced Zonesimide gradually per MD. Reduced topiramate to 100 mg daily. Says she ate normally that day.   Says she's always been sensitive to Geodon but is doing OK with it right now.  No other explanation for what happened that day except she was distraught and overwhelmed with mother's estate. Started grief therapy at Hospice. Plan no med changes as recently hospitalized  02/10/2020 appt with the following noted: CO weight gain and thinks it is Geodon.  Gained weight gain on Seroquel and hasn't lost it.  Wants to wean off clonazepam.  Has cut so many meds back dramatically. BP has come back to normal. I feel really good.  Calmer and more focused.  No brain fog.  No sadness.  Don't get as hyper.   Anxiety not out of control.  Better overall. No HA.  Meditation and therapy and it helped to wean off migraine meds.   Now more ready to let go of clonazepam.   Sleep variable.  Get wound up at work and can have a hard time sleeping but not too bad. Plan: Disc her desire to taper clonazepam.  Need to protect sleep.  Taking 1.5 mg HS. Reduce no faster than 0.25 mg reduction per month.   02/22/2020 phone call from patient complaining of 10 pound weight gain since January 04, 2020 when starting Geodon 40 mg twice daily. MD response:She has tried all of the mood stabilizers with the lowest weight gain risk.  She is not overweight.  But she is very focused on her weight.  The only alternative I see that she is not taking with low weight gain risk is haloperidol and typically ziprasidone or Geodon has even lower weight gain risk than haloperidol but there is individual variation. She had a psychiatric hospitalization on Geodon 20 mg twice daily so I would not be in favor of reducing the dose below Geodon 40 mg twice daily.  03/19/20 appt with following noted: Took herself off the Geodon bc jitters and weight gain. Restarted lamotrigine at 100 mg BID. Has gained from 128# to 155 # and she attributes to meds including quetiapine and perphenazine. Some anxiety.  Still on clonazepam 1.5 mg daily.  Occ trazodone prn. Patient reports stable mood and denies depressed or irritable moods.   Patient denies  difficulty with sleep initiation or maintenance. Denies appetite disturbance.  Patient reports that energy and motivation have been good.  Patient denies any difficulty with concentration.  Patient denies any suicidal ideation.  07/18/2020 appt with following noted: New job end of December.  Doing well with it. Agency directoer and DON. Ongoing problem with weight gain 14#/6 mos.  CO worse.  Doesn't know why.  On [employer], walking.  Taking clonazepam 1.5 mg HS. "Probably not enough sleep", but sleeps 8-4.  Same sleep  pattern for several years. No sig periods of hypomania except brief after mother's death. Patient reports stable mood and denies depressed or irritable moods.  Patient denies any recent difficulty with anxiety.  Patient denies difficulty with sleep initiation or maintenance. Denies appetite disturbance.  Patient reports that energy and motivation have been good.  Patient denies any difficulty with concentration.  Patient denies any suicidal ideation. Plan: Contingue clonazepam Taking 1.5 mg HS. It has some mood stabilizing effects and she has restarted the lamotrigine 100 mg twice daily.  She agrees to continue these 2 medications.  She agrees.  1/10/20023 appt noted: B and M passed away.  Called in May 11, 2024 crying and with insomnia. Rough time.  She had driven need to back track on knowing everything about what happened before brother died.  Did same thing after M died.  Couldn't let it go and obsessed on it. Tried Hospice without help. Poor sleep.  Initial with mind racing on deaths and what she needs to do. Taking klonopin 1.5 mg HS, trazodone 100 mg HS. Recognizes couldn't focus and memory issues after brother's death. Plan: Contingue clonazepam Taking 1.5 mg HS. It has some mood stabilizing effects and she has restarted the lamotrigine 100 mg twice daily.  She agrees to continue these 2 medications.  She agrees. Trial Caplyta 42 mg for bipolar depression and chronic insomnia.  06/21/2021 TC saying Caplyta was working.  07/04/2021 appt noted: The way my life is will be hard to not have some depression.  Not sadness.  Caplyta seems to work for sx so far and seems less down.  On it for 2 weeks.  Can't tell about weight gain yet. Exercising and diet healthier. Sleep is better with Caplyta.  Thinks it is helping so far. Not sure the cost yet.   Uncle died and going to MD.   Sx of loneliness but has friends and family.  Feels lonely even in big group.  Still dealing with grief with M and B.   Plan:  Contingue clonazepam Taking 1.5 mg HS. It has some mood stabilizing effects and she has restarted the lamotrigine 100 mg twice daily.  She agrees to continue these 2 medications.  She agrees. Continue Caplyta 42 mg for bipolar depression and chronic insomnia. Trazodone prn  08/14/2021 phone call saying she was no longer taking Caplyta because it was too expensive and also because she feared weight gain with it. 08/21/2021 phone call complaining of insomnia and wanting to increase trazodone.  Okay to increase trazodone to 200 mg nightly  11/27/2021 appointment with the following noted: Still dealing with HA.  Dr. Neale Burly, he increased to 200 mg daily. Lost 27# since 11-May-2024. B died in 2024-04-10 and started her on spiral down.  Other B dx prostate CA.  Worried over him.  I was such a wreck.  Nephew died of drugs at 22. Just back from  [place] a week ago. Trouble sleeping since 2024-04-10; initial and terminal.  To bed 8 and  up 430.  Falling asleep 10 and awake at 3.   Down to clonazepam 0.5 mg 2 at night.  Anxiety better with exercise.  Has dumped a couple of BF and niece said she's needing therapy.  Not sure why. Better with grief now than she was.  Not depressed.  Minimal manic sx Plan: Contingue clonazepam Taking 1.5 mg HS. It has some mood stabilizing effects and she has restarted the lamotrigine 100 mg twice daily.  She agrees to continue these 2 medications.  She agrees. Disc risk more cycling without another stabilizer. Hydroxyzine trial 10-20 mg instead of trazodone Or samples Belsomra given    04/08/22 appt noted: Current psychiatry relevant meds include Belsomra 20 mg nightly, clonazepam 0.5 mg tablets 3 nightly, lamotrigine 100 mg twice daily, trazodone 200 mg nightly, topiramate reduced to 50 mg daily Was doing pretty well until Oct which is hard bc anniversary of brother's death.   She asked about increasing lamotrigine back up.   Did ok with sleep awhile at 2 clonazepam at night but then as  fall approached then needed to go back to 3 of 0.5 mg HS to sleep.  Hard time falling asleep and turning off thoughts. No mood swings.   Working PT job.  Doing some shift work. Plan: Contingue clonazepam Taking 1.5 mg HS. It has some mood stabilizing effects and she has restarted the lamotrigine 100 mg twice daily.  She agrees to continue these 2 medications.  She agrees. Disc risk more cycling without another stabilizer. Continue Belsomra 20 mg HS Ok increase lamotrigine as noted: Increase lamotrigine to 1 and 1/2 tablets twice daily for 2 weeks then 2 tablets twice daily.  08/11/22 appt noted: Struggling.  Grief worsened depression.  Gained wt.  Isolating more.  Family babies her. Still working.  Not sure what to do with life.  Worse since beginning of theyear.  Lost a lot of family last year.  Regrets including staying married too long in a bad marriage.   Don't sleep well or eat right. Work 2nd shift.  Don't sleep enough and 6 hours.  Uses sleep hygiene.   Some counseling 2022 when lost her brother.  Has done Hospice counseling 3 times. More down than anxious but is taking clonazepam 1.5 mg Hs. Plan: Contingue clonazepam Taking 1.5 mg HS. Disc risk more cycling without another stabilizer. Continue Belsomra 20 mg HS Start Wellbutrin for depression and increase to 300 mg AM  lamotrigine to 1 and 1/2 tablets twice daily for 2 weeks then 2 tablets twice daily.  10/13/22 appt noted: Dep with feelings worthlessness.  Still tries to socialize and get out with friends.  They recognize her dep. Wellb slowly increased BP.  Dr. Cliffton Asters increased BP meds. Researching on internet.   Asks about stopping Wellbutrin and using Caplyta again.  She thinks it helped.   She's gained 8# since here.   Plan: Contingue clonazepam Taking 1.5 mg HS. Disc risk more cycling without another stabilizer. She agrees to restart Caplyta 42 mg daily. Continue Belsomra 20 mg HS Stop Wellbutrin continue lamotrigine 100 mg  2 tablets twice daily.  12/03/22 TC from pt with staff:  Lynetta's cost of Caplyta is $638/month. She applied for Pt. Assistance but has been denied because she has insurance. I have talked with the program, and they indicated to me that anyone with insurance not matter the cost of the medication will not qualify for assistance. I asked if she was at the poverty level would they  reconsider, and I was told no that would not matter.  MD resp: Thanks . Give her enough samples until appt. We can talk with rep and see if she can help. I have some stashed away, I can give her if needed to.   01/05/23 appt noted: "I'm not going to take Caplyta anymore".  Worries over the cost.  Can't handle that.   Hard to judge the effect on mood.  Not getting enough sleep.  Had to stop Belsomra since early June.   Caplyta doesn't seem to help sleep.  Mostly trouble staying asleep.  Clonazepam 1.5 mg HS doesnot help her stay asleep.   Avg 4-5 hours sleep.   Anxious about her money.  Semi-retired.   She thinks she made impulsive decision about stopping the Wellbutrin. Visited B and he noticed she is obsessive about wt and has tendency to make impulsive decisions.  Recognizes she is obsessive about wt concerns and it's been there a long tim.  Working on improving self. She senses more dep with sadness, crying, isolation.  Ended 2 rel with men without good reason.  Still goes to church.   Meds: clonazepam 1.5 mg HS and sometimes prn anxiety, no Wellbutrin, Caplyta 42 mg HS, lamotrigine 200 mg BID, no Belsomra DT cost, trazodone 200 mg HS,   01/26/23 TC: Franchot Erichsen, [place]    01/27/23  5:54 PM Note Patient said she cannot afford Vraylar, is a tier 5. She reports Thorazine is not working, only getting about 5 hours of sleep a night and reporting weight gain. Reports she was on Belsomra previously and it wasn't effective. Denies late afternoon caffeine use, stops about 1:00. Has also been on trazodone and clonazepam for  anxiety/insomnia. She is asking to go back on Wellbutrin. Said she talked you into taking her off of it. She said she didn't really give it a long enough time, but would like to try it again, thought her insurance covered 100% of the cost.  On 300 XL. Reporting depression at 6.5/10. Did not rate anxiety, but said she is anxious over the cost of meds and not being able to afford them due to the high tiers.     01/28/23 MD note: She complained of wt gain with Vraylar, Geodon, Wellbutrin (8#); none of which are associated with wt gain.  She is asking I stop Leafy Kindle which is both an antidepressant and a mood stabilizer and replace it with meds that is just an antidepressant.  She is only taking lamotrigine as a mood stabilizer at this point and is a very weak antimanic medicine.  Starting Wellbutrin without Vraylar at this time increases her risk of manic symptoms.  I do not like making these major med changes without seeing her.  Put her on the cancellation list and we can discuss the options when I see her. BTW, the cost of Leafy Kindle is solvable.  The reps will provide samples for her over a long period of time.  I think she should stay on Vraylar using samples until I see her and we can talk about it.     03/09/23 appt noted: Still taking Vraylar and it is doing good.  Mood is better.  Main thing is stress. Mostly dealing with stress causing wt gain.   Lost 5# since failure.   Sleep is somewhat better. Seeing a judge as date and going well.  07/28/23 appt noted: Med: Clonazepam 0.5 mg tablet 3 times daily as needed anxiety or insomnia, lamotrigine  100 mg tablets 2 tablets BID, topiramate 200 mg daily, trazodone 100 mg tablets 2 nightly as needed, off Vraylar 1.5 mg daily 3 weeks. Stopped Vraylar again bc wt up to 164#.  She felt the Leafy Kindle was the cause and in 2 weeks dropped 7#.  Given me a reason to start eating right and exercising.   Got hacked and was a big hassle but not a lot of money taken.  Was  a hard time.  She has made security changes. She denies mania and depression.    09/01/23 TC: She reports taking Geodon as prescribed and it is not helping with her depression. I asked her to rate it and she said it varies. She can't say it is a 10, but 7-8/10. At least one day a week she is staying in bed all day. Some days she will get up to shower but go back to bed and sleeps all day. Denied SI. The clonazepam is helping with anxiety. She takes magnesium and trazodone at night and is able to sleep thru the night. She reports having a housekeeper to help with household chores. She reports procrastinating because she doesn't feel like doing things. It is noted that she will refuse any medications that will cause wt gain.   She is taking: Clonazepam Lamictal 200 mg BID Topamax 200 mg  Trazodone 200 mg Geodon   MD resp:  Put her on the cancellation list.  I do not have a lot of options available to me.  We have tried everything that does not have a risk of weight gain.  The Leafy Kindle was the best option she was getting benefit but she complained that it was causing weight gain though it does not generally do that. She can try increasing the Geodon to 40 mg twice a day but I do not know that if that will help with the depression or not.  She may have to agree to go back on something that helped her in the past like the Vraylar.  Otherwise she might have to just wait to her next appointment with me     09/08/23 urgent appt from waiting list: Been more dep.  Also grief with close cousin with terminal CA and passed.  Don't handle grief well since losing mother.  It wasn't like my normal dep. Before he died mild manic buying excessively and then dep with above trigger. Increase in Geodon is helping over the last few days. Med : Geodon 40 mg BID, others the same. No SE with Geodon.   No reduced appetite.   Sleep is good 8 hours.  When grieving was just staying in bed but not sleeping.   Fasting till  lunch for [place].     Past Psychiatric Medication Trials: Vraylar 1.5 early weight gain.   Perphenazine 8 mg SE itching and wt, quetiapine 300 mg, Abilify 10,   Geodon 40 BID,   Risperidone, latuda 5# weight gain Caplyta cost problems Lithium,  Depakote, CBZ 300 sleepiness. Lamotrigine 300  Mirtazapine wt gain  Trazodone 200 NR, hydroxyzine NR, Belsomra 20 Clonazepam 1.5 mg HS paroxetine, Lexapro, sertraline, duloxetine 120,  Wellbutrin 300 NR  Psychiatric hospitalizations summer 2021  2021 summer she was psych hospitalized which was originally attributed to an intentional overdose which the patient strongly denies.  There is no evidence that she intentionally overdosed or had any suicidal intent.    Review of Systems:  Review of Systems  Constitutional:  Positive for unexpected weight change.  Cardiovascular:  Negative for palpitations.  Neurological:  Positive for headaches. Negative for dizziness, tremors, weakness and light-headedness.  Psychiatric/Behavioral:  Positive for depression, dysphoric mood and sleep disturbance. Negative for agitation and decreased concentration. The patient is nervous/anxious.     Medications: I have reviewed the patient's current medications.  Current Outpatient Medications  Medication Sig Dispense Refill   albuterol (VENTOLIN HFA) 108 (90 Base) MCG/ACT inhaler      amLODipine (NORVASC) 10 MG tablet Take 1 tablet (10 mg total) by mouth daily. 30 tablet 0   Ascorbic Acid (VITAMIN C PO) Take 1 tablet by mouth daily.     CALCIUM PO Take 1 tablet by mouth daily.     cholecalciferol (VITAMIN D) 1000 UNITS tablet Take 1,000 Units by mouth 2 (two) times daily.     clonazePAM (KLONOPIN) 0.5 MG tablet Take 1 tablet (0.5 mg total) by mouth 3 (three) times daily as needed for anxiety (or insomnia). 270 tablet 0   Cyanocobalamin (VITAMIN B 12 PO) Take by mouth.     denosumab (PROLIA) 60 MG/ML SOLN injection Inject 60 mg into the skin every 6 (six) months.  Administer in upper arm, thigh, or abdomen     fish oil-omega-3 fatty acids 1000 MG capsule Take 1 g by mouth daily.     fluticasone (FLONASE) 50 MCG/ACT nasal spray Place 2 sprays into the nose daily.     fluticasone furoate-vilanterol (BREO ELLIPTA) 100-25 MCG/INH AEPB Inhale 1 puff into the lungs daily.     Glycerin-Hypromellose-PEG 400 ([employer] DRY EYE RELIEF OP) Apply 1 drop to eye at bedtime as needed (dry eyes).     hydrochlorothiazide (HYDRODIURIL) 25 MG tablet Take 25 mg by mouth daily.     IRON PO Take 1 tablet by mouth daily.     lamoTRIgine (LAMICTAL) 100 MG tablet TAKE 2 TABLETS TWICE DAILY 360 tablet 0   magnesium gluconate (MAGONATE) 500 MG tablet Take 500 mg by mouth daily.     Multiple Vitamins-Minerals (COMPLETE MULTIVITAMIN/MINERAL PO) Take 1 tablet by mouth daily.     pantoprazole (PROTONIX) 40 MG tablet Take 40 mg by mouth daily.     tiZANidine (ZANAFLEX) 2 MG tablet Take 2 mg by mouth every 8 (eight) hours as needed.     topiramate (TOPAMAX) 200 MG tablet Take 1 tablet (200 mg total) by mouth daily. 90 tablet 1   traZODone (DESYREL) 100 MG tablet TAKE 2 TABLETS AT BEDTIME AS NEEDED FOR SLEEP 180 tablet 0   vitamin E 1000 UNIT capsule Take 1,000 Units by mouth daily.     vitamin k 100 MCG tablet Take 100 mcg by mouth daily.     ziprasidone (GEODON) 20 MG capsule 1 capsule in the morning and Evening for 1 week, then 1 in the morning and 2 capsules in evening 270 capsule 1   No current facility-administered medications for this visit.    Medication Side Effects: None  Allergies:  Allergies  Allergen Reactions   Dilaudid [Hydromorphone Hcl] Other (See Comments)    Nightmares   Divalproex Sodium     Became comatose when she took Depakote and Geodon together.   Nsaids     "kidney problem"   Other Swelling    Nuts   Perphenazine-Amitriptyline Itching   Seroquel [Quetiapine Fumerate]     Past Medical History:  Diagnosis Date   Anxiety    Asthma    Bipolar disorder  (HCC)    Cataracts, bilateral    Depression    GERD (gastroesophageal  reflux disease)    Hydatid cyst    Left   Hypertension    Kidney problem    RT kidney is smaller -> decreased output   Migraine    Osteoporosis 11/2016   T score -2.8  improved from prior DEXA   Right rotator cuff tear    Vertigo     Family History  Problem Relation Age of Onset   Hypertension Mother    Heart disease Mother    Stroke Mother    Diabetes Father    Hypertension Father    Heart disease Father    Stroke Father    Cancer Father        Lymphoma   Prostate cancer Brother    Prostate cancer Brother    Heart disease Brother    Stroke Paternal Uncle     Social History   Socioeconomic History   Marital status: Divorced    Spouse name: Not on file   Number of children: 1   Years of education: MSN   Highest education level: Not on file  Occupational History   Not on file  Tobacco Use   Smoking status: Never   Smokeless tobacco: Never  Vaping Use   Vaping status: Never Used  Substance and Sexual Activity   Alcohol use: Yes    Alcohol/week: 0.0 standard drinks of alcohol    Comment: Rare   Drug use: No   Sexual activity: Not Currently    Birth control/protection: Post-menopausal    Comment: 1st intercourse 59 yo-2 partners  Other Topics Concern   Not on file  Social History Narrative   Lives with husband and daughter   Caffeine use: 2 cups per day   Right handed   Social Drivers of Corporate investment banker Strain: Not on file  Food Insecurity: Not on file  Transportation Needs: Not on file  Physical Activity: Not on file  Stress: Not on file  Social Connections: Not on file  Intimate Partner Violence: Not on file    Past Medical History, Surgical history, Social history, and Family history were reviewed and updated as appropriate.   Please see review of systems for further details on the patient's review from today.   Objective:   Physical Exam:  There were no  vitals taken for this visit.  Physical Exam Constitutional:      General: She is not in acute distress. Musculoskeletal:        General: No deformity.  Neurological:     Mental Status: She is alert and oriented to person, place, and time.     Coordination: Coordination normal.  Psychiatric:        Attention and Perception: Attention and perception normal. She does not perceive auditory or visual hallucinations.        Mood and Affect: Mood is anxious and depressed. Affect is not labile, blunt, angry or inappropriate.        Speech: Speech normal.        Behavior: Behavior normal. Behavior is not slowed.        Thought Content: Thought content normal. Thought content is not paranoid or delusional. Thought content does not include homicidal or suicidal ideation. Thought content does not include suicidal plan.        Cognition and Memory: Cognition and memory normal.        Judgment: Judgment normal.     Comments: Insight intact Not suicidal.   Neat and pleasant No mania  Lab Review:     Component Value Date/Time   NA 137 01/02/2020 1122   K 3.8 01/02/2020 1122   CL 105 01/02/2020 1122   CO2 23 01/02/2020 1122   GLUCOSE 114 (H) 01/02/2020 1122   BUN 27 (H) 01/02/2020 1122   CREATININE 1.58 (H) 01/02/2020 1122   CALCIUM 9.3 01/02/2020 1122   PROT 8.4 (H) 01/02/2020 1122   ALBUMIN 5.0 01/02/2020 1122   AST 28 01/02/2020 1122   ALT 19 01/02/2020 1122   ALKPHOS 91 01/02/2020 1122   BILITOT 0.7 01/02/2020 1122   GFRNONAA 33 (L) 01/02/2020 1122   GFRAA 39 (L) 01/02/2020 1122       Component Value Date/Time   WBC 6.0 01/02/2020 1122   RBC 4.51 01/02/2020 1122   HGB 13.6 01/02/2020 1122   HCT 40.5 01/02/2020 1122   PLT 210 01/02/2020 1122   MCV 89.8 01/02/2020 1122   MCH 30.2 01/02/2020 1122   MCHC 33.6 01/02/2020 1122   RDW 11.9 01/02/2020 1122   LYMPHSABS 0.8 01/02/2020 1122   MONOABS 0.3 01/02/2020 1122   EOSABS 0.1 01/02/2020 1122   BASOSABS 0.0 01/02/2020  1122    No results found for: "POCLITH", "LITHIUM"   No results found for: "PHENYTOIN", "PHENOBARB", "VALPROATE", "CBMZ"   .res Assessment: Plan:    Aleanna was seen today for follow-up, depression and anxiety.  Diagnoses and all orders for this visit:  Mixed bipolar II disorder with rapid cycling (HCC)  Generalized anxiety disorder  Insomnia due to mental condition  Migraine without aura and without status migrainosus, not intractable    30 min face to face time with patient was spent on counseling and coordination of care. We discussed Patient with a long history of bipolar disorder with rapid cycling that has been somewhat difficult to treat for a variety of reasons.  These include confounding migraine headaches which require a lot of medication and the fact that she is medication sensitive as well as having failed multiple medications.  In addition she refuses meds that can cause weight gain which greatly limit options. This is chronic ongoing problem and won't stay on meds consistently. She was informed of high relapse risk without mood stabilizer.  She had recent increase in dep but not severe. She is not currently confused.  She is not manic.  She has no psychosis.  There is no evidence of substance abuse. HA manageable.  We discussed the short-term risks associated with benzodiazepines including sedation and increased fall risk among others.  Discussed long-term side effect risk including dependence, potential withdrawal symptoms, and the potential eventual dose-related risk of dementia.  But recent studies from 2020 dispute this association between benzodiazepines and dementia risk. Newer studies in 2020 do not support an association with dementia.  Disc this again with detail.  Disc try gradually reduce clonazepam.  "I probably have an EDO", If I gain weight will have mood swings"..  no interest in therapy for this.  Contingue clonazepam Taking 1.5 mg HS. Disc risk more  cycling without another stabilizer.  continue lamotrigine 100 mg 2 tablets twice daily. continue Geodon increase 40 mg AM and 40 mg PM for a longer trial She agrees.  FU 2  mos  Meredith Staggers MD, DFAPA  Please see After Visit Summary for patient specific instructions.  Future Appointments  Date Time Provider Department Center  10/19/2023 11:00 AM Cottle, Steva Ready., MD [facility] None         No orders of the  defined types were placed in this encounter.   -------------------------------

## 2023-09-15 ENCOUNTER — Other Ambulatory Visit: Payer: Self-pay | Admitting: Psychiatry

## 2023-09-15 DIAGNOSIS — F411 Generalized anxiety disorder: Secondary | ICD-10-CM

## 2023-09-15 DIAGNOSIS — F3181 Bipolar II disorder: Secondary | ICD-10-CM

## 2023-09-15 DIAGNOSIS — F5105 Insomnia due to other mental disorder: Secondary | ICD-10-CM

## 2023-09-16 ENCOUNTER — Other Ambulatory Visit: Payer: Self-pay | Admitting: Psychiatry

## 2023-09-16 DIAGNOSIS — F411 Generalized anxiety disorder: Secondary | ICD-10-CM

## 2023-09-16 DIAGNOSIS — F3181 Bipolar II disorder: Secondary | ICD-10-CM

## 2023-09-16 DIAGNOSIS — F5105 Insomnia due to other mental disorder: Secondary | ICD-10-CM

## 2023-09-16 MED ORDER — CLONAZEPAM 0.5 MG PO TABS: 0.5000 mg | ORAL_TABLET | Freq: Three times a day (TID) | ORAL | 2 refills | Status: DC | PRN

## 2023-09-16 NOTE — Telephone Encounter (Signed)
 Too early

## 2023-09-18 ENCOUNTER — Telehealth: Payer: Self-pay | Admitting: Psychiatry

## 2023-09-18 NOTE — Telephone Encounter (Signed)
 Pt LVM @ 3:59p stating that she and Dr [name] discussed her increasing Geodon to 3 pills at night and she plans to start that tonight.  She said she will advise [place] how it's going, but she wanted him to be aware.  Next appt 5/12

## 2023-09-21 NOTE — Telephone Encounter (Signed)
 Just FYI. Pt said per 4/1 visit she has increased the Geodon. She is reporting she thinks it will work well for her, though hasn't been but a couple of days.

## 2023-09-21 NOTE — Telephone Encounter (Signed)
 Apparently she is taking 3 of the ziprasidone 40 mg capsules.  ok

## 2023-10-09 ENCOUNTER — Telehealth: Payer: Self-pay | Admitting: Psychiatry

## 2023-10-09 ENCOUNTER — Other Ambulatory Visit: Payer: Self-pay

## 2023-10-09 DIAGNOSIS — F3181 Bipolar II disorder: Secondary | ICD-10-CM

## 2023-10-09 MED ORDER — ZIPRASIDONE HCL 20 MG PO CAPS: ORAL_CAPSULE | ORAL | 0 refills | Status: DC

## 2023-10-09 NOTE — Telephone Encounter (Addendum)
 Rx sent. Called her and she reports taking 20 mg capsules, 2 QAM and 3 QPM.

## 2023-10-09 NOTE — Telephone Encounter (Signed)
 Patient called in for refill on Geodon  20mg . States that her dosage was increased and it threw off her count and she has run out. Ph: [phone] Ph: [phone] Appt 5/12 Pharmacy [facility] [id number] [place], [place]

## 2023-10-12 ENCOUNTER — Other Ambulatory Visit: Payer: Self-pay | Admitting: Psychiatry

## 2023-10-12 DIAGNOSIS — F5105 Insomnia due to other mental disorder: Secondary | ICD-10-CM

## 2023-10-16 ENCOUNTER — Other Ambulatory Visit: Payer: Self-pay | Admitting: Psychiatry

## 2023-10-16 DIAGNOSIS — F5105 Insomnia due to other mental disorder: Secondary | ICD-10-CM

## 2023-10-18 ENCOUNTER — Other Ambulatory Visit: Payer: Self-pay | Admitting: Psychiatry

## 2023-10-18 DIAGNOSIS — F3181 Bipolar II disorder: Secondary | ICD-10-CM

## 2023-10-19 ENCOUNTER — Ambulatory Visit: Payer: Medicare HMO | Admitting: Psychiatry

## 2023-10-19 NOTE — Telephone Encounter (Signed)
 Has appt today

## 2023-11-07 ENCOUNTER — Other Ambulatory Visit: Payer: Self-pay | Admitting: Psychiatry

## 2023-11-07 DIAGNOSIS — F3181 Bipolar II disorder: Secondary | ICD-10-CM

## 2023-12-04 DIAGNOSIS — D124 Benign neoplasm of descending colon: Secondary | ICD-10-CM | POA: Diagnosis not present

## 2023-12-04 DIAGNOSIS — D123 Benign neoplasm of transverse colon: Secondary | ICD-10-CM | POA: Diagnosis not present

## 2023-12-04 DIAGNOSIS — Z1211 Encounter for screening for malignant neoplasm of colon: Secondary | ICD-10-CM | POA: Diagnosis not present

## 2023-12-04 DIAGNOSIS — K5730 Diverticulosis of large intestine without perforation or abscess without bleeding: Secondary | ICD-10-CM | POA: Diagnosis not present

## 2023-12-04 DIAGNOSIS — K648 Other hemorrhoids: Secondary | ICD-10-CM | POA: Diagnosis not present

## 2023-12-04 DIAGNOSIS — K635 Polyp of colon: Secondary | ICD-10-CM | POA: Diagnosis not present

## 2023-12-07 ENCOUNTER — Other Ambulatory Visit: Payer: Self-pay | Admitting: Psychiatry

## 2023-12-07 DIAGNOSIS — F3181 Bipolar II disorder: Secondary | ICD-10-CM

## 2023-12-07 DIAGNOSIS — F5105 Insomnia due to other mental disorder: Secondary | ICD-10-CM

## 2023-12-07 DIAGNOSIS — F411 Generalized anxiety disorder: Secondary | ICD-10-CM

## 2023-12-08 DIAGNOSIS — K635 Polyp of colon: Secondary | ICD-10-CM | POA: Diagnosis not present

## 2023-12-08 DIAGNOSIS — D124 Benign neoplasm of descending colon: Secondary | ICD-10-CM | POA: Diagnosis not present

## 2023-12-08 DIAGNOSIS — D123 Benign neoplasm of transverse colon: Secondary | ICD-10-CM | POA: Diagnosis not present

## 2023-12-21 ENCOUNTER — Encounter: Payer: Self-pay | Admitting: Nurse Practitioner

## 2023-12-21 ENCOUNTER — Ambulatory Visit (INDEPENDENT_AMBULATORY_CARE_PROVIDER_SITE_OTHER): Payer: Self-pay | Admitting: Nurse Practitioner

## 2023-12-21 VITALS — BP 118/78 | HR 62 | Ht 63.5 in | Wt 162.2 lb

## 2023-12-21 DIAGNOSIS — Z01419 Encounter for gynecological examination (general) (routine) without abnormal findings: Secondary | ICD-10-CM | POA: Diagnosis not present

## 2023-12-21 DIAGNOSIS — M810 Age-related osteoporosis without current pathological fracture: Secondary | ICD-10-CM

## 2023-12-21 DIAGNOSIS — Z780 Asymptomatic menopausal state: Secondary | ICD-10-CM

## 2023-12-21 NOTE — Progress Notes (Addendum)
 Kelli Morales [birth year] [id number]   History:  73 y.o. G2P0011 presents for breast and pelvic exam. No GYN complaints. Postmenopausal - no HRT, no bleeding. Normal pap and mammogram history. On Prolia  for osteoporosis, stable, managed by PCP. Bipolar disorder and anxiety managed by psych. New relationship. Not sexually active yet but would like recommendations on lubricants.   Gynecologic History No LMP recorded. Patient is postmenopausal.   Contraception: post menopausal status Sexually active: No  Health Maintenance Last Pap: 10/08/2021. Results were: Normal Last mammogram: 2024 per patient. Results were: Normal Last colonoscopy: 12/04/2023. Results: Polyps, 3-year recall Last Dexa: 2024 per patient. Results were: stable per patient  Past medical history, past surgical history, family history and social history were all reviewed and documented in the EPIC chart. 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   ROS:  A ROS was performed and pertinent positives and negatives are included.  Exam:  Vitals:   12/21/23 1515  BP: 118/78  Pulse: 62  SpO2: 98%  Weight: 162 lb 3.2 oz (73.6 kg)  Height: 5' 3.5 (1.613 m)     Body mass index is 28.28 kg/m.  General appearance:  Normal Thyroid :  Symmetrical, normal in size, without palpable masses or nodularity. Respiratory  Auscultation:  Clear without wheezing or rhonchi Cardiovascular  Auscultation:  Regular rate, without rubs, murmurs or gallops  Edema/varicosities:  Not grossly evident Abdominal  Soft,nontender, without masses, guarding or rebound.  Liver/spleen:  No organomegaly noted  Hernia:  None appreciated  Skin  Inspection:  Grossly normal Breasts: Examined lying and sitting.   Right: Without masses, retractions, nipple discharge or axillary adenopathy.   Left: Without masses, retractions, nipple discharge or axillary adenopathy. Pelvic: External genitalia:  no lesions              Urethra:  normal  appearing urethra with no masses, tenderness or lesions              Bartholins and Skenes: normal                 Vagina: normal appearing vagina with normal color and discharge, no lesions. Atrophic changes              Cervix: no lesions Bimanual Exam:  Uterus:  no masses or tenderness              Adnexa: no mass, fullness, tenderness              Rectovaginal: Deferred              Anus:  normal, no lesions  Kelli Morales, CMA present as chaperone.   Assessment/Plan:  73 y.o. G2P0011 for breast and pelvic exam.   Encounter for breast and pelvic examination - Education provided on SBEs, importance of preventative screenings, current guidelines, high calcium diet, regular exercise, and multivitamin daily. Labs with PCP. UberLube samples provided.   Postmenopausal - No HRT, no bleeding.   Age-related osteoporosis without current pathological fracture - managed by PCP. On prolia . She is very active in yoga, pilates, and weight lifting.   Screening for cervical cancer - Normal Pap history.  Discussed current guidelines and the option to stop screening. She had a friend pass from cervical cancer and would like to continue. Will repeat at 3-year interval.   Screening for breast cancer - Normal mammogram history. Continue annual screenings. Normal breast exam today.  Screening for colon cancer - 2025 colonoscopy. Will repeat at 3-year interval  per GI recommendation.   Return in about 2 years (around 12/20/2025) for B&P.     Kelli DELENA Shutter DNP, 3:46 PM 12/21/2023

## 2023-12-26 ENCOUNTER — Other Ambulatory Visit: Payer: Self-pay | Admitting: Psychiatry

## 2023-12-26 DIAGNOSIS — F411 Generalized anxiety disorder: Secondary | ICD-10-CM

## 2023-12-26 DIAGNOSIS — F3181 Bipolar II disorder: Secondary | ICD-10-CM

## 2023-12-27 NOTE — Telephone Encounter (Signed)
Has appt 7/21 

## 2023-12-28 ENCOUNTER — Ambulatory Visit (INDEPENDENT_AMBULATORY_CARE_PROVIDER_SITE_OTHER): Admitting: Psychiatry

## 2023-12-28 ENCOUNTER — Encounter: Payer: Self-pay | Admitting: Psychiatry

## 2023-12-28 DIAGNOSIS — F5105 Insomnia due to other mental disorder: Secondary | ICD-10-CM

## 2023-12-28 DIAGNOSIS — F3181 Bipolar II disorder: Secondary | ICD-10-CM | POA: Diagnosis not present

## 2023-12-28 DIAGNOSIS — G43009 Migraine without aura, not intractable, without status migrainosus: Secondary | ICD-10-CM

## 2023-12-28 DIAGNOSIS — F411 Generalized anxiety disorder: Secondary | ICD-10-CM | POA: Diagnosis not present

## 2023-12-28 DIAGNOSIS — F4321 Adjustment disorder with depressed mood: Secondary | ICD-10-CM

## 2023-12-28 NOTE — Progress Notes (Unsigned)
 Kelli Morales [id number] 1951-01-17 73 y.o.   Subjective:   Patient ID:  Kelli Morales is a 73 y.o. (DOB 07-18-1950) female.  Chief Complaint:  Chief Complaint  Patient presents with  . Follow-up  . Manic Behavior  . Depression  . Sleeping Problem    Kelli Morales presents to the office today for follow-up of bipolar disorder.  hospitalization July 2021 for alleged intentional overdose of medications.  Discharged 01/02/2020 on clonazepam  0.5 mg 3 times daily as needed, lamotrigine  400 mg twice daily, topiramate  200 mg nightly, trazodone  300 mg nightly, and ziprasidone  40 mg twice daily, and zonisamide  300 mg daily.  12/01/2019 appointment notable for patient having seen significant mood stability and reduction of manic symptoms and depression with an increase in lamotrigine  and the addition of Geodon  20 mg twice daily.  No meds were changed.  01/18/20 appt with the following noted: Disc recent hospitalization.  Said she felt weak the day of hospitalization and grief and got distraught.  She denied over taking meds and got pulled over by police for driving erratically.   She denies intentional overdose but she was dehydrated. Upset over being at behavioral health hosp of 2 days. Lamotrigine  reduced to 1 daily. Reduced Zonesimide gradually per MD. Reduced topiramate  to 100 mg daily. Says she ate normally that day.   Says she's always been sensitive to Geodon  but is doing OK with it right now.  No other explanation for what happened that day except she was distraught and overwhelmed with mother's estate. Started grief therapy at Hospice. Plan no med changes as recently hospitalized  02/10/2020 appt with the following noted: CO weight gain and thinks it is Geodon .  Gained weight gain on Seroquel and hasn't lost it.  Wants to wean off clonazepam .  Has cut so many meds back dramatically. BP has come back to normal. I feel really good.  Calmer and more focused.  No brain fog.  No  sadness.  Don't get as hyper.  Anxiety not out of control.  Better overall. No HA.  Meditation and therapy and it helped to wean off migraine meds.   Now more ready to let go of clonazepam .   Sleep variable.  Get wound up at work and can have a hard time sleeping but not too bad. Plan: Disc her desire to taper clonazepam .  Need to protect sleep.  Taking 1.5 mg HS. Reduce no faster than 0.25 mg reduction per month.   02/22/2020 phone call from patient complaining of 10 pound weight gain since January 04, 2020 when starting Geodon  40 mg twice daily. MD response:She has tried all of the mood stabilizers with the lowest weight gain risk.  She is not overweight.  But she is very focused on her weight.  The only alternative I see that she is not taking with low weight gain risk is haloperidol and typically ziprasidone  or Geodon  has even lower weight gain risk than haloperidol but there is individual variation. She had a psychiatric hospitalization on Geodon  20 mg twice daily so I would not be in favor of reducing the dose below Geodon  40 mg twice daily.  03/19/20 appt with following noted: Took herself off the Geodon  bc jitters and weight gain. Restarted lamotrigine  at 100 mg BID. Has gained from 128# to 155 # and she attributes to meds including quetiapine and perphenazine. Some anxiety.  Still on clonazepam  1.5 mg daily.  Occ trazodone  prn. Patient reports stable mood and denies depressed or irritable  moods.   Patient denies difficulty with sleep initiation or maintenance. Denies appetite disturbance.  Patient reports that energy and motivation have been good.  Patient denies any difficulty with concentration.  Patient denies any suicidal ideation.  07/18/2020 appt with following noted: New job end of December.  Doing well with it. Agency directoer and DON. Ongoing problem with weight gain 14#/6 mos.  CO worse.  Doesn't know why.  On [employer], walking.  Taking clonazepam  1.5 mg HS. Probably not enough sleep,  but sleeps 8-4.  Same sleep pattern for several years. No sig periods of hypomania except brief after mother's death. Patient reports stable mood and denies depressed or irritable moods.  Patient denies any recent difficulty with anxiety.  Patient denies difficulty with sleep initiation or maintenance. Denies appetite disturbance.  Patient reports that energy and motivation have been good.  Patient denies any difficulty with concentration.  Patient denies any suicidal ideation. Plan: Contingue clonazepam  Taking 1.5 mg HS. It has some mood stabilizing effects and she has restarted the lamotrigine  100 mg twice daily.  She agrees to continue these 2 medications.  She agrees.  1/10/20023 appt noted: B and M passed away.  Called in May 12, 2024 crying and with insomnia. Rough time.  She had driven need to back track on knowing everything about what happened before brother died.  Did same thing after M died.  Couldn't let it go and obsessed on it. Tried Hospice without help. Poor sleep.  Initial with mind racing on deaths and what she needs to do. Taking klonopin  1.5 mg HS, trazodone  100 mg HS. Recognizes couldn't focus and memory issues after brother's death. Plan: Contingue clonazepam  Taking 1.5 mg HS. It has some mood stabilizing effects and she has restarted the lamotrigine  100 mg twice daily.  She agrees to continue these 2 medications.  She agrees. Trial Caplyta  42 mg for bipolar depression and chronic insomnia.  06/21/2021 TC saying Caplyta  was working.  07/04/2021 appt noted: The way my life is will be hard to not have some depression.  Not sadness.  Caplyta  seems to work for sx so far and seems less down.  On it for 2 weeks.  Can't tell about weight gain yet. Exercising and diet healthier. Sleep is better with Caplyta .  Thinks it is helping so far. Not sure the cost yet.   Uncle died and going to MD.   Sx of loneliness but has friends and family.  Feels lonely even in big group.  Still dealing with  grief with M and B.   Plan: Contingue clonazepam  Taking 1.5 mg HS. It has some mood stabilizing effects and she has restarted the lamotrigine  100 mg twice daily.  She agrees to continue these 2 medications.  She agrees. Continue Caplyta  42 mg for bipolar depression and chronic insomnia. Trazodone  prn  08/14/2021 phone call saying she was no longer taking Caplyta  because it was too expensive and also because she feared weight gain with it. 08/21/2021 phone call complaining of insomnia and wanting to increase trazodone .  Okay to increase trazodone  to 200 mg nightly  11/27/2021 appointment with the following noted: Still dealing with HA.  Dr. Oneita, he increased to 200 mg daily. Lost 27# since 05-12-24. B died in 2024-04-11 and started her on spiral down.  Other B dx prostate CA.  Worried over him.  I was such a wreck.  Nephew died of drugs at 37. Just back from  [place] a week ago. Trouble sleeping since 04/11/2024; initial and terminal.  To bed 8 and up 430.  Falling asleep 10 and awake at 3.   Down to clonazepam  0.5 mg 2 at night.  Anxiety better with exercise.  Has dumped a couple of BF and niece said she's needing therapy.  Not sure why. Better with grief now than she was.  Not depressed.  Minimal manic sx Plan: Contingue clonazepam  Taking 1.5 mg HS. It has some mood stabilizing effects and she has restarted the lamotrigine  100 mg twice daily.  She agrees to continue these 2 medications.  She agrees. Disc risk more cycling without another stabilizer. Hydroxyzine  trial 10-20 mg instead of trazodone  Or samples Belsomra  given    04/08/22 appt noted: Current psychiatry relevant meds include Belsomra  20 mg nightly, clonazepam  0.5 mg tablets 3 nightly, lamotrigine  100 mg twice daily, trazodone  200 mg nightly, topiramate  reduced to 50 mg daily Was doing pretty well until Oct which is hard bc anniversary of brother's death.   She asked about increasing lamotrigine  back up.   Did ok with sleep awhile at 2  clonazepam  at night but then as fall approached then needed to go back to 3 of 0.5 mg HS to sleep.  Hard time falling asleep and turning off thoughts. No mood swings.   Working PT job.  Doing some shift work. Plan: Contingue clonazepam  Taking 1.5 mg HS. It has some mood stabilizing effects and she has restarted the lamotrigine  100 mg twice daily.  She agrees to continue these 2 medications.  She agrees. Disc risk more cycling without another stabilizer. Continue Belsomra  20 mg HS Ok increase lamotrigine  as noted: Increase lamotrigine  to 1 and 1/2 tablets twice daily for 2 weeks then 2 tablets twice daily.  08/11/22 appt noted: Struggling.  Grief worsened depression.  Gained wt.  Isolating more.  Family babies her. Still working.  Not sure what to do with life.  Worse since beginning of theyear.  Lost a lot of family last year.  Regrets including staying married too long in a bad marriage.   Don't sleep well or eat right. Work 2nd shift.  Don't sleep enough and 6 hours.  Uses sleep hygiene.   Some counseling 2022 when lost her brother.  Has done Hospice counseling 3 times. More down than anxious but is taking clonazepam  1.5 mg Hs. Plan: Contingue clonazepam  Taking 1.5 mg HS. Disc risk more cycling without another stabilizer. Continue Belsomra  20 mg HS Start Wellbutrin  for depression and increase to 300 mg AM  lamotrigine  to 1 and 1/2 tablets twice daily for 2 weeks then 2 tablets twice daily.  10/13/22 appt noted: Dep with feelings worthlessness.  Still tries to socialize and get out with friends.  They recognize her dep. Wellb slowly increased BP.  Dr. Teresa increased BP meds. Researching on internet.   Asks about stopping Wellbutrin  and using Caplyta  again.  She thinks it helped.   She's gained 8# since here.   Plan: Contingue clonazepam  Taking 1.5 mg HS. Disc risk more cycling without another stabilizer. She agrees to restart Caplyta  42 mg daily. Continue Belsomra  20 mg HS Stop  Wellbutrin  continue lamotrigine  100 mg 2 tablets twice daily.  12/03/22 TC from pt with staff:  Zhaniya's cost of Caplyta  is $638/month. She applied for Pt. Assistance but has been denied because she has insurance. I have talked with the program, and they indicated to me that anyone with insurance not matter the cost of the medication will not qualify for assistance. I asked if she was at the  poverty level would they reconsider, and I was told no that would not matter.  MD resp: Thanks . Give her enough samples until appt. We can talk with rep and see if she can help. I have some stashed away, I can give her if needed to.   01/05/23 appt noted: I'm not going to take Caplyta  anymore.  Worries over the cost.  Can't handle that.   Hard to judge the effect on mood.  Not getting enough sleep.  Had to stop Belsomra  since early June.   Caplyta  doesn't seem to help sleep.  Mostly trouble staying asleep.  Clonazepam  1.5 mg HS doesnot help her stay asleep.   Avg 4-5 hours sleep.   Anxious about her money.  Semi-retired.   She thinks she made impulsive decision about stopping the Wellbutrin . Visited B and he noticed she is obsessive about wt and has tendency to make impulsive decisions.  Recognizes she is obsessive about wt concerns and it's been there a long tim.  Working on improving self. She senses more dep with sadness, crying, isolation.  Ended 2 rel with men without good reason.  Still goes to church.   Meds: clonazepam  1.5 mg HS and sometimes prn anxiety, no Wellbutrin , Caplyta  42 mg HS, lamotrigine  200 mg BID, no Belsomra  DT cost, trazodone  200 mg HS,   01/26/23 TC: Con Dawna DASEN, [place]    01/27/23  5:54 PM Note Patient said she cannot afford Vraylar, is a tier 5. She reports Thorazine  is not working, only getting about 5 hours of sleep a night and reporting weight gain. Reports she was on Belsomra  previously and it wasn't effective. Denies late afternoon caffeine use, stops about 1:00. Has also  been on trazodone  and clonazepam  for anxiety/insomnia. She is asking to go back on Wellbutrin . Said she talked you into taking her off of it. She said she didn't really give it a long enough time, but would like to try it again, thought her insurance covered 100% of the cost.  On 300 XL. Reporting depression at 6.5/10. Did not rate anxiety, but said she is anxious over the cost of meds and not being able to afford them due to the high tiers.     01/28/23 MD note: She complained of wt gain with Vraylar, Geodon , Wellbutrin  (8#); none of which are associated with wt gain.  She is asking I stop Vraylar which is both an antidepressant and a mood stabilizer and replace it with meds that is just an antidepressant.  She is only taking lamotrigine  as a mood stabilizer at this point and is a very weak antimanic medicine.  Starting Wellbutrin  without Vraylar at this time increases her risk of manic symptoms.  I do not like making these major med changes without seeing her.  Put her on the cancellation list and we can discuss the options when I see her. BTW, the cost of Leva is solvable.  The reps will provide samples for her over a long period of time.  I think she should stay on Vraylar using samples until I see her and we can talk about it.     03/09/23 appt noted: Still taking Vraylar and it is doing good.  Mood is better.  Main thing is stress. Mostly dealing with stress causing wt gain.   Lost 5# since failure.   Sleep is somewhat better. Seeing a judge as date and going well.  07/28/23 appt noted: Med: Clonazepam  0.5 mg tablet 3 times daily as needed  anxiety or insomnia, lamotrigine  100 mg tablets 2 tablets BID, topiramate  200 mg daily, trazodone  100 mg tablets 2 nightly as needed, off Vraylar 1.5 mg daily 3 weeks. Stopped Vraylar again bc wt up to 164#.  She felt the Vraylar was the cause and in 2 weeks dropped 7#.  Given me a reason to start eating right and exercising.   Got hacked and was a big  hassle but not a lot of money taken.  Was a hard time.  She has made security changes. She denies mania and depression.    09/01/23 TC: She reports taking Geodon  as prescribed and it is not helping with her depression. I asked her to rate it and she said it varies. She can't say it is a 10, but 7-8/10. At least one day a week she is staying in bed all day. Some days she will get up to shower but go back to bed and sleeps all day. Denied SI. The clonazepam  is helping with anxiety. She takes magnesium  and trazodone  at night and is able to sleep thru the night. She reports having a housekeeper to help with household chores. She reports procrastinating because she doesn't feel like doing things. It is noted that she will refuse any medications that will cause wt gain.   She is taking: Clonazepam  Lamictal  200 mg BID Topamax  200 mg  Trazodone  200 mg Geodon    MD resp:  Put her on the cancellation list.  I do not have a lot of options available to me.  We have tried everything that does not have a risk of weight gain.  The Leva was the best option she was getting benefit but she complained that it was causing weight gain though it does not generally do that. She can try increasing the Geodon  to 40 mg twice a day but I do not know that if that will help with the depression or not.  She may have to agree to go back on something that helped her in the past like the Vraylar.  Otherwise she might have to just wait to her next appointment with me     09/08/23 urgent appt from waiting list: Been more dep.  Also grief with close cousin with terminal CA and passed.  Don't handle grief well since losing mother.  It wasn't like my normal dep. Before he died mild manic buying excessively and then dep with above trigger. Increase in Geodon  is helping over the last few days. Med : Geodon  40 mg BID, others the same. No SE with Geodon .   No reduced appetite.   Sleep is good 8 hours.  When grieving was just staying  in bed but not sleeping.   Fasting till lunch for Clifford. Plan: Contingue  Disc risk more cycling without another stabilizer. continue lamotrigine  100 mg 2 tablets twice daily. continue Geodon  increase 40 mg AM and 40 mg PM for a longer trial  12/28/23 appt noted:  Med: clonazepam  Taking 1.5 mg HS, lamotrigine  100mg  tabl 2 BID, topiramate  200, trazodone  200 HS prn, ziprasidone  20 mg 2 am and 3 PM of r 8 weeks. Stressors affect mood.  Some motivation issues.  Lack of much desire.  She thinks her serotonin and dopamine balance are off bc diet and exercise are wrong.   Psych meds do help.  Sleeping much better.   She says spending is always a problem for her.  No as irritable.  Calmer and more laid back.  But feels lazy.  Works 3 days per week and happy and full of energy at work.   Does private duty nursing.   No SE .      Past Psychiatric Medication Trials: Vraylar 1.5 early weight gain.   Perphenazine 8 mg SE itching and wt, quetiapine 300 mg, Abilify  10,   Geodon  40 BID,   Risperidone, latuda 5# weight gain Caplyta  cost problems Lithium,  Depakote, CBZ 300 sleepiness. Lamotrigine  300  Mirtazapine wt gain  Trazodone  200 NR, hydroxyzine  NR, Belsomra  20 Clonazepam  1.5 mg HS paroxetine, Lexapro, sertraline, duloxetine 120,  Wellbutrin  300 NR  Psychiatric hospitalizations summer 2021  2021 summer she was psych hospitalized which was originally attributed to an intentional overdose which the patient strongly denies.  There is no evidence that she intentionally overdosed or had any suicidal intent.    Review of Systems:  Review of Systems  Constitutional:  Positive for unexpected weight change.  Cardiovascular:  Negative for palpitations.  Neurological:  Positive for headaches. Negative for dizziness, tremors, weakness and light-headedness.  Psychiatric/Behavioral:  Positive for dysphoric mood and sleep disturbance. Negative for agitation and decreased concentration. The patient is  nervous/anxious.     Medications: I have reviewed the patient's current medications.  Current Outpatient Medications  Medication Sig Dispense Refill  . albuterol  (VENTOLIN  HFA) 108 (90 Base) MCG/ACT inhaler     . amLODipine  (NORVASC ) 10 MG tablet Take 1 tablet (10 mg total) by mouth daily. 30 tablet 0  . Ascorbic Acid (VITAMIN C PO) Take 1 tablet by mouth daily.    SABRA CALCIUM PO Take 1 tablet by mouth daily.    . cholecalciferol  (VITAMIN D ) 1000 UNITS tablet Take 1,000 Units by mouth 2 (two) times daily.    . clonazePAM  (KLONOPIN ) 0.5 MG tablet TAKE 1 TABLET (0.5 MG TOTAL) BY MOUTH 3 (THREE) TIMES DAILY AS NEEDED FOR ANXIETY (OR INSOMNIA). 90 tablet 0  . Cyanocobalamin  (VITAMIN B 12 PO) Take by mouth.    . denosumab  (PROLIA ) 60 MG/ML SOLN injection Inject 60 mg into the skin every 6 (six) months. Administer in upper arm, thigh, or abdomen    . fish oil-omega-3 fatty acids 1000 MG capsule Take 1 g by mouth daily.    . fluticasone  (FLONASE ) 50 MCG/ACT nasal spray Place 2 sprays into the nose daily.    . fluticasone  furoate-vilanterol (BREO ELLIPTA) 100-25 MCG/INH AEPB Inhale 1 puff into the lungs daily.    . Glycerin -Hypromellose-PEG 400 ([employer] DRY EYE RELIEF OP) Apply 1 drop to eye at bedtime as needed (dry eyes).    . hydrochlorothiazide  (HYDRODIURIL ) 25 MG tablet Take 25 mg by mouth daily.    . IRON PO Take 1 tablet by mouth daily.    . lamoTRIgine  (LAMICTAL ) 100 MG tablet TAKE 2 TABLETS TWICE DAILY 360 tablet 0  . magnesium  gluconate (MAGONATE) 500 MG tablet Take 500 mg by mouth daily.    . Multiple Vitamins-Minerals (COMPLETE MULTIVITAMIN/MINERAL PO) Take 1 tablet by mouth daily.    . pantoprazole  (PROTONIX ) 40 MG tablet Take 40 mg by mouth daily.    . tiZANidine  (ZANAFLEX ) 2 MG tablet Take 2 mg by mouth every 8 (eight) hours as needed.    . topiramate  (TOPAMAX ) 200 MG tablet Take 1 tablet (200 mg total) by mouth daily. 90 tablet 1  . traZODone  (DESYREL ) 100 MG tablet TAKE 2 TABLETS AT  BEDTIME AS NEEDED FOR SLEEP 180 tablet 3  . vitamin E 1000 UNIT capsule Take 1,000 Units by mouth daily.    SABRA  vitamin k 100 MCG tablet Take 100 mcg by mouth daily.    . ziprasidone  (GEODON ) 20 MG capsule 2 CAPSULES BY MOUTH IN THE MORNING, 3 CAPSULES IN THE EVENING 300 capsule 0   No current facility-administered medications for this visit.    Medication Side Effects: None  Allergies:  Allergies  Allergen Reactions  . Dilaudid  [Hydromorphone  Hcl] Other (See Comments)    Nightmares  . Divalproex Sodium     Became comatose when she took Depakote and Geodon  together.  . Nsaids     kidney problem  . Other Swelling    Nuts  . Perphenazine-Amitriptyline Itching  . Seroquel [Quetiapine Fumerate]     Past Medical History:  Diagnosis Date  . Anxiety   . Asthma   . Bipolar disorder (HCC)   . Cataracts, bilateral   . Depression   . GERD (gastroesophageal reflux disease)   . Hydatid cyst    Left  . Hypertension   . Kidney problem    RT kidney is smaller -> decreased output  . Migraine   . Osteoporosis 11/2016   T score -2.8  improved from prior DEXA  . Right rotator cuff tear   . Vertigo     Family History  Problem Relation Age of Onset  . Hypertension Mother   . Heart disease Mother   . Stroke Mother   . Diabetes Father   . Hypertension Father   . Heart disease Father   . Stroke Father   . Cancer Father        Lymphoma  . Prostate cancer Brother   . Diabetes Brother   . Prostate cancer Brother   . Heart disease Brother   . Stroke Paternal Uncle     Social History   Socioeconomic History  . Marital status: Divorced    Spouse name: Not on file  . Number of children: 1  . Years of education: MSN  . Highest education level: Not on file  Occupational History  . Not on file  Tobacco Use  . Smoking status: Never  . Smokeless tobacco: Never  Vaping Use  . Vaping status: Never Used  Substance and Sexual Activity  . Alcohol  use: Yes    Alcohol /week: 0.0  standard drinks of alcohol     Comment: Rare  . Drug use: No  . Sexual activity: Not Currently    Birth control/protection: Post-menopausal    Comment: 1st intercourse 34 yo-2 partners  Other Topics Concern  . Not on file  Social History Narrative   Lives with husband and daughter   Caffeine use: 2 cups per day   Right handed   Social Drivers of Corporate investment banker Strain: Not on file  Food Insecurity: Not on file  Transportation Needs: Not on file  Physical Activity: Not on file  Stress: Not on file  Social Connections: Not on file  Intimate Partner Violence: Not on file    Past Medical History, Surgical history, Social history, and Family history were reviewed and updated as appropriate.   Please see review of systems for further details on the patient's review from today.   Objective:   Physical Exam:  There were no vitals taken for this visit.  Physical Exam Constitutional:      General: She is not in acute distress. Musculoskeletal:        General: No deformity.  Neurological:     Mental Status: She is alert and oriented to person, place, and time.  Coordination: Coordination normal.  Psychiatric:        Attention and Perception: Attention and perception normal. She does not perceive auditory or visual hallucinations.        Mood and Affect: Mood is anxious and depressed. Affect is not labile, blunt, angry or inappropriate.        Speech: Speech normal.        Behavior: Behavior normal. Behavior is not slowed.        Thought Content: Thought content normal. Thought content is not paranoid or delusional. Thought content does not include homicidal or suicidal ideation. Thought content does not include suicidal plan.        Cognition and Memory: Cognition and memory normal.        Judgment: Judgment normal.     Comments: Insight intact Not suicidal.   Neat and pleasant No mania      Lab Review:     Component Value Date/Time   NA 137 01/02/2020  1122   K 3.8 01/02/2020 1122   CL 105 01/02/2020 1122   CO2 23 01/02/2020 1122   GLUCOSE 114 (H) 01/02/2020 1122   BUN 27 (H) 01/02/2020 1122   CREATININE 1.58 (H) 01/02/2020 1122   CALCIUM 9.3 01/02/2020 1122   PROT 8.4 (H) 01/02/2020 1122   ALBUMIN 5.0 01/02/2020 1122   AST 28 01/02/2020 1122   ALT 19 01/02/2020 1122   ALKPHOS 91 01/02/2020 1122   BILITOT 0.7 01/02/2020 1122   GFRNONAA 33 (L) 01/02/2020 1122   GFRAA 39 (L) 01/02/2020 1122       Component Value Date/Time   WBC 6.0 01/02/2020 1122   RBC 4.51 01/02/2020 1122   HGB 13.6 01/02/2020 1122   HCT 40.5 01/02/2020 1122   PLT 210 01/02/2020 1122   MCV 89.8 01/02/2020 1122   MCH 30.2 01/02/2020 1122   MCHC 33.6 01/02/2020 1122   RDW 11.9 01/02/2020 1122   LYMPHSABS 0.8 01/02/2020 1122   MONOABS 0.3 01/02/2020 1122   EOSABS 0.1 01/02/2020 1122   BASOSABS 0.0 01/02/2020 1122    No results found for: POCLITH, LITHIUM   No results found for: PHENYTOIN, PHENOBARB, VALPROATE, CBMZ   .res Assessment: Plan:    Courtlynn was seen today for follow-up, manic behavior, depression and sleeping problem.  Diagnoses and all orders for this visit:  Generalized anxiety disorder  Insomnia due to mental condition  Mixed bipolar II disorder with rapid cycling (HCC)  Migraine without aura and without status migrainosus, not intractable  Complicated grief   30 min face to face time with patient was spent on counseling and coordination of care. We discussed Patient with a long history of bipolar disorder with rapid cycling that has been somewhat difficult to treat for a variety of reasons.  These include confounding migraine headaches which require a lot of medication and the fact that she is medication sensitive as well as having failed multiple medications.  In addition she refuses meds that can cause weight gain which greatly limit options. This is chronic ongoing problem and won't stay on meds consistently. She  was informed of high relapse risk without mood stabilizer.  She had recent increase in dep but not severe. She is not currently confused.  She is not manic.  She has no psychosis.  There is no evidence of substance abuse. HA manageable.  We discussed the short-term risks associated with benzodiazepines including sedation and increased fall risk among others.  Discussed long-term side effect risk including dependence, potential  withdrawal symptoms, and the potential eventual dose-related risk of dementia.  But recent studies from 2020 dispute this association between benzodiazepines and dementia risk. Newer studies in 2020 do not support an association with dementia.  Disc this again with detail.  Disc try gradually reduce clonazepam .  I probably have an EDO, If I gain weight will have mood swings..  no interest in therapy for this.  Contingue clonazepam  Taking 1.5 mg HS. Try reducing if possible. Disc risk more cycling without another stabilizer.  continue lamotrigine  100 mg 2 tablets twice daily. continue Geodon  increase 40 mg AM and 40 mg-60 mg PM  She agrees.  FU 3 -4  mos  Lorene Macintosh MD, DFAPA  Please see After Visit Summary for patient specific instructions.  No future appointments.        No orders of the defined types were placed in this encounter.   -------------------------------

## 2023-12-31 MED ORDER — TOPIRAMATE 200 MG PO TABS: 200.0000 mg | ORAL_TABLET | Freq: Every day | ORAL | 1 refills | Status: DC

## 2024-01-06 ENCOUNTER — Other Ambulatory Visit: Payer: Self-pay | Admitting: Psychiatry

## 2024-01-06 DIAGNOSIS — F3181 Bipolar II disorder: Secondary | ICD-10-CM

## 2024-01-14 ENCOUNTER — Other Ambulatory Visit: Payer: Self-pay | Admitting: Psychiatry

## 2024-01-14 DIAGNOSIS — F5105 Insomnia due to other mental disorder: Secondary | ICD-10-CM

## 2024-01-15 ENCOUNTER — Other Ambulatory Visit: Payer: Self-pay | Admitting: Psychiatry

## 2024-01-15 DIAGNOSIS — F411 Generalized anxiety disorder: Secondary | ICD-10-CM

## 2024-01-15 DIAGNOSIS — F5105 Insomnia due to other mental disorder: Secondary | ICD-10-CM

## 2024-01-15 DIAGNOSIS — F3181 Bipolar II disorder: Secondary | ICD-10-CM

## 2024-01-18 DIAGNOSIS — G43019 Migraine without aura, intractable, without status migrainosus: Secondary | ICD-10-CM | POA: Diagnosis not present

## 2024-01-21 DIAGNOSIS — M51362 Other intervertebral disc degeneration, lumbar region with discogenic back pain and lower extremity pain: Secondary | ICD-10-CM | POA: Diagnosis not present

## 2024-02-19 DIAGNOSIS — M545 Low back pain, unspecified: Secondary | ICD-10-CM | POA: Diagnosis not present

## 2024-02-26 DIAGNOSIS — M545 Low back pain, unspecified: Secondary | ICD-10-CM | POA: Diagnosis not present

## 2024-02-26 DIAGNOSIS — M533 Sacrococcygeal disorders, not elsewhere classified: Secondary | ICD-10-CM | POA: Diagnosis not present

## 2024-02-26 DIAGNOSIS — M791 Myalgia, unspecified site: Secondary | ICD-10-CM | POA: Diagnosis not present

## 2024-03-03 ENCOUNTER — Other Ambulatory Visit: Payer: Self-pay | Admitting: Psychiatry

## 2024-03-03 DIAGNOSIS — F3181 Bipolar II disorder: Secondary | ICD-10-CM

## 2024-03-04 DIAGNOSIS — Z1231 Encounter for screening mammogram for malignant neoplasm of breast: Secondary | ICD-10-CM | POA: Diagnosis not present

## 2024-03-11 DIAGNOSIS — H04123 Dry eye syndrome of bilateral lacrimal glands: Secondary | ICD-10-CM | POA: Diagnosis not present

## 2024-03-11 DIAGNOSIS — H52203 Unspecified astigmatism, bilateral: Secondary | ICD-10-CM | POA: Diagnosis not present

## 2024-03-11 DIAGNOSIS — H5213 Myopia, bilateral: Secondary | ICD-10-CM | POA: Diagnosis not present

## 2024-03-11 DIAGNOSIS — H524 Presbyopia: Secondary | ICD-10-CM | POA: Diagnosis not present

## 2024-03-11 DIAGNOSIS — H26492 Other secondary cataract, left eye: Secondary | ICD-10-CM | POA: Diagnosis not present

## 2024-03-16 DIAGNOSIS — R55 Syncope and collapse: Secondary | ICD-10-CM | POA: Diagnosis not present

## 2024-04-11 ENCOUNTER — Other Ambulatory Visit: Payer: Self-pay

## 2024-04-11 ENCOUNTER — Telehealth: Payer: Self-pay | Admitting: Psychiatry

## 2024-04-11 DIAGNOSIS — F411 Generalized anxiety disorder: Secondary | ICD-10-CM

## 2024-04-11 DIAGNOSIS — F3181 Bipolar II disorder: Secondary | ICD-10-CM

## 2024-04-11 DIAGNOSIS — F5105 Insomnia due to other mental disorder: Secondary | ICD-10-CM

## 2024-04-11 MED ORDER — CLONAZEPAM 0.5 MG PO TABS: 0.5000 mg | ORAL_TABLET | Freq: Three times a day (TID) | ORAL | 1 refills | Status: DC | PRN

## 2024-04-11 NOTE — Telephone Encounter (Signed)
 Patient called in for Clonazepam  0.5mg . Ph: [phone] Appt 12/29 Pharmacy [facility] [address] [place], [place]

## 2024-04-11 NOTE — Telephone Encounter (Signed)
 Pended

## 2024-04-25 ENCOUNTER — Telehealth: Payer: Self-pay

## 2024-04-25 ENCOUNTER — Other Ambulatory Visit: Payer: Self-pay | Admitting: Psychiatry

## 2024-04-25 DIAGNOSIS — F3181 Bipolar II disorder: Secondary | ICD-10-CM

## 2024-04-25 NOTE — Telephone Encounter (Signed)
 Patient aware of instruction and voiced understanding.

## 2024-04-25 NOTE — Telephone Encounter (Signed)
 I assume she is referring the vaginal estrogen? If so, I will send to the pharmacy. Make sure she knows to use 1 gram nightly x 2 weeks, then twice weekly after that. I will wait to hear back.

## 2024-04-25 NOTE — Telephone Encounter (Signed)
 Patient called asking for something stronger to treat vaginal dryness. Stated it was discussed at her last office visit.

## 2024-04-26 ENCOUNTER — Other Ambulatory Visit: Payer: Self-pay | Admitting: Nurse Practitioner

## 2024-04-26 DIAGNOSIS — N952 Postmenopausal atrophic vaginitis: Secondary | ICD-10-CM

## 2024-04-26 DIAGNOSIS — N941 Unspecified dyspareunia: Secondary | ICD-10-CM

## 2024-04-26 MED ORDER — ESTRADIOL 0.01 % VA CREA: 1.0000 g | TOPICAL_CREAM | VAGINAL | 1 refills | Status: DC

## 2024-04-26 NOTE — Telephone Encounter (Signed)
 Patient called & stated she went to her pharmacy to get her estrogen cream and they did not have it. It was sent to her mail order. She said she usually gets it from mail order & did not tell us  to send it locally. She said it was fine & will wait for it.

## 2024-04-26 NOTE — Telephone Encounter (Signed)
 Vaginal estrogen sent to mail order pharmacy.

## 2024-05-09 NOTE — Telephone Encounter (Signed)
 Spoke with patient, Rx sent on 04/26/24 for estradiol  vaginal cream to [facility], [facility].   Call placed to [facility], spoke [place]. Was advised Rx was mailed on 04/27/24 and delivered 04/29/24. Patient would need to contact Customer Service at [phone] to request replacement.   Advised patient as seen above, patient states she has not received Rx, has been traveling and just returned home. No Rx delivered.  Patient states she pays out of pocket, requesting [facility]. Recommended patient contact [facility] to cancel or have RX refunded. Will review request for new RX with Tiffany and f/u, patient agreeable.   Tiffany -please advise.

## 2024-05-10 ENCOUNTER — Other Ambulatory Visit: Payer: Self-pay | Admitting: Nurse Practitioner

## 2024-05-10 DIAGNOSIS — N941 Unspecified dyspareunia: Secondary | ICD-10-CM

## 2024-05-10 DIAGNOSIS — N952 Postmenopausal atrophic vaginitis: Secondary | ICD-10-CM

## 2024-05-10 MED ORDER — ESTRADIOL 0.01 % VA CREA: 1.0000 g | TOPICAL_CREAM | VAGINAL | 1 refills | Status: AC

## 2024-05-10 NOTE — Telephone Encounter (Signed)
 Patient notified

## 2024-05-10 NOTE — Telephone Encounter (Signed)
 Prescription sent to [facility]

## 2024-05-25 ENCOUNTER — Other Ambulatory Visit: Payer: Self-pay | Admitting: Nurse Practitioner

## 2024-05-25 DIAGNOSIS — N941 Unspecified dyspareunia: Secondary | ICD-10-CM

## 2024-05-25 DIAGNOSIS — N952 Postmenopausal atrophic vaginitis: Secondary | ICD-10-CM

## 2024-05-25 NOTE — Telephone Encounter (Signed)
 Medication refill request: estrace  vaginal cream  Last AEX:  12/21/23 Next AEX: not scheduled  Last MMG (if hormonal medication request): 2024 normal per last aex note  Refill authorized: was last refilled 05/12/24 will refuse

## 2024-06-06 ENCOUNTER — Encounter: Payer: Self-pay | Admitting: Psychiatry

## 2024-06-06 ENCOUNTER — Ambulatory Visit: Admitting: Psychiatry

## 2024-06-06 DIAGNOSIS — F411 Generalized anxiety disorder: Secondary | ICD-10-CM

## 2024-06-06 DIAGNOSIS — F314 Bipolar disorder, current episode depressed, severe, without psychotic features: Secondary | ICD-10-CM

## 2024-06-06 DIAGNOSIS — F5105 Insomnia due to other mental disorder: Secondary | ICD-10-CM

## 2024-06-06 DIAGNOSIS — G43009 Migraine without aura, not intractable, without status migrainosus: Secondary | ICD-10-CM | POA: Diagnosis not present

## 2024-06-06 NOTE — Progress Notes (Signed)
 Kelli Morales [id number] 1950-07-15 73 y.o.   Subjective:   Patient ID:  Kelli Morales is a 73 y.o. (DOB [birth year]) female.  Chief Complaint:  Chief Complaint  Patient presents with   Follow-up    Bipolar, meds, sleep    Kelli Morales presents to the office today for follow-up of bipolar disorder.  hospitalization July 2021 for alleged intentional overdose of medications.  Discharged 01/02/2020 on clonazepam  0.5 mg 3 times daily as needed, lamotrigine  400 mg twice daily, topiramate  200 mg nightly, trazodone  300 mg nightly, and ziprasidone  40 mg twice daily, and zonisamide  300 mg daily.  12/01/2019 appointment notable for patient having seen significant mood stability and reduction of manic symptoms and depression with an increase in lamotrigine  and the addition of Geodon  20 mg twice daily.  No meds were changed.  01/18/20 appt with the following noted: Disc recent hospitalization.  Said she felt weak the day of hospitalization and grief and got distraught.  She denied over taking meds and got pulled over by police for driving erratically.   She denies intentional overdose but she was dehydrated. Upset over being at behavioral health hosp of 2 days. Lamotrigine  reduced to 1 daily. Reduced Zonesimide gradually per MD. Reduced topiramate  to 100 mg daily. Says she ate normally that day.   Says she's always been sensitive to Geodon  but is doing OK with it right now.  No other explanation for what happened that day except she was distraught and overwhelmed with mother's estate. Started grief therapy at Hospice. Plan no med changes as recently hospitalized  02/10/2020 appt with the following noted: CO weight gain and thinks it is Geodon .  Gained weight gain on Seroquel and hasn't lost it.  Wants to wean off clonazepam .  Has cut so many meds back dramatically. BP has come back to normal. I feel really good.  Calmer and more focused.  No brain fog.  No sadness.  Don't get as hyper.   Anxiety not out of control.  Better overall. No HA.  Meditation and therapy and it helped to wean off migraine meds.   Now more ready to let go of clonazepam .   Sleep variable.  Get wound up at work and can have a hard time sleeping but not too bad. Plan: Disc her desire to taper clonazepam .  Need to protect sleep.  Taking 1.5 mg HS. Reduce no faster than 0.25 mg reduction per month.   02/22/2020 phone call from patient complaining of 10 pound weight gain since January 04, 2020 when starting Geodon  40 mg twice daily. MD response:She has tried all of the mood stabilizers with the lowest weight gain risk.  She is not overweight.  But she is very focused on her weight.  The only alternative I see that she is not taking with low weight gain risk is haloperidol and typically ziprasidone  or Geodon  has even lower weight gain risk than haloperidol but there is individual variation. She had a psychiatric hospitalization on Geodon  20 mg twice daily so I would not be in favor of reducing the dose below Geodon  40 mg twice daily.  03/19/20 appt with following noted: Took herself off the Geodon  bc jitters and weight gain. Restarted lamotrigine  at 100 mg BID. Has gained from 128# to 155 # and she attributes to meds including quetiapine and perphenazine. Some anxiety.  Still on clonazepam  1.5 mg daily.  Occ trazodone  prn. Patient reports stable mood and denies depressed or irritable moods.   Patient denies  difficulty with sleep initiation or maintenance. Denies appetite disturbance.  Patient reports that energy and motivation have been good.  Patient denies any difficulty with concentration.  Patient denies any suicidal ideation.  07/18/2020 appt with following noted: New job end of December.  Doing well with it. Agency directoer and DON. Ongoing problem with weight gain 14#/6 mos.  CO worse.  Doesn't know why.  On [employer], walking.  Taking clonazepam  1.5 mg HS. Probably not enough sleep, but sleeps 8-4.  Same sleep  pattern for several years. No sig periods of hypomania except brief after mother's death. Patient reports stable mood and denies depressed or irritable moods.  Patient denies any recent difficulty with anxiety.  Patient denies difficulty with sleep initiation or maintenance. Denies appetite disturbance.  Patient reports that energy and motivation have been good.  Patient denies any difficulty with concentration.  Patient denies any suicidal ideation. Plan: Contingue clonazepam  Taking 1.5 mg HS. It has some mood stabilizing effects and she has restarted the lamotrigine  100 mg twice daily.  She agrees to continue these 2 medications.  She agrees.  06/19/2071 appt noted: B and M passed away.  Called in 04-29-2025 crying and with insomnia. Rough time.  She had driven need to back track on knowing everything about what happened before brother died.  Did same thing after M died.  Couldn't let it go and obsessed on it. Tried Hospice without help. Poor sleep.  Initial with mind racing on deaths and what she needs to do. Taking klonopin  1.5 mg HS, trazodone  100 mg HS. Recognizes couldn't focus and memory issues after brother's death. Plan: Contingue clonazepam  Taking 1.5 mg HS. It has some mood stabilizing effects and she has restarted the lamotrigine  100 mg twice daily.  She agrees to continue these 2 medications.  She agrees. Trial Caplyta  42 mg for bipolar depression and chronic insomnia.  06/21/2021 TC saying Caplyta  was working.  07/04/2021 appt noted: The way my life is will be hard to not have some depression.  Not sadness.  Caplyta  seems to work for sx so far and seems less down.  On it for 2 weeks.  Can't tell about weight gain yet. Exercising and diet healthier. Sleep is better with Caplyta .  Thinks it is helping so far. Not sure the cost yet.   Uncle died and going to MD.   Sx of loneliness but has friends and family.  Feels lonely even in big group.  Still dealing with grief with M and B.   Plan:  Contingue clonazepam  Taking 1.5 mg HS. It has some mood stabilizing effects and she has restarted the lamotrigine  100 mg twice daily.  She agrees to continue these 2 medications.  She agrees. Continue Caplyta  42 mg for bipolar depression and chronic insomnia. Trazodone  prn  08/14/2021 phone call saying she was no longer taking Caplyta  because it was too expensive and also because she feared weight gain with it. 08/21/2021 phone call complaining of insomnia and wanting to increase trazodone .  Okay to increase trazodone  to 200 mg nightly  11/27/2021 appointment with the following noted: Still dealing with HA.  Dr. Oneita, he increased to 200 mg daily. Lost 27# since 29-Apr-2025. B died in 29-Mar-2025 and started her on spiral down.  Other B dx prostate CA.  Worried over him.  I was such a wreck.  Nephew died of drugs at 62. Just back from  [place] a week ago. Trouble sleeping since 03-29-2025; initial and terminal.  To bed 8 and  up 430.  Falling asleep 10 and awake at 3.   Down to clonazepam  0.5 mg 2 at night.  Anxiety better with exercise.  Has dumped a couple of BF and niece said she's needing therapy.  Not sure why. Better with grief now than she was.  Not depressed.  Minimal manic sx Plan: Contingue clonazepam  Taking 1.5 mg HS. It has some mood stabilizing effects and she has restarted the lamotrigine  100 mg twice daily.  She agrees to continue these 2 medications.  She agrees. Disc risk more cycling without another stabilizer. Hydroxyzine  trial 10-20 mg instead of trazodone  Or samples Belsomra  given    04/08/22 appt noted: Current psychiatry relevant meds include Belsomra  20 mg nightly, clonazepam  0.5 mg tablets 3 nightly, lamotrigine  100 mg twice daily, trazodone  200 mg nightly, topiramate  reduced to 50 mg daily Was doing pretty well until Oct which is hard bc anniversary of brother's death.   She asked about increasing lamotrigine  back up.   Did ok with sleep awhile at 2 clonazepam  at night but then as  fall approached then needed to go back to 3 of 0.5 mg HS to sleep.  Hard time falling asleep and turning off thoughts. No mood swings.   Working PT job.  Doing some shift work. Plan: Contingue clonazepam  Taking 1.5 mg HS. It has some mood stabilizing effects and she has restarted the lamotrigine  100 mg twice daily.  She agrees to continue these 2 medications.  She agrees. Disc risk more cycling without another stabilizer. Continue Belsomra  20 mg HS Ok increase lamotrigine  as noted: Increase lamotrigine  to 1 and 1/2 tablets twice daily for 2 weeks then 2 tablets twice daily.  08/11/22 appt noted: Struggling.  Grief worsened depression.  Gained wt.  Isolating more.  Family babies her. Still working.  Not sure what to do with life.  Worse since beginning of theyear.  Lost a lot of family last year.  Regrets including staying married too long in a bad marriage.   Don't sleep well or eat right. Work 2nd shift.  Don't sleep enough and 6 hours.  Uses sleep hygiene.   Some counseling 2022 when lost her brother.  Has done Hospice counseling 3 times. More down than anxious but is taking clonazepam  1.5 mg Hs. Plan: Contingue clonazepam  Taking 1.5 mg HS. Disc risk more cycling without another stabilizer. Continue Belsomra  20 mg HS Start Wellbutrin  for depression and increase to 300 mg AM  lamotrigine  to 1 and 1/2 tablets twice daily for 2 weeks then 2 tablets twice daily.  10/13/22 appt noted: Dep with feelings worthlessness.  Still tries to socialize and get out with friends.  They recognize her dep. Wellb slowly increased BP.  Dr. Teresa increased BP meds. Researching on internet.   Asks about stopping Wellbutrin  and using Caplyta  again.  She thinks it helped.   She's gained 8# since here.   Plan: Contingue clonazepam  Taking 1.5 mg HS. Disc risk more cycling without another stabilizer. She agrees to restart Caplyta  42 mg daily. Continue Belsomra  20 mg HS Stop Wellbutrin  continue lamotrigine  100 mg  2 tablets twice daily.  12/03/22 TC from pt with staff:  Ipek's cost of Caplyta  is $638/month. She applied for Pt. Assistance but has been denied because she has insurance. I have talked with the program, and they indicated to me that anyone with insurance not matter the cost of the medication will not qualify for assistance. I asked if she was at the poverty level would they  reconsider, and I was told no that would not matter.  MD resp: Thanks . Give her enough samples until appt. We can talk with rep and see if she can help. I have some stashed away, I can give her if needed to.   01/05/23 appt noted: I'm not going to take Caplyta  anymore.  Worries over the cost.  Can't handle that.   Hard to judge the effect on mood.  Not getting enough sleep.  Had to stop Belsomra  since early June.   Caplyta  doesn't seem to help sleep.  Mostly trouble staying asleep.  Clonazepam  1.5 mg HS doesnot help her stay asleep.   Avg 4-5 hours sleep.   Anxious about her money.  Semi-retired.   She thinks she made impulsive decision about stopping the Wellbutrin . Visited B and he noticed she is obsessive about wt and has tendency to make impulsive decisions.  Recognizes she is obsessive about wt concerns and it's been there a long tim.  Working on improving self. She senses more dep with sadness, crying, isolation.  Ended 2 rel with men without good reason.  Still goes to church.   Meds: clonazepam  1.5 mg HS and sometimes prn anxiety, no Wellbutrin , Caplyta  42 mg HS, lamotrigine  200 mg BID, no Belsomra  DT cost, trazodone  200 mg HS,   01/26/23 TC: Kelli Morales, [place]    01/27/23  5:54 PM Note Patient said she cannot afford Vraylar, is a tier 5. She reports Thorazine  is not working, only getting about 5 hours of sleep a night and reporting weight gain. Reports she was on Belsomra  previously and it wasn't effective. Denies late afternoon caffeine use, stops about 1:00. Has also been on trazodone  and clonazepam  for  anxiety/insomnia. She is asking to go back on Wellbutrin . Said she talked you into taking her off of it. She said she didn't really give it a long enough time, but would like to try it again, thought her insurance covered 100% of the cost.  On 300 XL. Reporting depression at 6.5/10. Did not rate anxiety, but said she is anxious over the cost of meds and not being able to afford them due to the high tiers.     01/28/23 MD note: She complained of wt gain with Vraylar, Geodon , Wellbutrin  (8#); none of which are associated with wt gain.  She is asking I stop Vraylar which is both an antidepressant and a mood stabilizer and replace it with meds that is just an antidepressant.  She is only taking lamotrigine  as a mood stabilizer at this point and is a very weak antimanic medicine.  Starting Wellbutrin  without Vraylar at this time increases her risk of manic symptoms.  I do not like making these major med changes without seeing her.  Put her on the cancellation list and we can discuss the options when I see her. BTW, the cost of Leva is solvable.  The reps will provide samples for her over a long period of time.  I think she should stay on Vraylar using samples until I see her and we can talk about it.     03/09/23 appt noted: Still taking Vraylar and it is doing good.  Mood is better.  Main thing is stress. Mostly dealing with stress causing wt gain.   Lost 5# since failure.   Sleep is somewhat better. Seeing a judge as date and going well.  07/28/23 appt noted: Med: Clonazepam  0.5 mg tablet 3 times daily as needed anxiety or insomnia, lamotrigine   100 mg tablets 2 tablets BID, topiramate  200 mg daily, trazodone  100 mg tablets 2 nightly as needed, off Vraylar 1.5 mg daily 3 weeks. Stopped Vraylar again bc wt up to 164#.  She felt the Vraylar was the cause and in 2 weeks dropped 7#.  Given me a reason to start eating right and exercising.   Got hacked and was a big hassle but not a lot of money taken.  Was  a hard time.  She has made security changes. She denies mania and depression.    09/01/23 TC: She reports taking Geodon  as prescribed and it is not helping with her depression. I asked her to rate it and she said it varies. She can't say it is a 10, but 7-8/10. At least one day a week she is staying in bed all day. Some days she will get up to shower but go back to bed and sleeps all day. Denied SI. The clonazepam  is helping with anxiety. She takes magnesium  and trazodone  at night and is able to sleep thru the night. She reports having a housekeeper to help with household chores. She reports procrastinating because she doesn't feel like doing things. It is noted that she will refuse any medications that will cause wt gain.   She is taking: Clonazepam  Lamictal  200 mg BID Topamax  200 mg  Trazodone  200 mg Geodon    MD resp:  Put her on the cancellation list.  I do not have a lot of options available to me.  We have tried everything that does not have a risk of weight gain.  The Leva was the best option she was getting benefit but she complained that it was causing weight gain though it does not generally do that. She can try increasing the Geodon  to 40 mg twice a day but I do not know that if that will help with the depression or not.  She may have to agree to go back on something that helped her in the past like the Vraylar.  Otherwise she might have to just wait to her next appointment with me     09/08/23 urgent appt from waiting list: Been more dep.  Also grief with close cousin with terminal CA and passed.  Don't handle grief well since losing mother.  It wasn't like my normal dep. Before he died mild manic buying excessively and then dep with above trigger. Increase in Geodon  is helping over the last few days. Med : Geodon  40 mg BID, others the same. No SE with Geodon .   No reduced appetite.   Sleep is good 8 hours.  When grieving was just staying in bed but not sleeping.   Fasting till  lunch for [place]. Plan: Contingue  Disc risk more cycling without another stabilizer. continue lamotrigine  100 mg 2 tablets twice daily. continue Geodon  increase 40 mg AM and 40 mg PM for a longer trial  12/28/23 appt noted:  Med: clonazepam  Taking 1.5 mg HS, lamotrigine  100mg  tabl 2 BID, topiramate  200, trazodone  200 HS prn, ziprasidone  20 mg 2 am and 3 PM of r 8 weeks. Stressors affect mood.  Some motivation issues.  Lack of much desire.  She thinks her serotonin and dopamine balance are off bc diet and exercise are wrong.   Psych meds do help.  Sleeping much better.   She says spending is always a problem for her.  No as irritable.  Calmer and more laid back.  But feels lazy.   Works 3 days  per week and happy and full of energy at work.   Does private duty nursing.   No SE .   Plan no changes  05/27/24 appt noted:  Med: Med: clonazepam  Taking 1.5 mg HS, lamotrigine  100mg  tabl 2 BID, topiramate  200, trazodone  200 HS prn, ziprasidone  20 mg 2 am and 2 PM of r 8 weeks. More down than in years.  Felt it like being poisoned of low energy, sleepy even at work.  No want to do anything.   Saw Dr. Teresa for eval and did dementia test without evidence.   When stops lamotrigine  I feel bad.  Still on full daily dose.   Didn't decorate for Xmas.  No trigger.   Enjoyed trip to [place] at [place] but back into dep. Back pain is a problem interfering with life. No SI.  No mania.    Past Psychiatric Medication Trials: Vraylar 1.5 early weight gain.   Perphenazine 8 mg SE itching and wt, quetiapine 300 mg, Abilify  10,   Geodon  40 BID,   Risperidone, latuda 5# weight gain Caplyta  cost problems Lithium,  Depakote, CBZ 300 sleepiness. Lamotrigine  400  Mirtazapine wt gain  Trazodone  200 NR, hydroxyzine  NR, Belsomra  20 Clonazepam  1.5 mg HS paroxetine, Lexapro, sertraline, duloxetine 120,  Wellbutrin  300 NR  Psychiatric hospitalizations summer 2021  2021 summer she was psych hospitalized which was  originally attributed to an intentional overdose which the patient strongly denies.  There is no evidence that she intentionally overdosed or had any suicidal intent.    Review of Systems:  Review of Systems  Constitutional:  Positive for fatigue and unexpected weight change.  Cardiovascular:  Negative for palpitations.  Neurological:  Positive for headaches. Negative for dizziness, tremors and light-headedness.  Psychiatric/Behavioral:  Positive for dysphoric mood and sleep disturbance. Negative for agitation and decreased concentration. The patient is nervous/anxious.     Medications: I have reviewed the patient's current medications.  Current Outpatient Medications  Medication Sig Dispense Refill   albuterol  (VENTOLIN  HFA) 108 (90 Base) MCG/ACT inhaler      amLODipine  (NORVASC ) 10 MG tablet Take 1 tablet (10 mg total) by mouth daily. 30 tablet 0   Ascorbic Acid (VITAMIN C PO) Take 1 tablet by mouth daily.     CALCIUM PO Take 1 tablet by mouth daily.     cholecalciferol  (VITAMIN D ) 1000 UNITS tablet Take 1,000 Units by mouth 2 (two) times daily.     clonazePAM  (KLONOPIN ) 0.5 MG tablet Take 1 tablet (0.5 mg total) by mouth 3 (three) times daily as needed for anxiety (or insomnia). 90 tablet 1   Cyanocobalamin  (VITAMIN B 12 PO) Take by mouth.     denosumab  (PROLIA ) 60 MG/ML SOLN injection Inject 60 mg into the skin every 6 (six) months. Administer in upper arm, thigh, or abdomen     estradiol  (ESTRACE ) 0.01 % CREA vaginal cream Place 1 g vaginally 2 (two) times a week. Initial dose: nightly x 2 weeks, then twice weekly. 42.5 g 1   fish oil-omega-3 fatty acids 1000 MG capsule Take 1 g by mouth daily.     fluticasone  (FLONASE ) 50 MCG/ACT nasal spray Place 2 sprays into the nose daily.     fluticasone  furoate-vilanterol (BREO ELLIPTA) 100-25 MCG/INH AEPB Inhale 1 puff into the lungs daily.     Glycerin -Hypromellose-PEG 400 ([employer] DRY EYE RELIEF OP) Apply 1 drop to eye at bedtime as needed (dry  eyes).     hydrochlorothiazide  (HYDRODIURIL ) 25 MG tablet Take 25 mg by  mouth daily.     IRON PO Take 1 tablet by mouth daily.     lamoTRIgine  (LAMICTAL ) 100 MG tablet TAKE 2 TABLETS TWICE DAILY 360 tablet 1   magnesium  gluconate (MAGONATE) 500 MG tablet Take 500 mg by mouth daily.     Multiple Vitamins-Minerals (COMPLETE MULTIVITAMIN/MINERAL PO) Take 1 tablet by mouth daily.     pantoprazole  (PROTONIX ) 40 MG tablet Take 40 mg by mouth daily.     tiZANidine  (ZANAFLEX ) 2 MG tablet Take 2 mg by mouth every 8 (eight) hours as needed.     topiramate  (TOPAMAX ) 200 MG tablet Take 1 tablet (200 mg total) by mouth daily. 90 tablet 1   traZODone  (DESYREL ) 100 MG tablet TAKE 2 TABLETS BY MOUTH AT BEDTIME AS NEEDED FOR SLEEP. 180 tablet 0   vitamin E 1000 UNIT capsule Take 1,000 Units by mouth daily.     vitamin k 100 MCG tablet Take 100 mcg by mouth daily.     ziprasidone  (GEODON ) 20 MG capsule TAKE 2 CAPSULES IN THE MORNING AND TAKE 3 CAPSULES IN THE EVENING 450 capsule 1   No current facility-administered medications for this visit.    Medication Side Effects: None  Allergies:  Allergies  Allergen Reactions   Dilaudid  [Hydromorphone  Hcl] Other (See Comments)    Nightmares   Divalproex Sodium     Became comatose when she took Depakote and Geodon  together.   Nsaids     kidney problem   Other Swelling    Nuts   Perphenazine-Amitriptyline Itching   Seroquel [Quetiapine Fumerate]     Past Medical History:  Diagnosis Date   Anxiety    Asthma    Bipolar disorder (HCC)    Cataracts, bilateral    Depression    GERD (gastroesophageal reflux disease)    Hydatid cyst    Left   Hypertension    Kidney problem    RT kidney is smaller -> decreased output   Migraine    Osteoporosis 11/2016   T score -2.8  improved from prior DEXA   Right rotator cuff tear    Vertigo     Family History  Problem Relation Age of Onset   Hypertension Mother    Heart disease Mother    Stroke Mother     Diabetes Father    Hypertension Father    Heart disease Father    Stroke Father    Cancer Father        Lymphoma   Prostate cancer Brother    Diabetes Brother    Prostate cancer Brother    Heart disease Brother    Stroke Paternal Uncle     Social History   Socioeconomic History   Marital status: Divorced    Spouse name: Not on file   Number of children: 1   Years of education: MSN   Highest education level: Not on file  Occupational History   Not on file  Tobacco Use   Smoking status: Never   Smokeless tobacco: Never  Vaping Use   Vaping status: Never Used  Substance and Sexual Activity   Alcohol  use: Yes    Alcohol /week: 0.0 standard drinks of alcohol     Comment: Rare   Drug use: No   Sexual activity: Not Currently    Birth control/protection: Post-menopausal    Comment: 1st intercourse 55 yo-2 partners  Other Topics Concern   Not on file  Social History Narrative   Lives with husband and daughter   Caffeine use: 2  cups per day   Right handed   Social Drivers of Health   Tobacco Use: Low Risk (06/06/2024)   Patient History    Smoking Tobacco Use: Never    Smokeless Tobacco Use: Never    Passive Exposure: Not on file  Financial Resource Strain: Not on file  Food Insecurity: Not on file  Transportation Needs: Not on file  Physical Activity: Not on file  Stress: Not on file  Social Connections: Not on file  Intimate Partner Violence: Not on file  Depression (EYV7-0): Not on file  Alcohol  Screen: Not on file  Housing: Not on file  Utilities: Not on file  Health Literacy: Not on file    Past Medical History, Surgical history, Social history, and Family history were reviewed and updated as appropriate.   Please see review of systems for further details on the patient's review from today.   Objective:   Physical Exam:  There were no vitals taken for this visit.  Physical Exam Constitutional:      General: She is not in acute  distress. Musculoskeletal:        General: No deformity.  Neurological:     Mental Status: She is alert and oriented to person, place, and time.     Coordination: Coordination normal.  Psychiatric:        Attention and Perception: Attention and perception normal. She does not perceive auditory or visual hallucinations.        Mood and Affect: Mood is anxious and depressed. Affect is not labile, blunt or inappropriate.        Speech: Speech normal.        Behavior: Behavior is slowed.        Thought Content: Thought content normal. Thought content is not paranoid or delusional. Thought content does not include homicidal or suicidal ideation. Thought content does not include suicidal plan.        Cognition and Memory: Cognition and memory normal.        Judgment: Judgment normal.     Comments: Insight intact Not suicidal.   Neat and pleasant No mania      Lab Review:     Component Value Date/Time   NA 137 01/02/2020 1122   K 3.8 01/02/2020 1122   CL 105 01/02/2020 1122   CO2 23 01/02/2020 1122   GLUCOSE 114 (H) 01/02/2020 1122   BUN 27 (H) 01/02/2020 1122   CREATININE 1.58 (H) 01/02/2020 1122   CALCIUM 9.3 01/02/2020 1122   PROT 8.4 (H) 01/02/2020 1122   ALBUMIN 5.0 01/02/2020 1122   AST 28 01/02/2020 1122   ALT 19 01/02/2020 1122   ALKPHOS 91 01/02/2020 1122   BILITOT 0.7 01/02/2020 1122   GFRNONAA 33 (L) 01/02/2020 1122   GFRAA 39 (L) 01/02/2020 1122       Component Value Date/Time   WBC 6.0 01/02/2020 1122   RBC 4.51 01/02/2020 1122   HGB 13.6 01/02/2020 1122   HCT 40.5 01/02/2020 1122   PLT 210 01/02/2020 1122   MCV 89.8 01/02/2020 1122   MCH 30.2 01/02/2020 1122   MCHC 33.6 01/02/2020 1122   RDW 11.9 01/02/2020 1122   LYMPHSABS 0.8 01/02/2020 1122   MONOABS 0.3 01/02/2020 1122   EOSABS 0.1 01/02/2020 1122   BASOSABS 0.0 01/02/2020 1122    No results found for: POCLITH, LITHIUM   No results found for: PHENYTOIN, PHENOBARB, VALPROATE, CBMZ    .res Assessment: Plan:    Lashay was seen today for follow-up.  Diagnoses and all orders for this visit:  Bipolar 1 disorder, depressed, severe (HCC)  Generalized anxiety disorder  Insomnia due to mental condition  Migraine without aura and without status migrainosus, not intractable    30 min face to face time with patient was spent on counseling and coordination of care. We discussed Patient with a long history of bipolar disorder with rapid cycling that has been somewhat difficult to treat for a variety of reasons.  These include confounding migraine headaches which require a lot of medication and the fact that she is medication sensitive as well as having failed multiple medications.  In addition she refuses meds that can cause weight gain which greatly limit options. This is chronic ongoing problem and won't stay on meds consistently. She was informed of high relapse risk without mood stabilizer.  She is not currently confused.  She is not manic.  She has no psychosis.  There is no evidence of substance abuse. HA manageable.  We discussed the short-term risks associated with benzodiazepines including sedation and increased fall risk among others.  Discussed long-term side effect risk including dependence, potential withdrawal symptoms, and the potential eventual dose-related risk of dementia.  But recent studies from 2020 dispute this association between benzodiazepines and dementia risk. Newer studies in 2020 do not support an association with dementia.  Disc this again with detail.  Disc try gradually reduce clonazepam .  Consider pramipexole for TRD  I probably have an EDO, If I gain weight will have mood swings..  no interest in therapy for this.  Contingue clonazepam  Taking 1.5 mg HS. Try reducing if possible. Continue topiramate   For depression:  Start Vraylar 1.5 mg daily  Reduce Geodon  by 1 capsule daily starting with morning dose and reduce once weekly.   If  dizzy let us  know Reduce lamotrigine  to 1 in the AM and 2 tablets in the PM She agrees.  FU 1  mos  Lorene Macintosh MD, DFAPA  Please see After Visit Summary for patient specific instructions.  No future appointments.        No orders of the defined types were placed in this encounter.   -------------------------------

## 2024-06-06 NOTE — Patient Instructions (Addendum)
 Start Vraylar 1.5 mg daily  Reduce Geodon  by 1 capsule daily starting with morning dose and reduce once weekly.   If dizzy let us  know Reduce lamotrigine  to 1 in the AM and 2 tablets in the PM

## 2024-06-16 ENCOUNTER — Telehealth: Payer: Self-pay | Admitting: Psychiatry

## 2024-06-16 DIAGNOSIS — F3181 Bipolar II disorder: Secondary | ICD-10-CM

## 2024-06-16 DIAGNOSIS — F5105 Insomnia due to other mental disorder: Secondary | ICD-10-CM

## 2024-06-16 DIAGNOSIS — F411 Generalized anxiety disorder: Secondary | ICD-10-CM

## 2024-06-17 NOTE — Telephone Encounter (Signed)
 Please send

## 2024-06-17 NOTE — Telephone Encounter (Signed)
 Patient lvm at 11:21 requesting refill for Clonazepam  0.5,g. States that she is completely out. PH: [phone] appt 2/6 Pharmacy  [facility] [address] [place], [place]

## 2024-06-18 NOTE — Telephone Encounter (Signed)
 Dr. Geoffry approved Rx

## 2024-06-21 ENCOUNTER — Telehealth: Payer: Self-pay | Admitting: Psychiatry

## 2024-06-21 NOTE — Telephone Encounter (Signed)
 LF 1/9 for 30 days and has a RF.

## 2024-06-21 NOTE — Telephone Encounter (Signed)
 Pt called reporting new mail order pharmacy is [facility]. Pt will stay with [facility] for local.  Pt also request 90 day Rx to [facility] for Clonazepam .

## 2024-06-24 ENCOUNTER — Other Ambulatory Visit: Payer: Self-pay

## 2024-07-04 ENCOUNTER — Other Ambulatory Visit: Payer: Self-pay

## 2024-07-04 DIAGNOSIS — F5105 Insomnia due to other mental disorder: Secondary | ICD-10-CM

## 2024-07-04 DIAGNOSIS — F3181 Bipolar II disorder: Secondary | ICD-10-CM

## 2024-07-04 DIAGNOSIS — F411 Generalized anxiety disorder: Secondary | ICD-10-CM

## 2024-07-04 MED ORDER — CLONAZEPAM 0.5 MG PO TABS: 0.5000 mg | ORAL_TABLET | Freq: Three times a day (TID) | ORAL | 0 refills | Status: AC | PRN

## 2024-07-04 NOTE — Telephone Encounter (Signed)
 Pended 90-day supply to [facility]

## 2024-07-05 ENCOUNTER — Telehealth: Payer: Self-pay | Admitting: Psychiatry

## 2024-07-05 DIAGNOSIS — F411 Generalized anxiety disorder: Secondary | ICD-10-CM

## 2024-07-05 DIAGNOSIS — F3181 Bipolar II disorder: Secondary | ICD-10-CM

## 2024-07-05 DIAGNOSIS — F5105 Insomnia due to other mental disorder: Secondary | ICD-10-CM

## 2024-07-05 MED ORDER — TRAZODONE HCL 100 MG PO TABS: ORAL_TABLET | ORAL | 1 refills | Status: AC

## 2024-07-05 MED ORDER — TOPIRAMATE 200 MG PO TABS: 200.0000 mg | ORAL_TABLET | Freq: Every day | ORAL | 1 refills | Status: AC

## 2024-07-05 NOTE — Telephone Encounter (Signed)
 Topamax  and trazodone  went to [facility]

## 2024-07-05 NOTE — Telephone Encounter (Signed)
 Pharmacy lvm that they need a 90 day supply of topamax  and trazadone. [facility]

## 2024-07-07 ENCOUNTER — Telehealth: Payer: Self-pay | Admitting: Psychiatry

## 2024-07-07 NOTE — Telephone Encounter (Signed)
 Pt has been on both medications long-term.

## 2024-07-07 NOTE — Telephone Encounter (Signed)
 Pharmacy called due to other provider prescribed pt Tramadol at local pharmacy. [facility] called about Clonazepam  Rx sent by [facility]. Risk of fall due to pt age. Return call [phone] ref # [id number]

## 2024-07-15 ENCOUNTER — Encounter: Payer: Self-pay | Admitting: Psychiatry

## 2024-07-15 ENCOUNTER — Ambulatory Visit (INDEPENDENT_AMBULATORY_CARE_PROVIDER_SITE_OTHER): Payer: Medicare (Managed Care) | Admitting: Psychiatry

## 2024-07-15 DIAGNOSIS — G43009 Migraine without aura, not intractable, without status migrainosus: Secondary | ICD-10-CM

## 2024-07-15 DIAGNOSIS — F411 Generalized anxiety disorder: Secondary | ICD-10-CM

## 2024-07-15 DIAGNOSIS — F314 Bipolar disorder, current episode depressed, severe, without psychotic features: Secondary | ICD-10-CM

## 2024-07-15 DIAGNOSIS — F5105 Insomnia due to other mental disorder: Secondary | ICD-10-CM

## 2024-07-15 NOTE — Progress Notes (Signed)
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 file    Past Medical History, Surgical history, Social history, and Family history were reviewed and updated as appropriate.   Please see review of systems for further details on the patient's review from today.   Objective:   Physical Exam:  There were no vitals taken for this visit.  Physical Exam Constitutional:      General: She is not in acute distress. Musculoskeletal:        General: No deformity.  Neurological:     Mental Status: She is alert and oriented to person, place, and time.     Coordination: Coordination normal.  Psychiatric:        Attention and Perception: Attention and perception normal. She does not perceive auditory or visual hallucinations.        Mood and Affect: Mood is anxious and depressed. Affect is not labile, blunt or inappropriate.        Speech: Speech normal.        Thought Content: Thought content normal. Thought content is not paranoid or delusional. Thought content does not include homicidal or suicidal ideation. Thought content does not include suicidal plan.        Cognition and Memory: Cognition and memory normal.        Judgment: Judgment normal.     Comments: Insight intact Not suicidal.   Neat and pleasant No mania       Lab Review:     Component Value Date/Time   NA 137 01/02/2020 1122   K 3.8  01/02/2020 1122   CL 105 01/02/2020 1122   CO2 23 01/02/2020 1122   GLUCOSE 114 (H) 01/02/2020 1122   BUN 27 (H) 01/02/2020 1122   CREATININE 1.58 (H) 01/02/2020 1122   CALCIUM 9.3 01/02/2020 1122   PROT 8.4 (H) 01/02/2020 1122   ALBUMIN 5.0 01/02/2020 1122   AST 28 01/02/2020 1122   ALT 19 01/02/2020 1122   ALKPHOS 91 01/02/2020 1122   BILITOT 0.7 01/02/2020 1122   GFRNONAA 33 (L) 01/02/2020 1122   GFRAA 39 (L) 01/02/2020 1122       Component Value Date/Time   WBC 6.0 01/02/2020 1122   RBC 4.51 01/02/2020 1122   HGB 13.6 01/02/2020 1122   HCT 40.5 01/02/2020 1122   PLT 210 01/02/2020 1122   MCV 89.8 01/02/2020 1122   MCH 30.2 01/02/2020 1122   MCHC 33.6 01/02/2020 1122   RDW 11.9 01/02/2020 1122   LYMPHSABS 0.8 01/02/2020 1122   MONOABS 0.3 01/02/2020 1122   EOSABS 0.1 01/02/2020 1122   BASOSABS 0.0 01/02/2020 1122    No results found for: POCLITH, LITHIUM   No results found for: PHENYTOIN, PHENOBARB, VALPROATE, CBMZ   .res Assessment: Plan:    Darriel was seen today for follow-up, depression, anxiety, medication reaction and sleeping problem.  Diagnoses and all orders for this visit:  Bipolar 1 disorder, depressed, severe (HCC)  Generalized anxiety disorder  Insomnia due to mental condition  Migraine without aura and without status migrainosus, not intractable   30 min face to face time with patient was spent on counseling and coordination of care. We discussed Patient with a long history of bipolar disorder with rapid cycling that has been somewhat difficult to treat for a variety of reasons.  These include confounding migraine headaches which require a lot of medication and the fact that she is medication sensitive as well as having failed multiple medications.  In addition she refuses meds that can cause weight gain which greatly limit options. This is chronic  ongoing problem a She was informed of high relapse risk without mood stabilizer.  She is not currently confused.  She is not manic.  She has no psychosis.  There is no evidence of substance abuse. HA manageable.  Still dealing with some dep.   We discussed the short-term risks associated with benzodiazepines including sedation and increased fall risk among others.  Discussed long-term side effect risk including dependence, potential withdrawal symptoms, and the potential eventual dose-related risk of dementia.  But recent studies from 2020 dispute this association between benzodiazepines and dementia risk. Newer studies in 2020 do not support an association with dementia.  Disc this again with detail.  Disc try gradually reduce clonazepam .  Consider pramipexole for TRD.  Consider stimulant augmentation.  Or Auvelity.  I probably have an EDO, If I gain weight will have mood swings..  no interest in therapy for this.  Contingue clonazepam  Taking 1.5 mg HS. Try reducing if possible. Continue topiramate   For depression:  Continue  Vraylar 1.5 mg daily for longer trial for dep.  Partial benfit, more active. continue lamotrigine  to 1 in the AM and  PM She agrees.  FU 1  mos  Lorene Macintosh MD, DFAPA  Please see After Visit Summary for patient specific instructions.  Future Appointments  Date Time Provider Department Center  08/11/2024  3:30 PM Cottle, Lorene KANDICE Raddle., MD [facility] None          No orders of the defined types were placed in this  encounter.   -------------------------------

## 2024-08-11 ENCOUNTER — Ambulatory Visit: Admitting: Psychiatry
